# Patient Record
Sex: Male | Born: 1955 | Hispanic: No | Marital: Married | Smoking: Former smoker
Health system: Southern US, Community
[De-identification: ages and names within clinical notes are randomized; demographics above are authoritative.]

## PROBLEM LIST (undated history)

## (undated) DIAGNOSIS — K219 Gastro-esophageal reflux disease without esophagitis: Secondary | ICD-10-CM

## (undated) DIAGNOSIS — F32A Depression, unspecified: Secondary | ICD-10-CM

## (undated) DIAGNOSIS — T8859XA Other complications of anesthesia, initial encounter: Secondary | ICD-10-CM

## (undated) DIAGNOSIS — I6381 Other cerebral infarction due to occlusion or stenosis of small artery: Secondary | ICD-10-CM

## (undated) DIAGNOSIS — I618 Other nontraumatic intracerebral hemorrhage: Secondary | ICD-10-CM

## (undated) DIAGNOSIS — N2889 Other specified disorders of kidney and ureter: Secondary | ICD-10-CM

## (undated) DIAGNOSIS — F419 Anxiety disorder, unspecified: Secondary | ICD-10-CM

## (undated) DIAGNOSIS — F329 Major depressive disorder, single episode, unspecified: Secondary | ICD-10-CM

## (undated) DIAGNOSIS — E538 Deficiency of other specified B group vitamins: Secondary | ICD-10-CM

## (undated) DIAGNOSIS — T4145XA Adverse effect of unspecified anesthetic, initial encounter: Secondary | ICD-10-CM

## (undated) HISTORY — DX: Other nontraumatic intracerebral hemorrhage: I61.8

## (undated) HISTORY — PX: NECK SURGERY: SHX720

## (undated) HISTORY — DX: Deficiency of other specified B group vitamins: E53.8

## (undated) HISTORY — PX: COLONOSCOPY: SHX174

## (undated) HISTORY — DX: Other cerebral infarction due to occlusion or stenosis of small artery: I63.81

---
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
## 2019-07-18 ENCOUNTER — Other Ambulatory Visit: Payer: Self-pay

## 2019-07-18 ENCOUNTER — Ambulatory Visit: Payer: 59 | Admitting: Family Medicine

## 2019-07-18 ENCOUNTER — Encounter: Payer: Self-pay | Admitting: Family Medicine

## 2019-07-18 ENCOUNTER — Other Ambulatory Visit: Payer: 59

## 2019-07-18 ENCOUNTER — Other Ambulatory Visit: Payer: Self-pay | Admitting: *Deleted

## 2019-07-18 VITALS — BP 120/86 | HR 70 | Temp 97.0°F | Ht 72.0 in | Wt 180.0 lb

## 2019-07-18 DIAGNOSIS — E538 Deficiency of other specified B group vitamins: Secondary | ICD-10-CM

## 2019-07-18 DIAGNOSIS — D473 Essential (hemorrhagic) thrombocythemia: Secondary | ICD-10-CM

## 2019-07-18 DIAGNOSIS — R413 Other amnesia: Secondary | ICD-10-CM | POA: Diagnosis not present

## 2019-07-18 DIAGNOSIS — D75839 Thrombocytosis, unspecified: Secondary | ICD-10-CM

## 2019-07-18 DIAGNOSIS — F320 Major depressive disorder, single episode, mild: Secondary | ICD-10-CM | POA: Diagnosis not present

## 2019-07-18 DIAGNOSIS — R1310 Dysphagia, unspecified: Secondary | ICD-10-CM | POA: Diagnosis not present

## 2019-07-18 DIAGNOSIS — R1319 Other dysphagia: Secondary | ICD-10-CM

## 2019-07-18 LAB — CBC
HCT: 51.4 % (ref 39.0–52.0)
Hemoglobin: 16.5 g/dL (ref 13.0–17.0)
MCHC: 32 g/dL (ref 30.0–36.0)
MCV: 80 fl (ref 78.0–100.0)
Platelets: 912 10*3/uL — ABNORMAL HIGH (ref 150.0–400.0)
RBC: 6.43 Mil/uL — ABNORMAL HIGH (ref 4.22–5.81)
RDW: 13.5 % (ref 11.5–15.5)
WBC: 10.7 10*3/uL — ABNORMAL HIGH (ref 4.0–10.5)

## 2019-07-18 LAB — COMPREHENSIVE METABOLIC PANEL
ALT: 17 U/L (ref 0–53)
AST: 18 U/L (ref 0–37)
Albumin: 4.6 g/dL (ref 3.5–5.2)
Alkaline Phosphatase: 78 U/L (ref 39–117)
BUN: 19 mg/dL (ref 6–23)
CO2: 28 mEq/L (ref 19–32)
Calcium: 9.9 mg/dL (ref 8.4–10.5)
Chloride: 102 mEq/L (ref 96–112)
Creatinine, Ser: 1.01 mg/dL (ref 0.40–1.50)
GFR: 74.88 mL/min (ref >60.00)
Glucose, Bld: 85 mg/dL (ref 70–99)
Potassium: 4.8 mEq/L (ref 3.5–5.1)
Sodium: 139 mEq/L (ref 135–145)
Total Bilirubin: 0.6 mg/dL (ref 0.2–1.2)
Total Protein: 7.5 g/dL (ref 6.0–8.3)

## 2019-07-18 LAB — TSH: TSH: 0.55 u[IU]/mL (ref 0.35–4.50)

## 2019-07-18 LAB — T4, FREE: Free T4: 0.84 ng/dL (ref 0.60–1.60)

## 2019-07-18 LAB — VITAMIN B12: Vitamin B-12: 92 pg/mL — ABNORMAL LOW (ref 211–911)

## 2019-07-18 MED ORDER — SERTRALINE HCL 50 MG PO TABS: 50.0000 mg | ORAL_TABLET | Freq: Every day | ORAL | 3 refills | Status: DC

## 2019-07-18 NOTE — Progress Notes (Signed)
Chief Complaint  Patient presents with  . New Patient (Initial Visit)    memory problems and congestion       New Patient Visit SUBJECTIVE: HPI: Thomas Hancock is an 64 y.o.male who is being seen for establishing care.   2 years of progressive trouble swallowing in the lower neck area. Solid foods tend to get stuck. Liquids are OK. Has lost around 10-15 lbs since this started. Getting worse.   5-6 mo of memory concerns. 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 Have noticed an increase in irritability. Feeling more depressed. No hx of concussions/inj, EtOH use, drug use, former smoker. +famhx of this. No SI or HI. Stressors include work and the pandemic.   History reviewed. No pertinent past medical history.   History reviewed. No pertinent surgical history.   History reviewed. No pertinent family history. No Known Allergies  Current Outpatient Medications:  .  sertraline (ZOLOFT) 50 MG tablet, Take 1 tablet (50 mg total) by mouth daily. Take 1/2 tab daily for first 2 weeks., Disp: 30 tablet, Rfl: 3  ROS Const: Denies fevers  GI: +regurgitation   OBJECTIVE: BP 120/86 (BP Location: Left Arm, Patient Position: Sitting, Cuff Size: Normal)   Pulse 70   Temp (!) 97 F (36.1 C) (Temporal)   Ht 6' (1.829 m)   Wt 180 lb (81.6 kg)   SpO2 98%   BMI 24.41 kg/m  General:  well developed, well nourished, in no apparent distress GI: Abd S, NT, ND, BS+ Throat/Pharynx:  lips and gingiva without lesion; tongue and uvula midline; non-inflamed pharynx; no exudates or postnasal drainage Lungs:  clear to auscultation, breath sounds equal bilaterally, no respiratory distress Cardio:  regular rate and rhythm, no LE edema or bruits Musculoskeletal:  symmetrical muscle groups noted without atrophy or deformity Neuro:  gait normal Psych: well oriented with normal range of affect and appropriate  judgment/insight  ASSESSMENT/PLAN: Esophageal dysphagia - Plan: Ambulatory referral to Gastroenterology  Depression, major, single episode, mild (HCC) - Plan: sertraline (ZOLOFT) 50 MG tablet  Memory difficulty - Plan: CBC, Comprehensive metabolic panel, TSH, T4, free, B12, Ambulatory referral to Psychology  1- send to GI for possible scope and stretching.  2- Start SSRI. 25 mg/d for 2 weeks then 50 mg/d. Counseling info given. 3- Counseled on exercise. Refer to psychology for evaluation. Ck labs.  Patient should return in 6 weeks to reck. The patient and his spouse voiced understanding and agreement to the plan.   [place], DO 07/18/19  12:26 PM

## 2019-07-18 NOTE — Patient Instructions (Signed)
If you do not hear anything about your referrals in the next 1-2 weeks, call our office and ask for an update.  Keep the diet clean and stay active.  Aim to do some physical exertion for 150 minutes per week. This is typically divided into 5 days per week, 30 minutes per day. The activity should be enough to get your heart rate up. Anything is better than nothing if you have time constraints.  Please consider counseling. Contact [phone] to schedule an appointment or inquire about cost/insurance coverage.  Coping skills Choose 5 that work for you:  Take a deep breath  Count to 20  Read a book  Do a puzzle  Meditate  Bake  Sing  Knit  Garden  Pray  Go outside  Call a friend  Listen to music  Take a walk  Color  Send a note  Take a bath  Watch a movie  Be alone in a quiet place  Pet an animal  Visit a friend  Journal  Exercise  Stretch

## 2019-07-21 ENCOUNTER — Encounter: Payer: Self-pay | Admitting: Gastroenterology

## 2019-07-21 LAB — INTRINSIC FACTOR ANTIBODIES: Intrinsic Factor: UNDETERMINED — AB

## 2019-07-21 LAB — CBC (INCLUDES DIFF/PLT) WITH PATHOLOGIST REVIEW
Absolute Monocytes: 813 cells/uL (ref 200–950)
Basophils Absolute: 161 cells/uL (ref 0–200)
Basophils Relative: 1.5 %
Eosinophils Absolute: 375 cells/uL (ref 15–500)
Eosinophils Relative: 3.5 %
HCT: 50.3 % — ABNORMAL HIGH (ref 38.5–50.0)
Hemoglobin: 16.7 g/dL (ref 13.2–17.1)
Lymphs Abs: 1626 cells/uL (ref 850–3900)
MCH: 26 pg — ABNORMAL LOW (ref 27.0–33.0)
MCHC: 33.2 g/dL (ref 32.0–36.0)
MCV: 78.3 fL — ABNORMAL LOW (ref 80.0–100.0)
MPV: 8.8 fL (ref 7.5–12.5)
Monocytes Relative: 7.6 %
Neutro Abs: 7725 cells/uL (ref 1500–7800)
Neutrophils Relative %: 72.2 %
Platelets: 858 10*3/uL — ABNORMAL HIGH (ref 140–400)
RBC: 6.42 10*6/uL — ABNORMAL HIGH (ref 4.20–5.80)
RDW: 12.8 % (ref 11.0–15.0)
Total Lymphocyte: 15.2 %
WBC: 10.7 10*3/uL (ref 3.8–10.8)

## 2019-07-22 ENCOUNTER — Other Ambulatory Visit: Payer: Self-pay | Admitting: Family Medicine

## 2019-07-22 ENCOUNTER — Encounter: Payer: Self-pay | Admitting: Family Medicine

## 2019-07-22 DIAGNOSIS — D75839 Thrombocytosis, unspecified: Secondary | ICD-10-CM

## 2019-07-22 DIAGNOSIS — D473 Essential (hemorrhagic) thrombocythemia: Secondary | ICD-10-CM

## 2019-07-24 ENCOUNTER — Telehealth: Payer: Self-pay | Admitting: Hematology

## 2019-07-24 ENCOUNTER — Ambulatory Visit (INDEPENDENT_AMBULATORY_CARE_PROVIDER_SITE_OTHER): Payer: 59 | Admitting: *Deleted

## 2019-07-24 ENCOUNTER — Telehealth: Payer: Self-pay | Admitting: *Deleted

## 2019-07-24 ENCOUNTER — Other Ambulatory Visit: Payer: Self-pay

## 2019-07-24 DIAGNOSIS — E538 Deficiency of other specified B group vitamins: Secondary | ICD-10-CM

## 2019-07-24 MED ORDER — CYANOCOBALAMIN 1000 MCG/ML IJ SOLN
1000.0000 ug | Freq: Once | INTRAMUSCULAR | Status: AC
  Administered 2019-07-24: 10:00:00 1000 ug via INTRAMUSCULAR

## 2019-07-24 NOTE — Telephone Encounter (Signed)
For patient next b12 did you want him to come weekly for 4 weeks and then monthly?

## 2019-07-24 NOTE — Progress Notes (Signed)
Patient here for b12 injection per pcp.  Injection given in left deltoid and patient tolerated well.

## 2019-07-24 NOTE — Telephone Encounter (Signed)
Tentatively yes, will need to reck B12 after his 4th shot. Ty.

## 2019-07-24 NOTE — Telephone Encounter (Signed)
lmom to inform patient of new patient appointment 2/11 at 1030 am

## 2019-07-24 NOTE — Telephone Encounter (Signed)
Patient notified

## 2019-07-25 ENCOUNTER — Encounter: Payer: Self-pay | Admitting: Gastroenterology

## 2019-07-25 ENCOUNTER — Ambulatory Visit: Payer: 59 | Admitting: Gastroenterology

## 2019-07-25 VITALS — BP 134/86 | HR 74 | Temp 98.0°F | Ht 72.0 in | Wt 183.1 lb

## 2019-07-25 DIAGNOSIS — Z01818 Encounter for other preprocedural examination: Secondary | ICD-10-CM

## 2019-07-25 DIAGNOSIS — R1310 Dysphagia, unspecified: Secondary | ICD-10-CM | POA: Diagnosis not present

## 2019-07-25 DIAGNOSIS — Z1211 Encounter for screening for malignant neoplasm of colon: Secondary | ICD-10-CM

## 2019-07-25 DIAGNOSIS — R1319 Other dysphagia: Secondary | ICD-10-CM

## 2019-07-25 MED ORDER — CLENPIQ 10-3.5-12 MG-GM -GM/160ML PO SOLN: 1.0000 | Freq: Once | ORAL | 0 refills | Status: AC

## 2019-07-25 NOTE — Addendum Note (Signed)
Addended by: Kem Boroughs D on: 07/25/2019 10:15 AM   Modules accepted: Orders

## 2019-07-25 NOTE — Patient Instructions (Signed)
If you are age 62 or older, your body mass index should be between 23-30. Your Body mass index is 24.84 kg/m. If this is out of the aforementioned range listed, please consider follow up with your Primary Care Provider.  If you are age 12 or younger, your body mass index should be between 19-25. Your Body mass index is 24.84 kg/m. If this is out of the aformentioned range listed, please consider follow up with your Primary Care Provider.   You have been scheduled for an endoscopy and colonoscopy. Please follow the written instructions given to you at your visit today. Please pick up your prep supplies at the pharmacy within the next 1-3 days. If you use inhalers (even only as needed), please bring them with you on the day of your procedure. Your physician has requested that you go to www.startemmi.com and enter the access code given to you at your visit today. This web site gives a general overview about your procedure. However, you should still follow specific instructions given to you by our office regarding your preparation for the procedure.  We have given you samples of the following medication to take: Clenpiq  Thank you,  Dr. Jackquline Denmark

## 2019-07-25 NOTE — Progress Notes (Signed)
Chief Complaint:   Referring Provider:  Shelda Pal*      ASSESSMENT AND PLAN;   #1. Eso dysphagia. D/d includes eso stricture, Schatzki's ring, motility disorder, eosinophilic esophagitis, pill induced esophagitis, r/o esophageal Ca or extrinsic lesions.  #2. Colorectal cancer screening  #3. B12 def on B12 shots, thrombocytosis, low MCV ?  Etiology-has appointment with hematology.  Plan: - Proceed with EGD with dil (if needed)/colon. Discussed risks & benefits. (Risks including rare perforation req laparotomy, bleeding after bx/polypectomy req blood transfusion, rarely missing neoplasms, risks of anesthesia/sedation). Benefits outweigh the risks. Patient agrees to proceed. All the questions were answered. Consent forms given for review. - Continue B12 shots for now. - Encouraged him to keep hematology appointment.   HPI:    Thomas Hancock is a 64 y.o. male  Accompanied by his wife Has been having dysphagia mostly to solids, mid chest, x 6 to 8 years, getting somewhat worse over the last 6 months.  Would always have cough and would bring up phlegm after eating.  This is a problem with the whole family-including his brother and father. No heartburn No odynophagia No weight loss  Denies having any diarrhea or constipation.  No fever chills or night sweats.  Has never been to a doctor in 39 years.  Was seen by Dr. Nani Ravens to establish care.  Labs revealed mildly increased WBC count at 10.7, platelets 912K with normal hemoglobin at 16.5.  Normal CMP.  Had B12 deficiency.  Has appointment with hematology to rule out myeloproliferative disorder.  MCV 80.  Repeat CBC revealed MCV 78.3.  Platelet count was still elevated at 858 K.  LFTs were normal with normal albumin 4.6.  For now has been advised to get EGD and colonoscopy performed    Family History  Problem Relation Age of Onset  . Colon cancer Neg Hx   . Esophageal cancer Neg Hx     Social History    Tobacco Use  . Smoking status: Former Smoker    Types: Cigarettes  . Smokeless tobacco: Never Used  . Tobacco comment: quit over 20 years ago  Substance Use Topics  . Alcohol use: Never  . Drug use: Never    Current Outpatient Medications  Medication Sig Dispense Refill  . sertraline (ZOLOFT) 50 MG tablet Take 1 tablet (50 mg total) by mouth daily. Take 1/2 tab daily for first 2 weeks. 30 tablet 3   No current facility-administered medications for this visit.    No Known Allergies  Review of Systems:  Constitutional: Denies fever, chills, diaphoresis, appetite change and fatigue.  HEENT: Denies photophobia, eye pain, redness, hearing loss, ear pain, congestion, sore throat, rhinorrhea, sneezing, mouth sores, neck pain, neck stiffness and tinnitus.   Respiratory: Denies SOB, DOE, cough, chest tightness,  and wheezing.   Cardiovascular: Denies chest pain, palpitations and leg swelling.  Genitourinary: Denies dysuria, urgency, frequency, hematuria, flank pain and difficulty urinating.  Musculoskeletal: Denies myalgias, back pain, joint swelling, arthralgias and gait problem.  Skin: No rash.  Neurological: Denies dizziness, seizures, syncope, weakness, light-headedness, numbness and headaches.  Hematological: Denies adenopathy. Easy bruising, personal or family bleeding history  Psychiatric/Behavioral: Has anxiety or depression     Physical Exam:    BP 134/86   Pulse 74   Temp 98 F (36.7 C)   Ht 6' (1.829 m)   Wt 183 lb 2 oz (83.1 kg)   BMI 24.84 kg/m  Wt Readings from Last 3 Encounters:  07/25/19 183  lb 2 oz (83.1 kg)  07/18/19 180 lb (81.6 kg)   Constitutional:  Well-developed, in no acute distress. Psychiatric: Normal mood and affect. Behavior is normal. HEENT: Pupils normal.  Conjunctivae are normal. No scleral icterus. Neck supple.  Cardiovascular: Normal rate, regular rhythm. No edema Pulmonary/chest: Effort normal and breath sounds normal. No wheezing,  rales or rhonchi. Abdominal: Soft, nondistended. Nontender. Bowel sounds active throughout. There are no masses palpable. No hepatomegaly. Rectal:  defered Neurological: Alert and oriented to person place and time. Skin: Skin is warm and dry. No rashes noted.  Data Reviewed: I have personally reviewed following labs and imaging studies  CBC: CBC Latest Ref Rng & Units 07/18/2019 07/18/2019  WBC 3.8 - 10.8 Thousand/uL 10.7 10.7(H)  Hemoglobin 13.2 - 17.1 g/dL 16.7 16.5  Hematocrit 38.5 - 50.0 % 50.3(H) 51.4  Platelets 140 - 400 Thousand/uL 858(H) 912.0(H)    CMP: CMP Latest Ref Rng & Units 07/18/2019  Glucose 70 - 99 mg/dL 85  BUN 6 - 23 mg/dL 19  Creatinine 0.40 - 1.50 mg/dL 1.01  Sodium 135 - 145 mEq/L 139  Potassium 3.5 - 5.1 mEq/L 4.8  Chloride 96 - 112 mEq/L 102  CO2 19 - 32 mEq/L 28  Calcium 8.4 - 10.5 mg/dL 9.9  Total Protein 6.0 - 8.3 g/dL 7.5  Total Bilirubin 0.2 - 1.2 mg/dL 0.6  Alkaline Phos 39 - 117 U/L 78  AST 0 - 37 U/L 18  ALT 0 - 53 U/L 17    GFR: Estimated Creatinine Clearance: 84.3 mL/min (by C-G formula based on SCr of 1.01 mg/dL). Liver Function Tests: Recent Labs  Lab 07/18/19 1340  AST 18  ALT 17  ALKPHOS 78  BILITOT 0.6  PROT 7.5  ALBUMIN 4.6      Carmell Austria, MD 07/25/2019, 9:13 AM  Cc: Shelda Pal*

## 2019-07-25 NOTE — Addendum Note (Signed)
Addended by: Kem Boroughs D on: 07/25/2019 10:11 AM   Modules accepted: Orders

## 2019-07-28 HISTORY — PX: COLONOSCOPY WITH ESOPHAGOGASTRODUODENOSCOPY (EGD): SHX5779

## 2019-07-29 ENCOUNTER — Ambulatory Visit (INDEPENDENT_AMBULATORY_CARE_PROVIDER_SITE_OTHER): Payer: 59

## 2019-07-29 ENCOUNTER — Other Ambulatory Visit: Payer: Self-pay

## 2019-07-29 DIAGNOSIS — Z1159 Encounter for screening for other viral diseases: Secondary | ICD-10-CM

## 2019-07-31 ENCOUNTER — Other Ambulatory Visit: Payer: Self-pay

## 2019-07-31 ENCOUNTER — Encounter: Payer: Self-pay | Admitting: Gastroenterology

## 2019-07-31 ENCOUNTER — Ambulatory Visit (INDEPENDENT_AMBULATORY_CARE_PROVIDER_SITE_OTHER): Payer: 59

## 2019-07-31 ENCOUNTER — Ambulatory Visit (AMBULATORY_SURGERY_CENTER): Payer: 59 | Admitting: Gastroenterology

## 2019-07-31 VITALS — BP 129/87 | HR 67 | Temp 97.8°F | Resp 14 | Ht 72.0 in | Wt 183.0 lb

## 2019-07-31 DIAGNOSIS — D124 Benign neoplasm of descending colon: Secondary | ICD-10-CM | POA: Diagnosis not present

## 2019-07-31 DIAGNOSIS — Z1211 Encounter for screening for malignant neoplasm of colon: Secondary | ICD-10-CM | POA: Diagnosis not present

## 2019-07-31 DIAGNOSIS — D123 Benign neoplasm of transverse colon: Secondary | ICD-10-CM | POA: Diagnosis not present

## 2019-07-31 DIAGNOSIS — K219 Gastro-esophageal reflux disease without esophagitis: Secondary | ICD-10-CM | POA: Diagnosis not present

## 2019-07-31 DIAGNOSIS — R1310 Dysphagia, unspecified: Secondary | ICD-10-CM | POA: Diagnosis present

## 2019-07-31 DIAGNOSIS — E538 Deficiency of other specified B group vitamins: Secondary | ICD-10-CM

## 2019-07-31 DIAGNOSIS — K2950 Unspecified chronic gastritis without bleeding: Secondary | ICD-10-CM | POA: Diagnosis not present

## 2019-07-31 DIAGNOSIS — B9681 Helicobacter pylori [H. pylori] as the cause of diseases classified elsewhere: Secondary | ICD-10-CM

## 2019-07-31 DIAGNOSIS — K2970 Gastritis, unspecified, without bleeding: Secondary | ICD-10-CM | POA: Diagnosis not present

## 2019-07-31 MED ORDER — PANTOPRAZOLE SODIUM 40 MG PO TBEC: 40.0000 mg | DELAYED_RELEASE_TABLET | Freq: Every day | ORAL | 3 refills | Status: DC

## 2019-07-31 MED ORDER — SODIUM CHLORIDE 0.9 % IV SOLN: 500.0000 mL | Freq: Once | INTRAVENOUS | Status: DC

## 2019-07-31 MED ORDER — CYANOCOBALAMIN 1000 MCG/ML IJ SOLN
1000.0000 ug | Freq: Once | INTRAMUSCULAR | Status: AC
  Administered 2019-07-31: 1000 ug via INTRAMUSCULAR

## 2019-07-31 NOTE — Patient Instructions (Signed)
HANDOUTS PROVIDED ON: POST DILATION DIET, GASTRITIS, POLYPS, DIVERTICULOSIS, & HEMORRHOIDS  The polyps removed/biopsies taken today have been sent for pathology.  The results can take 1-3 weeks to receive.  When your next colonoscopy should occur will be based on the pathology results.    Follow the post dilation diet today and you may resume your previous diet tomorrow.  You may resume your medication schedule.  Begin taking Protonix (pantoprazole) 40mg  every day.  NO ASPIRIN, IBUPROFEN  Thank you for allowing [place] to care for you today!!!   YOU HAD AN ENDOSCOPIC PROCEDURE TODAY AT [facility]:   Refer to the procedure report that was given to you for any specific questions about what was found during the examination.  If the procedure report does not answer your questions, please call your gastroenterologist to clarify.  If you requested that your care partner not be given the details of your procedure findings, then the procedure report has been included in a sealed envelope for you to review at your convenience later.  YOU SHOULD EXPECT: Some feelings of bloating in the abdomen. Passage of more gas than usual.  Walking can help get rid of the air that was put into your GI tract during the procedure and reduce the bloating. If you had a lower endoscopy (such as a colonoscopy or flexible sigmoidoscopy) you may notice spotting of blood in your stool or on the toilet paper. If you underwent a bowel prep for your procedure, you may not have a normal bowel movement for a few days.  Please Note:  You might notice some irritation and congestion in your nose or some drainage.  This is from the oxygen used during your procedure.  There is no need for concern and it should clear up in a day or so.  SYMPTOMS TO REPORT IMMEDIATELY:   Following lower endoscopy (colonoscopy or flexible sigmoidoscopy):  Excessive amounts of blood in the stool  Significant tenderness or worsening of  abdominal pains  Swelling of the abdomen that is new, acute  Fever of 100F or higher   Following upper endoscopy (EGD)  Vomiting of blood or coffee ground material  New chest pain or pain under the shoulder blades  Painful or persistently difficult swallowing  New shortness of breath  Fever of 100F or higher  Black, tarry-looking stools  For urgent or emergent issues, a gastroenterologist can be reached at any hour by calling [phone].   DIET:  We do recommend you follow the post dilation diet for today and tomorrow you can start with a small meal at first, but then you may proceed to your regular diet.  Drink plenty of fluids but you should avoid alcoholic beverages for 24 hours.  ACTIVITY:  You should plan to take it easy for the rest of today and you should NOT DRIVE or use heavy machinery until tomorrow (because of the sedation medicines used during the test).    FOLLOW UP: Our staff will call the number listed on your records 48-72 hours following your procedure to check on you and address any questions or concerns that you may have regarding the information given to you following your procedure. If we do not reach you, we will leave a message.  We will attempt to reach you two times.  During this call, we will ask if you have developed any symptoms of COVID 19. If you develop any symptoms (ie: fever, flu-like symptoms, shortness of breath, cough etc.) before then,  please call [phone].  If you test positive for Covid 19 in the 2 weeks post procedure, please call and report this information to [place].    If any biopsies were taken you will be contacted by phone or by letter within the next 1-3 weeks.  Please call us at [phone] if you have not heard about the biopsies in 3 weeks.    SIGNATURES/CONFIDENTIALITY: You and/or your care partner have signed paperwork which will be entered into your electronic medical record.  These signatures attest to the fact that that the  information above on your After Visit Summary has been reviewed and is understood.  Full responsibility of the confidentiality of this discharge information lies with you and/or your care-partner.

## 2019-07-31 NOTE — Op Note (Signed)
[facility] Patient Name: Thomas Hancock Procedure Date: 07/31/2019 1:18 PM MRN: [id number] Endoscopist: Jackquline Denmark , MD Age: 64 Referring MD:  Date of Birth: January 31, 1956 Gender: Male Account #: [id number] Procedure:                Colonoscopy Indications:              Screening for colorectal malignant neoplasm Medicines:                Monitored Anesthesia Care Procedure:                Pre-Anesthesia Assessment:                           - Prior to the procedure, a History and Physical                            was performed, and patient medications and                            allergies were reviewed. The patient's tolerance of                            previous anesthesia was also reviewed. The risks                            and benefits of the procedure and the sedation                            options and risks were discussed with the patient.                            All questions were answered, and informed consent                            was obtained. Prior Anticoagulants: The patient has                            taken no previous anticoagulant or antiplatelet                            agents. ASA Grade Assessment: II - A patient with                            mild systemic disease. After reviewing the risks                            and benefits, the patient was deemed in                            satisfactory condition to undergo the procedure.                           After obtaining informed consent, the colonoscope  was passed under direct vision. Throughout the                            procedure, the patient's blood pressure, pulse, and                            oxygen saturations were monitored continuously. The                            Colonoscope was introduced through the anus and                            advanced to the 2 cm into the ileum. The                            colonoscopy was performed  without difficulty. The                            patient tolerated the procedure well. The quality                            of the bowel preparation was good except in the                            right colon where there was adherent stool which                            could not be fully washed. Overall the examination                            was adequate. The terminal ileum, ileocecal valve,                            appendiceal orifice, and rectum were photographed. Scope In: 1:31:50 PM Scope Out: 1:47:46 PM Scope Withdrawal Time: 0 hours 13 minutes 50 seconds  Total Procedure Duration: 0 hours 15 minutes 56 seconds  Findings:                 Five sessile polyps were found in the sigmoid colon                            (1), mid-descending colon(2) and mid transverse                            colon(2). The polyps were 4 to 6 mm in size. These                            polyps were removed with a cold snare. Resection                            and retrieval were complete. Estimated blood loss:  none.                           A 10 mm polyp was found in the mid sigmoid colon,                            35 cm from the anal verge. The polyp was                            semi-pedunculated. The polyp was removed with a hot                            snare. Resection and retrieval were complete.                           A few small-mouthed diverticula were found in the                            sigmoid colon, descending colon and ascending colon.                           Non-bleeding internal hemorrhoids were found during                            retroflexion. The hemorrhoids were moderate.                           The terminal ileum appeared normal.                           The exam was otherwise without abnormality on                            direct and retroflexion views. Complications:            No immediate complications. Estimated  Blood Loss:     Estimated blood loss: none. Impression:               -Colonic polyps s/p polypectomy.                           -Mild pancolonic diverticulosis.                           -Non-bleeding internal hemorrhoids.                           -Otherwise normal colonoscopy to TI. Recommendation:           - Patient has a contact number available for                            emergencies. The signs and symptoms of potential                            delayed complications were discussed with the  patient. Return to normal activities tomorrow.                            Written discharge instructions were provided to the                            patient.                           - Resume previous diet.                           - Continue present medications.                           - Await pathology results.                           - Repeat colonoscopy for surveillance based on                            pathology results.                           - The findings and recommendations were discussed                            with the patient's family. Jackquline Denmark, MD 07/31/2019 2:17:15 PM This report has been signed electronically.

## 2019-07-31 NOTE — Op Note (Signed)
[facility] Patient Name: Thomas Hancock Procedure Date: 07/31/2019 1:18 PM MRN: [id number] Endoscopist: Jackquline Denmark , MD Age: 64 Referring MD:  Date of Birth: Feb 20, 1956 Gender: Male Account #: [id number] Procedure:                Upper GI endoscopy Indications:              Epigastric abdominal pain Medicines:                Monitored Anesthesia Care Procedure:                Pre-Anesthesia Assessment:                           - Prior to the procedure, a History and Physical                            was performed, and patient medications and                            allergies were reviewed. The patient's tolerance of                            previous anesthesia was also reviewed. The risks                            and benefits of the procedure and the sedation                            options and risks were discussed with the patient.                            All questions were answered, and informed consent                            was obtained. Prior Anticoagulants: The patient has                            taken no previous anticoagulant or antiplatelet                            agents. ASA Grade Assessment: II - A patient with                            mild systemic disease. After reviewing the risks                            and benefits, the patient was deemed in                            satisfactory condition to undergo the procedure.                           After obtaining informed consent, the endoscope was  passed under direct vision. Throughout the                            procedure, the patient's blood pressure, pulse, and                            oxygen saturations were monitored continuously. The                            Endoscope was introduced through the mouth, and                            advanced to the second part of duodenum. The upper                            GI endoscopy was accomplished  without difficulty.                            The patient tolerated the procedure well. Scope In: Scope Out: Findings:                 The examined esophagus was normal. Z-line was                            well-defined at 36 cm. Examined by NBI. Multiple                            biopsies were obtained from proximal, mid and                            distal esophagus to rule out EoE. Due to presence                            of dysphagia, we elected to dilate the esophagus.                            Esophagus was dilated using a Dilkon.                            The patient tolerated the procedure well.                           Diffuse moderate inflammation characterized by                            erythema with friability was found throughout the                            stomach, most prominent in gastric body and in the                            gastric antrum. Biopsies were taken with a cold  forceps for histology.                           The examined duodenum was normal. Biopsies for                            histology were taken with a cold forceps for                            evaluation of celiac disease. Complications:            No immediate complications. Estimated Blood Loss:     Estimated blood loss: none. Impression:               -Moderate gastritis.                           -S/P empiric esophageal dilatation. Recommendation:           - Patient has a contact number available for                            emergencies. The signs and symptoms of potential                            delayed complications were discussed with the                            patient. Return to normal activities tomorrow.                            Written discharge instructions were provided to the                            patient.                           - Post dilatation diet.                           - Continue present  medications.                           - No aspirin, ibuprofen, naproxen, or other                            non-steroidal anti-inflammatory drugs.                           - Await pathology results.                           - Use Protonix (pantoprazole) 40 mg PO daily, 30,                            with 6 refills.                           -  Return to GI office in 8 weeks. Jackquline Denmark, MD 07/31/2019 2:06:00 PM This report has been signed electronically.

## 2019-07-31 NOTE — Progress Notes (Signed)
A and O x3. Report to RN. Tolerated MAC anesthesia well.Teeth unchanged after procedure.

## 2019-08-04 ENCOUNTER — Telehealth: Payer: Self-pay

## 2019-08-04 NOTE — Telephone Encounter (Signed)
First post procedure follow up procedure, no answer 

## 2019-08-04 NOTE — Telephone Encounter (Signed)
  Follow up Call-  Call back number 07/31/2019  Post procedure Call Back phone  # [phone]  Permission to leave phone message Yes     Patient questions:  Do you have a fever, pain , or abdominal swelling? No. Pain Score  0 *  Have you tolerated food without any problems? Yes.    Have you been able to return to your normal activities? Yes.    Do you have any questions about your discharge instructions: Diet   No. Medications  No. Follow up visit  No.  Do you have questions or concerns about your Care? No.  Actions: * If pain score is 4 or above: 1. No action needed, pain <4.Have you developed a fever since your procedure? no  2.   Have you had an respiratory symptoms (SOB or cough) since your procedure? no  3.   Have you tested positive for COVID 19 since your procedure no  4.   Have you had any family members/close contacts diagnosed with the COVID 19 since your procedure?  no   If yes to any of these questions please route to Joylene John, RN and Alphonsa Gin, Therapist, sports.

## 2019-08-06 ENCOUNTER — Other Ambulatory Visit: Payer: Self-pay | Admitting: Family

## 2019-08-06 DIAGNOSIS — D473 Essential (hemorrhagic) thrombocythemia: Secondary | ICD-10-CM

## 2019-08-06 DIAGNOSIS — D75839 Thrombocytosis, unspecified: Secondary | ICD-10-CM

## 2019-08-06 DIAGNOSIS — Z1589 Genetic susceptibility to other disease: Secondary | ICD-10-CM

## 2019-08-07 ENCOUNTER — Inpatient Hospital Stay: Payer: 59 | Attending: Family | Admitting: Family

## 2019-08-07 ENCOUNTER — Ambulatory Visit (INDEPENDENT_AMBULATORY_CARE_PROVIDER_SITE_OTHER): Payer: 59

## 2019-08-07 ENCOUNTER — Encounter: Payer: Self-pay | Admitting: Family

## 2019-08-07 ENCOUNTER — Inpatient Hospital Stay: Payer: 59

## 2019-08-07 ENCOUNTER — Encounter: Payer: Self-pay | Admitting: Gastroenterology

## 2019-08-07 ENCOUNTER — Other Ambulatory Visit: Payer: Self-pay

## 2019-08-07 VITALS — BP 123/79 | HR 73 | Temp 97.3°F | Resp 18 | Ht 72.0 in | Wt 179.0 lb

## 2019-08-07 DIAGNOSIS — Z79899 Other long term (current) drug therapy: Secondary | ICD-10-CM | POA: Insufficient documentation

## 2019-08-07 DIAGNOSIS — E538 Deficiency of other specified B group vitamins: Secondary | ICD-10-CM

## 2019-08-07 DIAGNOSIS — Z1589 Genetic susceptibility to other disease: Secondary | ICD-10-CM

## 2019-08-07 DIAGNOSIS — R161 Splenomegaly, not elsewhere classified: Secondary | ICD-10-CM

## 2019-08-07 DIAGNOSIS — R7989 Other specified abnormal findings of blood chemistry: Secondary | ICD-10-CM | POA: Insufficient documentation

## 2019-08-07 DIAGNOSIS — D509 Iron deficiency anemia, unspecified: Secondary | ICD-10-CM

## 2019-08-07 DIAGNOSIS — D75839 Thrombocytosis, unspecified: Secondary | ICD-10-CM

## 2019-08-07 DIAGNOSIS — F17210 Nicotine dependence, cigarettes, uncomplicated: Secondary | ICD-10-CM | POA: Diagnosis not present

## 2019-08-07 DIAGNOSIS — D45 Polycythemia vera: Secondary | ICD-10-CM | POA: Diagnosis present

## 2019-08-07 DIAGNOSIS — K7460 Unspecified cirrhosis of liver: Secondary | ICD-10-CM | POA: Diagnosis not present

## 2019-08-07 DIAGNOSIS — D473 Essential (hemorrhagic) thrombocythemia: Secondary | ICD-10-CM

## 2019-08-07 LAB — CBC WITH DIFFERENTIAL (CANCER CENTER ONLY)
Abs Immature Granulocytes: 0.08 10*3/uL — ABNORMAL HIGH (ref 0.00–0.07)
Basophils Absolute: 0.2 10*3/uL — ABNORMAL HIGH (ref 0.0–0.1)
Basophils Relative: 1 %
Eosinophils Absolute: 0.4 10*3/uL (ref 0.0–0.5)
Eosinophils Relative: 3 %
HCT: 53 % — ABNORMAL HIGH (ref 39.0–52.0)
Hemoglobin: 16.9 g/dL (ref 13.0–17.0)
Immature Granulocytes: 1 %
Lymphocytes Relative: 10 %
Lymphs Abs: 1.2 10*3/uL (ref 0.7–4.0)
MCH: 25.3 pg — ABNORMAL LOW (ref 26.0–34.0)
MCHC: 31.9 g/dL (ref 30.0–36.0)
MCV: 79.2 fL — ABNORMAL LOW (ref 80.0–100.0)
Monocytes Absolute: 0.8 10*3/uL (ref 0.1–1.0)
Monocytes Relative: 6 %
Neutro Abs: 9.3 10*3/uL — ABNORMAL HIGH (ref 1.7–7.7)
Neutrophils Relative %: 79 %
Platelet Count: 839 10*3/uL — ABNORMAL HIGH (ref 150–400)
RBC: 6.69 MIL/uL — ABNORMAL HIGH (ref 4.22–5.81)
RDW: 13.4 % (ref 11.5–15.5)
WBC Count: 11.8 10*3/uL — ABNORMAL HIGH (ref 4.0–10.5)
nRBC: 0 % (ref 0.0–0.2)

## 2019-08-07 LAB — RETICULOCYTES
Immature Retic Fract: 6 % (ref 2.3–15.9)
RBC.: 6.69 MIL/uL — ABNORMAL HIGH (ref 4.22–5.81)
Retic Count, Absolute: 64.9 10*3/uL (ref 19.0–186.0)
Retic Ct Pct: 1 % (ref 0.4–3.1)

## 2019-08-07 LAB — CMP (CANCER CENTER ONLY)
ALT: 15 U/L (ref 0–44)
AST: 15 U/L (ref 15–41)
Albumin: 4.6 g/dL (ref 3.5–5.0)
Alkaline Phosphatase: 71 U/L (ref 38–126)
Anion gap: 8 (ref 5–15)
BUN: 18 mg/dL (ref 8–23)
CO2: 28 mmol/L (ref 22–32)
Calcium: 9.3 mg/dL (ref 8.9–10.3)
Chloride: 105 mmol/L (ref 98–111)
Creatinine: 1 mg/dL (ref 0.61–1.24)
GFR, Est AFR Am: >60 mL/min (ref >60)
GFR, Estimated: >60 mL/min (ref >60)
Glucose, Bld: 127 mg/dL — ABNORMAL HIGH (ref 70–99)
Potassium: 3.7 mmol/L (ref 3.5–5.1)
Sodium: 141 mmol/L (ref 135–145)
Total Bilirubin: 0.7 mg/dL (ref 0.3–1.2)
Total Protein: 7.4 g/dL (ref 6.5–8.1)

## 2019-08-07 LAB — LACTATE DEHYDROGENASE: LDH: 245 U/L — ABNORMAL HIGH (ref 98–192)

## 2019-08-07 LAB — SAVE SMEAR(SSMR), FOR PROVIDER SLIDE REVIEW

## 2019-08-07 MED ORDER — CYANOCOBALAMIN 1000 MCG/ML IJ SOLN
1000.0000 ug | Freq: Once | INTRAMUSCULAR | Status: AC
  Administered 2019-08-07: 1000 ug via INTRAMUSCULAR

## 2019-08-07 NOTE — Progress Notes (Signed)
Bre can you let the patient know gastric biopsies are positive for H pylori. Would recommend the following treatment regimen for 14 days: Amoxicillin 1gm BID Clarithromycin 500mg  BID Flagyl 500mg  BID Protonix 40 mg BID, then continue Protonix 40 mg p.o. once a day for 2 more weeks.  Can you please order this if no signfiicant interactions noted. Once done with therapy, the patient should wait one month and perform a stool study for H pylori antigen to ensure negative. The PPI needs to be held at least 2 weeks prior to submitting the stool sample. The patient should avoid alcohol while taking Flagyl.   Thanks  Dr Lyndel Safe

## 2019-08-07 NOTE — Progress Notes (Signed)
Hematology/Oncology Consultation   Name: Thomas Hancock      MRN: [id number]    Location: Room/bed info not found  Date: 08/07/2019 Time:11:24 AM   REFERRING PHYSICIAN:  Hart Carwin P. Wendling, DO  REASON FOR CONSULT: Thrombocytosis    DIAGNOSIS: Polycythemia, JAK-2 pending  HISTORY OF PRESENT ILLNESS: Thomas Hancock is a very pleasant 64 yo [name] who, until the last month or so, had never been to the doctor. His wife is with him today.  He had lab work in January that revealed elevated WBC count (10.7), Hgb (16.5) and platelets (912).  Platelet count today is 839, WBC count 11.8 and Hgb 16.9.  No fatigue at this time.  He states that he does have some lower back  And right hip pain that comes and goes.  He states that a paternal uncle had "blood cancer". No other cancer history in the family.  He quit smoking 2-3 cigarettes a day over 20 years ago.  He does not drink alcohol or use recreational drugs.  He started Zoloft for anxiety and depression several weeks ago and states that he has not seen much of a difference yet.  He had an endoscopy and colonoscopy last week. He had been having difficulty swallowing and they were able to dilate his esophagus. He had a benign polyp removed and was found to have chronic gastritis with H. Pylori. He started Protonix daily this week.  No fever, chills, n/v, cough, rash, dizziness, SOB, chest pain, palpitations, abdominal pain or changes in bowel or bladder habits.  No lymphadenopathy noted on exam.  No episodes of bleeding. No bruising or petechiae.  No swelling, tenderness, numbness or tingling in his extremities.  No falls or syncope.  He has maintained a good appetite and is staying well hydrated. His weight is stable.  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  As mentioned above, he is originally from [place] and has lived in the [place] now for more than 20 years.   ROS: All other 10 point review of systems is negative.    PAST MEDICAL HISTORY:   Past Medical History:  Diagnosis Date  . Vitamin B12 deficiency     ALLERGIES: No Known Allergies    MEDICATIONS:  Current Outpatient Medications on File Prior to Visit  Medication Sig Dispense Refill  . acetaminophen (TYLENOL) 325 MG tablet Take 650 mg by mouth every 6 (six) hours as needed.    . pantoprazole (PROTONIX) 40 MG tablet Take 1 tablet (40 mg total) by mouth daily. 30 tablet 3  . sertraline (ZOLOFT) 50 MG tablet Take 1 tablet (50 mg total) by mouth daily. Take 1/2 tab daily for first 2 weeks. 30 tablet 3   No current facility-administered medications on file prior to visit.     PAST SURGICAL HISTORY No past surgical history on file.  FAMILY HISTORY: Family History  Problem Relation Age of Onset  . Colon cancer Neg Hx   . Esophageal cancer Neg Hx     SOCIAL HISTORY:  reports that he has quit smoking. His smoking use included cigarettes. He has never used smokeless tobacco. He reports that he does not drink alcohol or use drugs.  PERFORMANCE STATUS: The patient's performance status is 1 - Symptomatic but completely ambulatory  PHYSICAL EXAM: Most Recent Vital Signs: Blood pressure 123/79, pulse 73, temperature (!) 97.3 F (36.3 C), temperature source Temporal, resp. rate 18, height 6' (1.829 m), weight 179 lb (81.2 kg), SpO2  100 %. BP 123/79 (BP Location: Left Arm, Patient Position: Sitting)   Pulse 73   Temp (!) 97.3 F (36.3 C) (Temporal)   Resp 18   Ht 6' (1.829 m)   Wt 179 lb (81.2 kg)   SpO2 100%   BMI 24.28 kg/m   General Appearance:    Alert, cooperative, no distress, appears stated age  Head:    Normocephalic, without obvious abnormality, atraumatic  Eyes:    PERRL, conjunctiva/corneas clear, EOM's intact, fundi    benign, both eyes             Throat:   Lips, mucosa, and tongue normal; teeth and gums normal  Neck:   Supple, symmetrical, trachea midline, no adenopathy;       thyroid:  No  enlargement/tenderness/nodules; no carotid   bruit or JVD  Back:     Symmetric, no curvature, ROM normal, no CVA tenderness  Lungs:     Clear to auscultation bilaterally, respirations unlabored  Chest wall:    No tenderness or deformity  Heart:    Regular rate and rhythm, S1 and S2 normal, no murmur, rub   or gallop  Abdomen:     Soft, non-tender, bowel sounds active all four quadrants,    no masses, no organomegaly        Extremities:   Extremities normal, atraumatic, no cyanosis or edema  Pulses:   2+ and symmetric all extremities  Skin:   Skin color, texture, turgor normal, no rashes or lesions  Lymph nodes:   Cervical, supraclavicular, and axillary nodes normal  Neurologic:   CNII-XII intact. Normal strength, sensation and reflexes      throughout    LABORATORY DATA:  Results for orders placed or performed in visit on 08/07/19 (from the past 48 hour(s))  CBC with Differential ([place] Only)     Status: Abnormal   Collection Time: 08/07/19 10:32 AM  Result Value Ref Range   WBC Count 11.8 (H) 4.0 - 10.5 K/uL   RBC 6.69 (H) 4.22 - 5.81 MIL/uL   Hemoglobin 16.9 13.0 - 17.0 g/dL   HCT 53.0 (H) 39.0 - 52.0 %   MCV 79.2 (L) 80.0 - 100.0 fL   MCH 25.3 (L) 26.0 - 34.0 pg   MCHC 31.9 30.0 - 36.0 g/dL   RDW 13.4 11.5 - 15.5 %   Platelet Count 839 (H) 150 - 400 K/uL   nRBC 0.0 0.0 - 0.2 %   Neutrophils Relative % 79 %   Neutro Abs 9.3 (H) 1.7 - 7.7 K/uL   Lymphocytes Relative 10 %   Lymphs Abs 1.2 0.7 - 4.0 K/uL   Monocytes Relative 6 %   Monocytes Absolute 0.8 0.1 - 1.0 K/uL   Eosinophils Relative 3 %   Eosinophils Absolute 0.4 0.0 - 0.5 K/uL   Basophils Relative 1 %   Basophils Absolute 0.2 (H) 0.0 - 0.1 K/uL   Immature Granulocytes 1 %   Abs Immature Granulocytes 0.08 (H) 0.00 - 0.07 K/uL    Comment: Performed at [facility] at [facility], [address] [place], [place] [address]  Save [facility])     Status: None   Collection Time:  08/07/19 10:32 AM  Result Value Ref Range   Smear Review SMEAR STAINED AND AVAILABLE FOR REVIEW     Comment: Performed at [facility] at [facility], [address], [place], [place] [address]  Reticulocytes  Status: Abnormal   Collection Time: 08/07/19 10:33 AM  Result Value Ref Range   Retic Ct Pct 1.0 0.4 - 3.1 %   RBC. 6.69 (H) 4.22 - 5.81 MIL/uL   Retic Count, Absolute 64.9 19.0 - 186.0 K/uL   Immature Retic Fract 6.0 2.3 - 15.9 %    Comment: Performed at [facility] at [facility], [address] [place], [place] [address]      RADIOGRAPHY: No results found.     PATHOLOGY: None  ASSESSMENT/PLAN: Mr. Coutts is a very pleasant 64 yo [name] with recent elevated WBC, Hgb and platelet counts at his first PCP visit. This does appear to be Polycythemia, JAK2 is pending along with iron studies.  We will go ahead and get him onto Hydrea 500 mg PO BID as well as 1 baby aspirin PO daily.  We will also get an US of the abdomen to evaluate for cirrhosis and/or splenomegaly.  We discussed Polycythemia as well as Hydrea with the patient and his wife in detail and encouraged them to call with any questions they may come up with later.   All questions at this time were answered and they are in agreement with the plan. They will contact our office with any concerns. We can certainly see him sooner if needed.   He was discussed with and also seen by Dr. Marin Olp and he is in agreement with the aforementioned.   Laverna Peace, NP    Addendum: I saw and examined Mr. Berish with Judson Roch.  I agree with the above assessment.  I looked at his peripheral blood smear under the microscope.  He had no nucleated red blood cells.  He had a marked increase in platelets.  He had numerous large platelets.  They were well granulated.  He had a slightly increased number of white blood cells.  I do not see any blasts.  I have to  believe that we are dealing with a myeloproliferative disorder.  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  I am sure that he is probably had this for a couple years.  Again he is never been to a doctor.  We do need to get an ultrasound of his abdomen to make sure there is no splenomegaly.  If he does have splenomegaly, it would not surprise me.  We really have to get his marrow to slow down a little bit.  As such, I will put him on Hydrea.  We will put him on 500 mg p.o. twice daily of Hydrea.  I think this would be reasonable to start him on.  Clearly, the JAK 2 analysis will be critical.  I have to believe that he will be JAK2 positive.  Given that we have so far, I really do not think he needs a bone marrow biopsy to be done.  By his low MCV, I suspect he is probably going to be iron deficient.  We spoke to he and his wife for about 45 minutes or so.  They are very very nice.  They are originally from [place].  [place] have been [place] country [place] over [place] years.  I told him that what we are dealing with we will not cure but we can certainly treat effectively and he can just live with it.  I also told him to make sure he takes a baby aspirin a day.  This is 81 mg of aspirin.  I told him to take this in the morning with breakfast.  He understands this.  I would like to see him back in about 4 weeks.  Hopefully, we will see his blood counts improving.  If not, we may have to get him on a phlebotomy program.  We may have to increase his Hydrea.  Again, the JAK 2 mutation will be critical.   Lattie Haw, MD

## 2019-08-07 NOTE — Progress Notes (Addendum)
Pt here for monthly B12 injection per Dr.Wendling  B12 1078mcg given IM, and pt tolerated injection well.  Next B12 injection scheduled for next week.  Noted. Agree with above.  [place], DO 08/13/19 7:40 PM

## 2019-08-08 ENCOUNTER — Other Ambulatory Visit: Payer: Self-pay

## 2019-08-08 DIAGNOSIS — A048 Other specified bacterial intestinal infections: Secondary | ICD-10-CM

## 2019-08-08 LAB — IRON AND TIBC
Iron: 45 ug/dL (ref 42–163)
Saturation Ratios: 11 % — ABNORMAL LOW (ref 20–55)
TIBC: 404 ug/dL (ref 202–409)
UIBC: 359 ug/dL (ref 117–376)

## 2019-08-08 LAB — FERRITIN: Ferritin: 17 ng/mL — ABNORMAL LOW (ref 24–336)

## 2019-08-08 MED ORDER — CLARITHROMYCIN 500 MG PO TABS: 500.0000 mg | ORAL_TABLET | Freq: Two times a day (BID) | ORAL | 0 refills | Status: AC

## 2019-08-08 MED ORDER — METRONIDAZOLE 500 MG PO TABS: 500.0000 mg | ORAL_TABLET | Freq: Two times a day (BID) | ORAL | 0 refills | Status: AC

## 2019-08-08 MED ORDER — PANTOPRAZOLE SODIUM 40 MG PO TBEC: 40.0000 mg | DELAYED_RELEASE_TABLET | Freq: Two times a day (BID) | ORAL | 0 refills | Status: DC

## 2019-08-08 MED ORDER — AMOXICILLIN 500 MG PO TABS: 1000.0000 mg | ORAL_TABLET | Freq: Two times a day (BID) | ORAL | 0 refills | Status: AC

## 2019-08-09 ENCOUNTER — Encounter: Payer: Self-pay | Admitting: Family

## 2019-08-11 ENCOUNTER — Other Ambulatory Visit: Payer: Self-pay | Admitting: Hematology & Oncology

## 2019-08-11 MED ORDER — HYDROXYUREA 500 MG PO CAPS: 500.0000 mg | ORAL_CAPSULE | Freq: Two times a day (BID) | ORAL | 4 refills | Status: DC

## 2019-08-12 ENCOUNTER — Telehealth: Payer: Self-pay | Admitting: Gastroenterology

## 2019-08-12 NOTE — Telephone Encounter (Signed)
[facility] needs to verify instructions for pantoprazole.

## 2019-08-13 NOTE — Telephone Encounter (Signed)
I have called and clarified this order.

## 2019-08-13 NOTE — Progress Notes (Addendum)
Agree with info from [name] on 07/31/19.  Thomas Hancock 7:39 PM 08/13/19

## 2019-08-14 ENCOUNTER — Ambulatory Visit: Payer: 59

## 2019-08-14 LAB — HEMOCHROMATOSIS DNA-PCR(C282Y,H63D)

## 2019-08-15 ENCOUNTER — Ambulatory Visit: Payer: 59

## 2019-08-18 ENCOUNTER — Telehealth: Payer: Self-pay | Admitting: Family

## 2019-08-18 NOTE — Telephone Encounter (Signed)
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 He has started the [facility] and is tolerating it well so far. His iron studies are stable so no intervention needed at this time.  His MyChart results are available for view for everything accept the JAK 2 result. It has not yet been scanned in to the system but should be shortly. No questions at this time.

## 2019-08-19 ENCOUNTER — Other Ambulatory Visit: Payer: Self-pay

## 2019-08-19 ENCOUNTER — Telehealth: Payer: Self-pay | Admitting: Gastroenterology

## 2019-08-19 DIAGNOSIS — R1310 Dysphagia, unspecified: Secondary | ICD-10-CM

## 2019-08-19 DIAGNOSIS — R1319 Other dysphagia: Secondary | ICD-10-CM

## 2019-08-19 DIAGNOSIS — A048 Other specified bacterial intestinal infections: Secondary | ICD-10-CM

## 2019-08-19 MED ORDER — PANTOPRAZOLE SODIUM 40 MG PO TBEC: 40.0000 mg | DELAYED_RELEASE_TABLET | Freq: Every day | ORAL | 0 refills | Status: DC

## 2019-08-19 NOTE — Telephone Encounter (Signed)
I have sent prescription to patient's pharmacy.  

## 2019-08-20 ENCOUNTER — Other Ambulatory Visit: Payer: Self-pay

## 2019-08-20 ENCOUNTER — Ambulatory Visit (INDEPENDENT_AMBULATORY_CARE_PROVIDER_SITE_OTHER): Payer: 59

## 2019-08-20 ENCOUNTER — Ambulatory Visit (HOSPITAL_BASED_OUTPATIENT_CLINIC_OR_DEPARTMENT_OTHER)
Admission: RE | Admit: 2019-08-20 | Discharge: 2019-08-20 | Disposition: A | Discharge status: Home / Self Care | Payer: 59 | Source: Ambulatory Visit | Attending: Family | Admitting: Family

## 2019-08-20 DIAGNOSIS — K7460 Unspecified cirrhosis of liver: Secondary | ICD-10-CM

## 2019-08-20 DIAGNOSIS — E538 Deficiency of other specified B group vitamins: Secondary | ICD-10-CM | POA: Diagnosis not present

## 2019-08-20 DIAGNOSIS — D45 Polycythemia vera: Secondary | ICD-10-CM | POA: Insufficient documentation

## 2019-08-20 DIAGNOSIS — R161 Splenomegaly, not elsewhere classified: Secondary | ICD-10-CM | POA: Insufficient documentation

## 2019-08-20 IMAGING — US US ABDOMEN COMPLETE
1 series · 13 of 25 positions shown · non-contrast
Comparison: None.

CLINICAL DATA: Polycythemia MARTIN. Assess for cirrhosis or
splenomegaly.

EXAM:
ABDOMEN ULTRASOUND COMPLETE

[Series 1: us abdomen complete · 63 acquisitions, 13 frames shown]
[im 1/63]
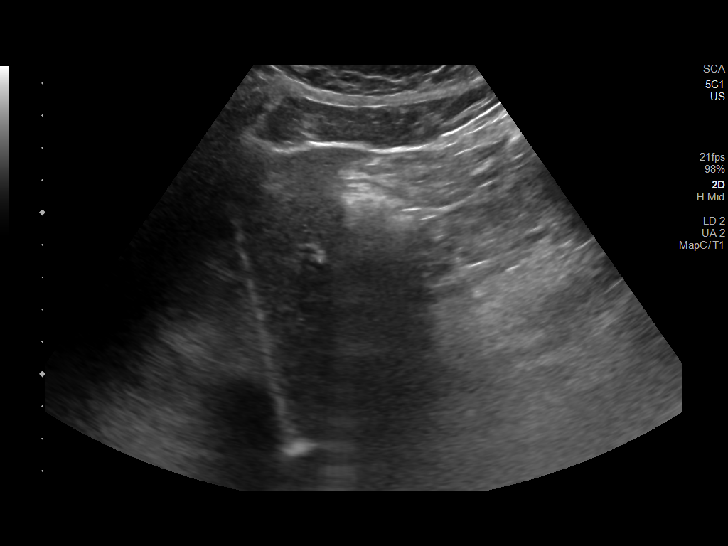
[im 6/63]
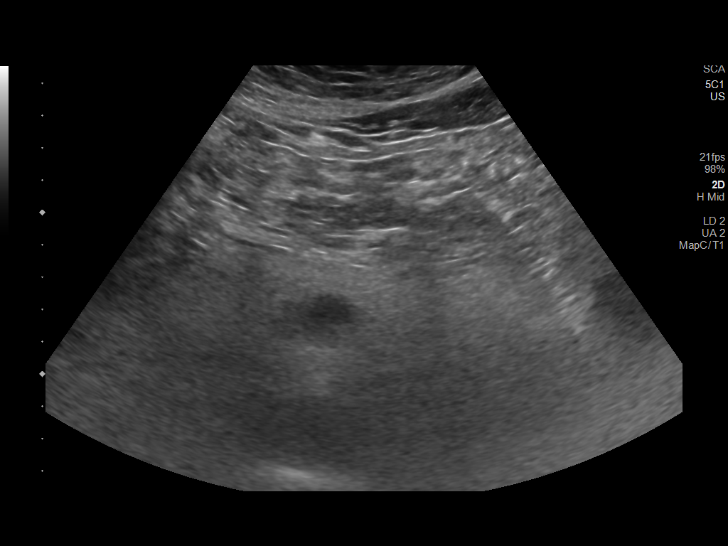
[im 11/63]
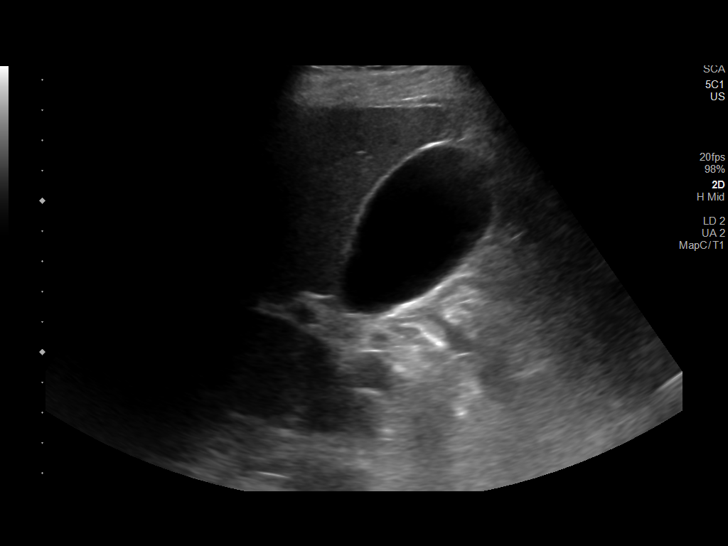
[im 16/63]
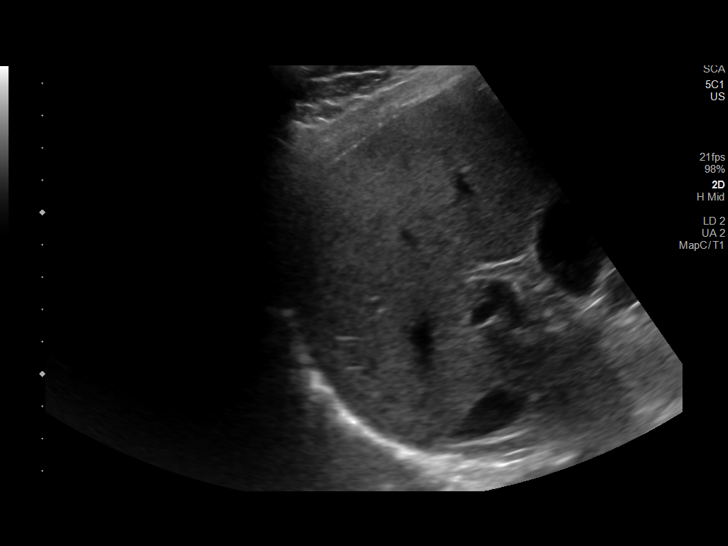
[im 21/63]
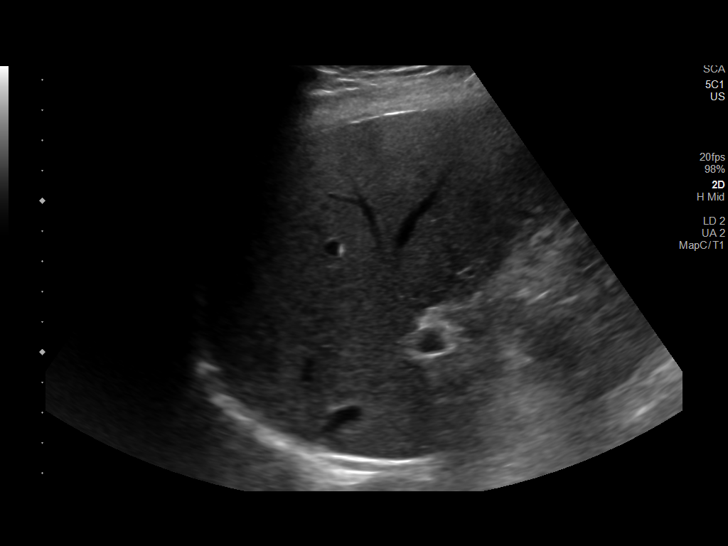
[im 26/63]
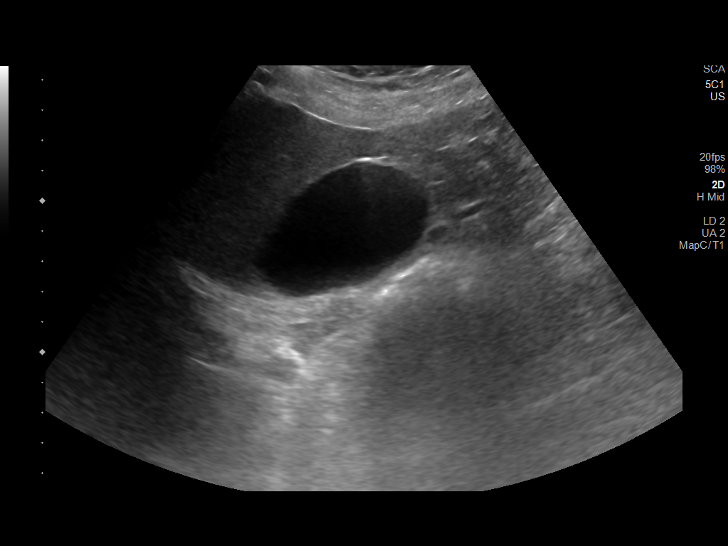
[im 32/63]
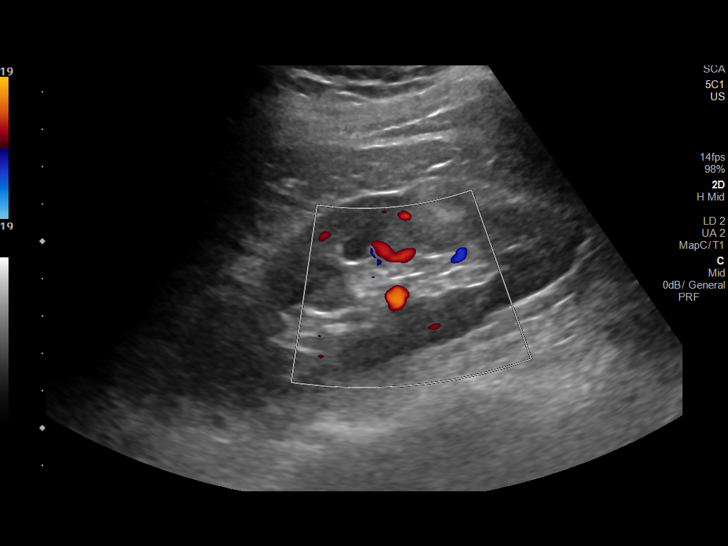
[im 37/63]
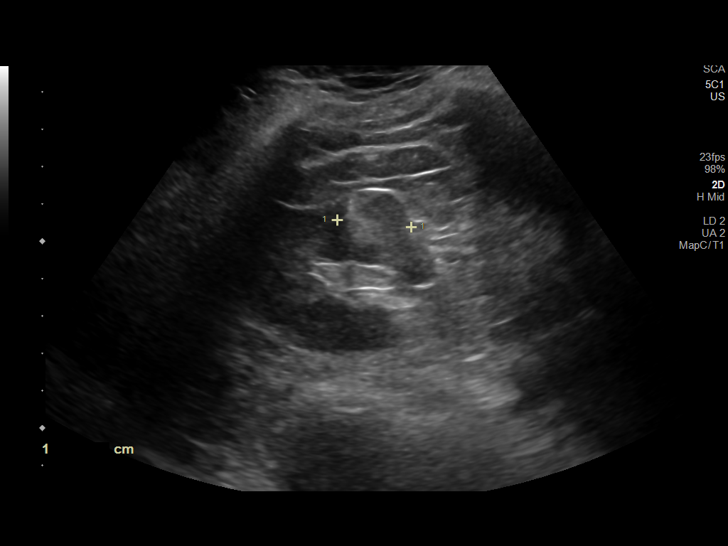
[im 42/63]
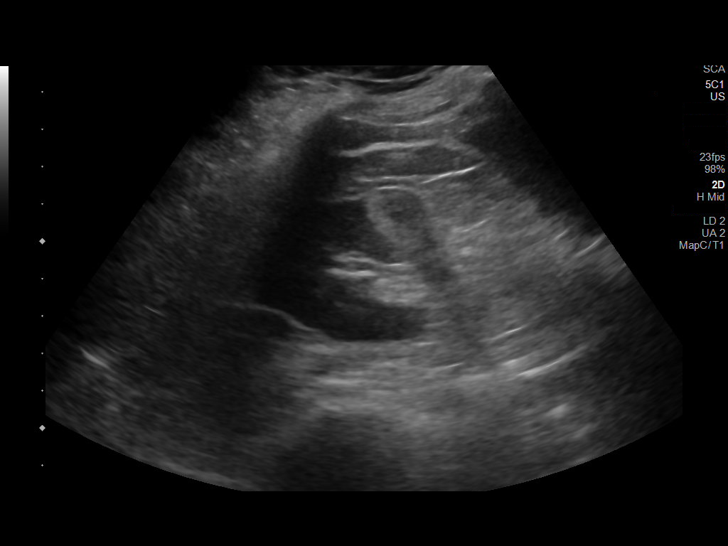
[im 47/63]
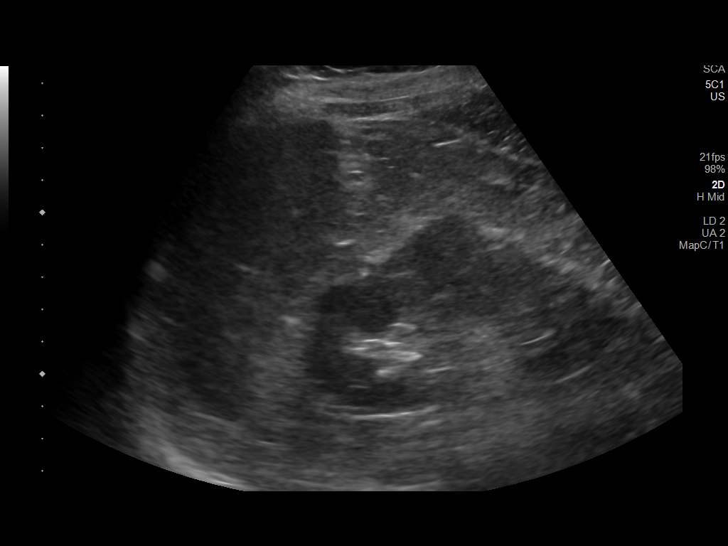
[im 52/63]
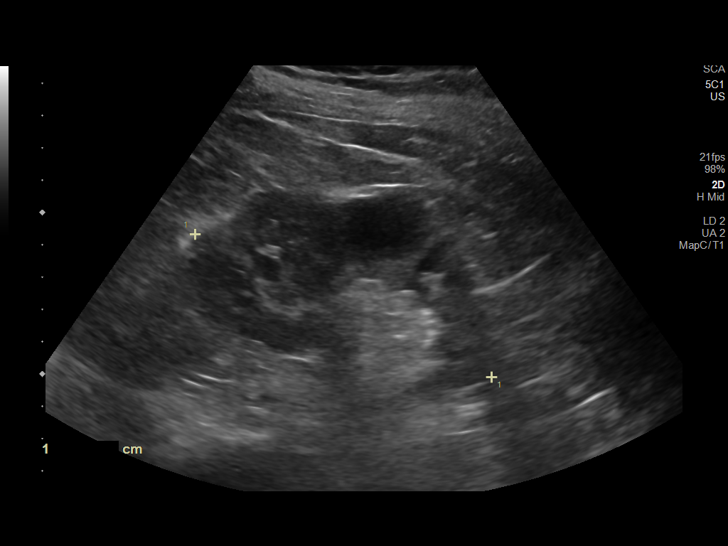
[im 57/63]
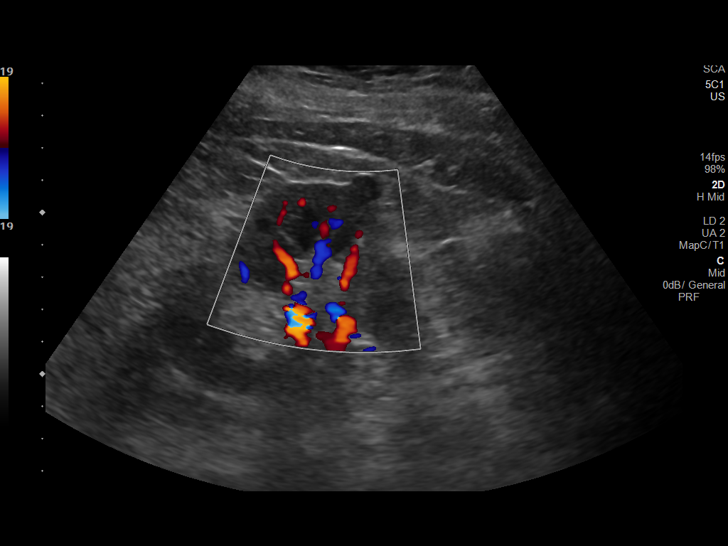
[im 63/63]
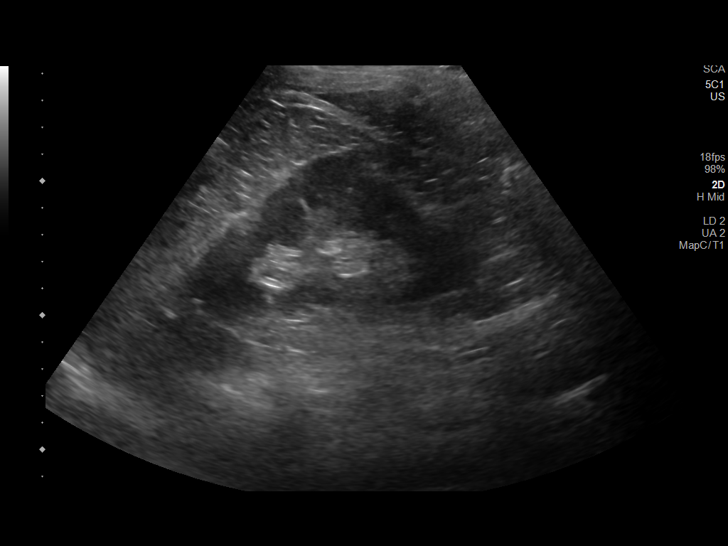

[13 of 25 positions shown; findings below may reference images not displayed]

FINDINGS: Gallbladder: Physiologically distended no gallstones or wall
thickening visualized. No sonographic Murphy sign noted by
sonographer.

Common bile duct: Diameter: 4 mm, normal.

Liver: No focal lesion identified. Within normal limits in
parenchymal echogenicity. No capsular nodularity. Portal vein is
patent on color Doppler imaging with normal direction of blood flow
towards the liver.

IVC: No abnormality visualized.

Pancreas: Visualized portion unremarkable.

Spleen: Size and appearance within normal limits.  No splenomegaly.

Right Kidney: Length: 10.9 cm. Echogenic solid lesion in the lower
kidney measures 2.2 x 1.9 x 2.0 cm. Echogenicity is otherwise within
normal limits. No hydronephrosis.

Left Kidney: Length: 10.2 cm. Echogenicity within normal limits.
Cyst in the mid kidney measuring 3.1 cm with internal septation.
Question of adjacent solid lesion versus prominent column of Bertin.
There is also an adjacent subcentimeter cyst. No hydronephrosis.

Abdominal aorta: No aneurysm visualized.

Other findings: No ascites.
IMPRESSION: 1. No sonographic evidence of cirrhosis. Normal appearance of the
liver.
2. Normal size spleen.  No splenomegaly.
3. **An incidental finding of potential clinical significance has
been found. Solid mass in the RIGHT kidney measuring 2.2 cm. Cyst
with internal septation in the LEFT kidney with question of adjacent
solid lesion. Recommend further evaluation with renal mass protocol
MRI.**

## 2019-08-20 MED ORDER — CYANOCOBALAMIN 1000 MCG/ML IJ SOLN
1000.0000 ug | Freq: Once | INTRAMUSCULAR | Status: AC
  Administered 2019-08-20: 11:00:00 1000 ug via INTRAMUSCULAR

## 2019-08-20 NOTE — Telephone Encounter (Signed)
Please do Protonix 40 mg p.o. once a day, 30, 11 refills  RG

## 2019-08-20 NOTE — Progress Notes (Signed)
Patient in today for monthly Vitamin B 12 injection.

## 2019-08-21 ENCOUNTER — Other Ambulatory Visit: Payer: Self-pay

## 2019-08-21 ENCOUNTER — Other Ambulatory Visit (INDEPENDENT_AMBULATORY_CARE_PROVIDER_SITE_OTHER): Payer: 59

## 2019-08-21 ENCOUNTER — Other Ambulatory Visit: Payer: Self-pay | Admitting: Family

## 2019-08-21 ENCOUNTER — Telehealth: Payer: Self-pay | Admitting: Family

## 2019-08-21 ENCOUNTER — Telehealth: Payer: Self-pay | Admitting: *Deleted

## 2019-08-21 DIAGNOSIS — N2889 Other specified disorders of kidney and ureter: Secondary | ICD-10-CM

## 2019-08-21 DIAGNOSIS — E538 Deficiency of other specified B group vitamins: Secondary | ICD-10-CM

## 2019-08-21 LAB — VITAMIN B12: Vitamin B-12: >1500 pg/mL — ABNORMAL HIGH (ref 211–911)

## 2019-08-21 LAB — JAK 2 V617F (GENPATH)

## 2019-08-21 MED ORDER — PANTOPRAZOLE SODIUM 40 MG PO TBEC: 40.0000 mg | DELAYED_RELEASE_TABLET | Freq: Every day | ORAL | 11 refills | Status: DC

## 2019-08-21 NOTE — Addendum Note (Signed)
Addended by: Caffie Pinto on: 08/21/2019 10:05 AM   Modules accepted: Orders

## 2019-08-21 NOTE — Telephone Encounter (Signed)
I was able to speak with [name] and go over his [place] results from yesterday including the solid mass noted on the right kidney and cyst noted on the left kidney. He is agreeable to having the MRI for further evaluation so I have placed the order. He will wait to hear from scheduling and we will contact him with results once thy are available.  He denies abdominal pain and states that he has the same back pain off and on that he has had for the last 14-15 years. No new issues with back discomfort.  No questions at this time.

## 2019-08-21 NOTE — Telephone Encounter (Signed)
Received call from [facility] on patient regarding results on US Abdomen Complete. Imaging report printed and results given to Laverna Peace, NP.

## 2019-08-22 ENCOUNTER — Telehealth: Payer: Self-pay | Admitting: Family Medicine

## 2019-08-22 ENCOUNTER — Other Ambulatory Visit: Payer: Self-pay | Admitting: *Deleted

## 2019-08-22 DIAGNOSIS — E538 Deficiency of other specified B group vitamins: Secondary | ICD-10-CM

## 2019-08-22 NOTE — Telephone Encounter (Signed)
Patient in regards to lab results. Patient would like a call back.

## 2019-08-22 NOTE — Telephone Encounter (Signed)
Patient received a call back

## 2019-08-26 ENCOUNTER — Other Ambulatory Visit: Payer: Self-pay

## 2019-08-27 ENCOUNTER — Other Ambulatory Visit: Payer: Self-pay

## 2019-08-27 ENCOUNTER — Encounter: Payer: Self-pay | Admitting: Family Medicine

## 2019-08-27 ENCOUNTER — Ambulatory Visit: Payer: 59 | Admitting: Family Medicine

## 2019-08-27 VITALS — BP 108/70 | HR 72 | Temp 97.0°F | Ht 72.0 in | Wt 180.1 lb

## 2019-08-27 DIAGNOSIS — F320 Major depressive disorder, single episode, mild: Secondary | ICD-10-CM

## 2019-08-27 DIAGNOSIS — G4700 Insomnia, unspecified: Secondary | ICD-10-CM | POA: Diagnosis not present

## 2019-08-27 MED ORDER — TRAZODONE HCL 50 MG PO TABS: 25.0000 mg | ORAL_TABLET | Freq: Every evening | ORAL | 3 refills | Status: DC | PRN

## 2019-08-27 NOTE — Progress Notes (Signed)
Chief Complaint  Patient presents with  . Follow-up    Subjective Thomas Hancock presents for f/u anxiety/depression. Here w his wife who helps provide hx.   He is currently being treated with Zoloft 50 mg/d.  Reports some improvement over pats 4 d since treatment. Is having trouble w nighttime awakenings due to racing thoughts. He does not snore or wake up gasping for air. Denies napping, EtOH/caff use. Does watch TV prior to bedtime.  No thoughts of harming self or others. No self-medication with alcohol, prescription drugs or illicit drugs. Pt is not following with a counselor/psychologist.  ROS Psych: No homicidal or suicidal thoughts  Past Medical History:  Diagnosis Date  . Vitamin B12 deficiency    Allergies as of 08/27/2019   No Known Allergies     Medication List       Accurate as of August 27, 2019 12:28 PM. If you have any questions, ask your nurse or doctor.        acetaminophen 325 MG tablet Commonly known as: TYLENOL Take 650 mg by mouth every 6 (six) hours as needed.   hydroxyurea 500 MG capsule Commonly known as: HYDREA Take 1 capsule (500 mg total) by mouth 2 (two) times daily. May take with food to minimize GI side effects.   pantoprazole 40 MG tablet Commonly known as: Protonix Take 1 tablet (40 mg total) by mouth 2 (two) times daily for 21 days. For 14 days then take one time daily for 14 days, then submit stool sample in 1 month   pantoprazole 40 MG tablet Commonly known as: PROTONIX Take 1 tablet (40 mg total) by mouth daily.   sertraline 50 MG tablet Commonly known as: ZOLOFT Take 1 tablet (50 mg total) by mouth daily. Take 1/2 tab daily for first 2 weeks.   traZODone 50 MG tablet Commonly known as: DESYREL Take 0.5-1 tablets (25-50 mg total) by mouth at bedtime as needed for sleep. Started by: Shelda Pal, DO       Exam BP 108/70 (BP Location: Left Arm, Patient Position: Sitting, Cuff Size: Normal)   Pulse 72   Temp (!)  97 F (36.1 C) (Temporal)   Ht 6' (1.829 m)   Wt 180 lb 2 oz (81.7 kg)   SpO2 93%   BMI 24.43 kg/m  General:  well developed, well nourished, in no apparent distress Lungs:  cno respiratory distress Psych: well oriented with normal range of affect and age-appropriate judgement/insight, alert and oriented x4.  Assessment and Plan  Depression, major, single episode, mild (HCC)  Insomnia, unspecified type  Cont Zoloft at 50 mg/d. Add trazodone prn. Counseling info provided. Sleep hygiene info discussed and written down in avs.  F/u in 4 weeks. The patient and his wife voiced understanding and agreement to the plan.  [place], DO 08/27/19 12:28 PM

## 2019-08-27 NOTE — Patient Instructions (Addendum)
I think we should stay on the Zoloft.   Please consider counseling. Contact [phone] to schedule an appointment or inquire about cost/insurance coverage.  Sleep Hygiene Tips:  Do not watch TV or look at screens within 1 hour of going to bed. If you do, make sure there is a blue light filter (nighttime mode) involved.  Try to go to bed around the same time every night. Wake up at the same time within 1 hour of regular time. Ex: If you wake up at 7 AM for work, do not sleep past 8 AM on days that you don't work.  Do not drink alcohol before bedtime.  Do not consume caffeine-containing beverages after noon or within 9 hours of intended bedtime.  Get regular exercise/physical activity in your life, but not within 2 hours of planned bedtime.  Do not take naps.   Do not eat within 2 hours of planned bedtime.  Melatonin, 3-5 mg 30-60 minutes before planned bedtime may be helpful.   The bed should be for sleep or sex only. If after 20-30 minutes you are unable to fall asleep, get up and do something relaxing. Do this until you feel ready to go to sleep again.   Let us know if you need anything.

## 2019-08-30 ENCOUNTER — Ambulatory Visit (HOSPITAL_BASED_OUTPATIENT_CLINIC_OR_DEPARTMENT_OTHER)
Admission: RE | Admit: 2019-08-30 | Discharge: 2019-08-30 | Disposition: A | Discharge status: Home / Self Care | Payer: 59 | Source: Ambulatory Visit | Attending: Family | Admitting: Family

## 2019-08-30 ENCOUNTER — Other Ambulatory Visit: Payer: Self-pay

## 2019-08-30 DIAGNOSIS — N2889 Other specified disorders of kidney and ureter: Secondary | ICD-10-CM | POA: Insufficient documentation

## 2019-08-30 IMAGING — MR MR ABDOMEN WO/W CM
9 of 17 series · 24 of 48 positions shown · IV contrast (GADAVIST)
Comparison: No prior abdominal MRI. Abdominal ultrasound
[DATE].

CLINICAL DATA: 63-year-old male with history of abnormal ultrasound
suspicious for renal mass. Follow-up study.

EXAM:
MRI ABDOMEN WITHOUT AND WITH CONTRAST
TECHNIQUE: Multiplanar multisequence MR imaging of the abdomen was performed
both before and after the administration of intravenous contrast.
CONTRAST:  7.5mL GADAVIST GADOBUTROL 1 MMOL/ML IV SOLN

[Series 3: T2 · coronal · 7.0mm · 1.33mm/px · 1 of 30 slices shown (1 of 2)]
[im 1/30]
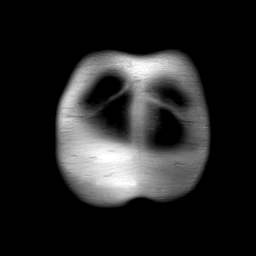

[Series 4: T2 fat-sat · axial · 6.0mm · 1.37mm/px · 1 of 34 slices shown]
[im 1/34]
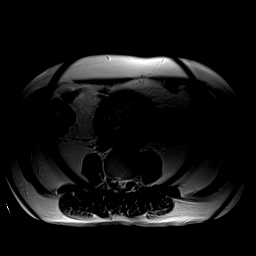

[Series 5: in + out · axial · 6.0mm · 0.68mm/px · z∈[-126,+121]mm · 4 of 68 slices shown]
[im 1/68]
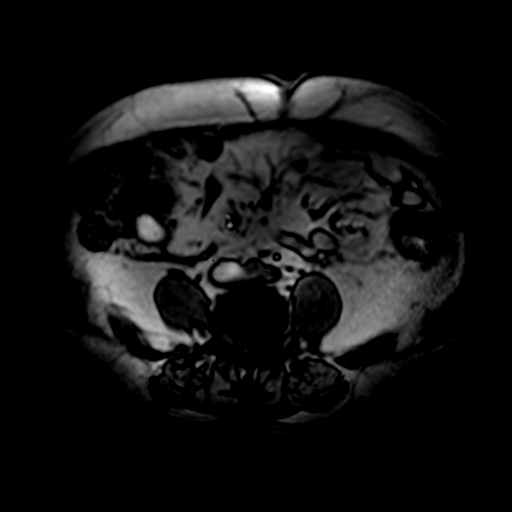
[im 23/68]
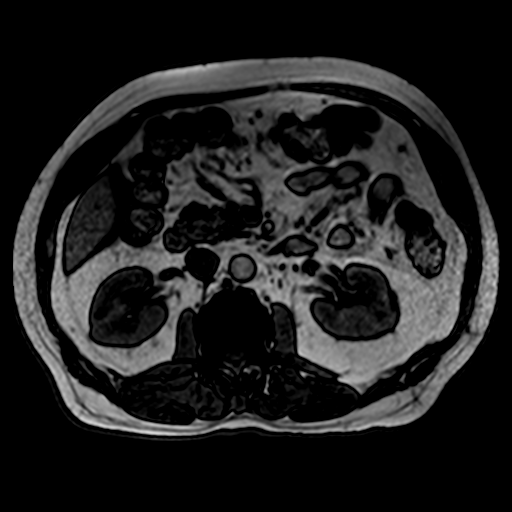
[im 45/68]
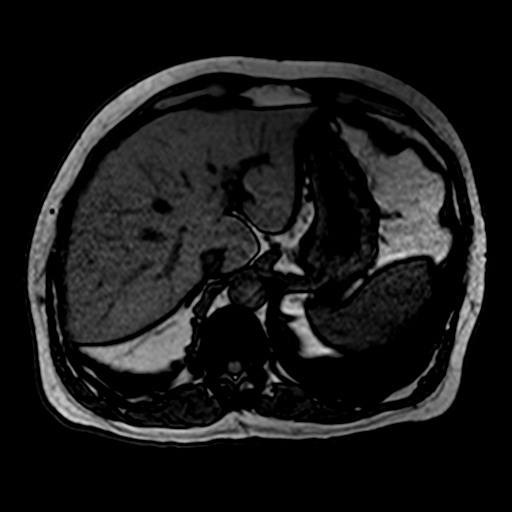
[im 68/68]
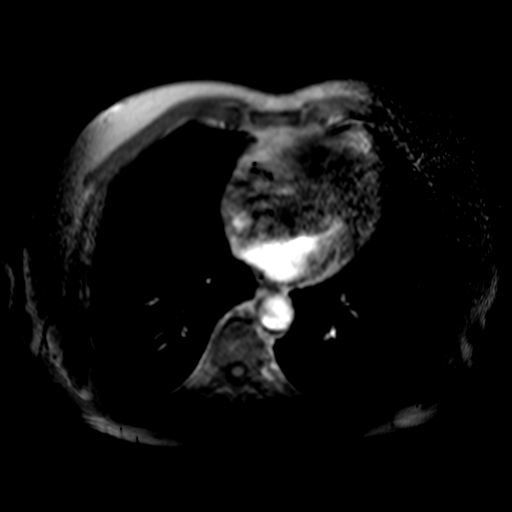

[Series 6: [id number] free breathing · axial · 6.0mm · 1.82mm/px · z∈[-92,+155]mm · 6 of 102 slices shown]
[im 1/102]
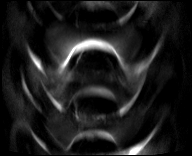
[im 21/102]
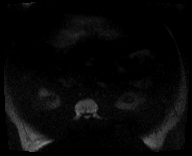
[im 41/102]
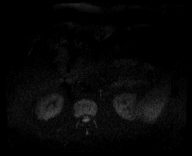
[im 61/102]
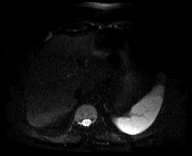
[im 81/102]
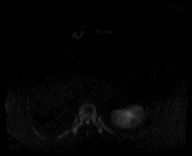
[im 102/102]
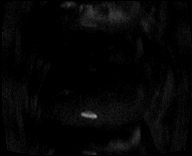

[Series 7: [id number] free breathing_adc · axial · 6.0mm · 1.82mm/px · z∈[-92,+155]mm · 2 of 34 slices shown]
[im 1/34]
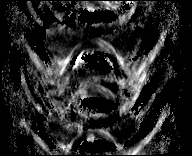
[im 34/34]
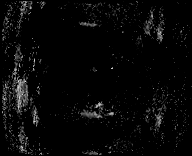

[Series 8: DWI · axial · 6.0mm · 0.68mm/px · z∈[-126,+121]mm · 2 of 34 slices shown]
[im 1/34]
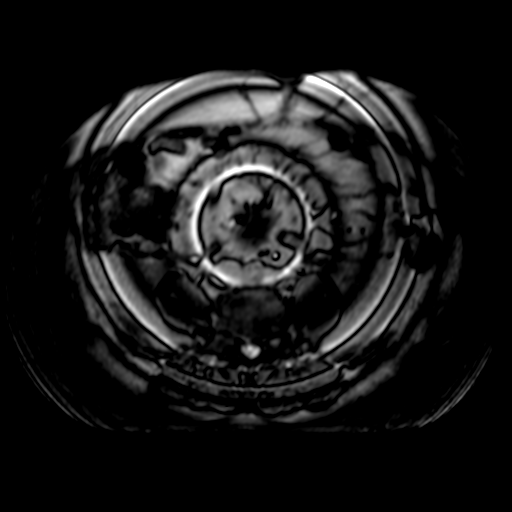
[im 34/34]
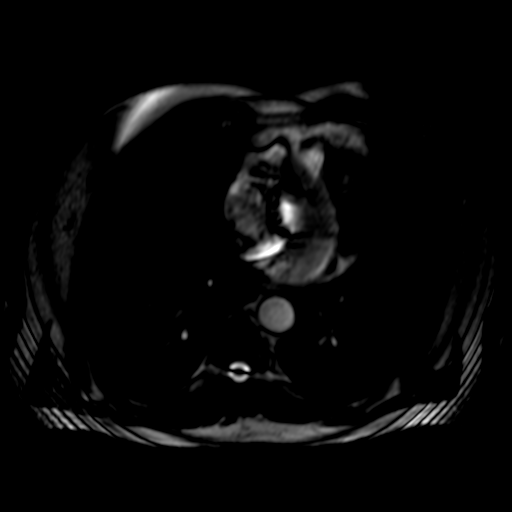

[Series 18: T2 · axial · 6.0mm · 1.33mm/px · z∈[-126,+121]mm · 2 of 34 slices shown (2 of 2)]
[im 1/34]
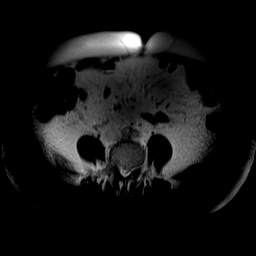
[im 34/34]
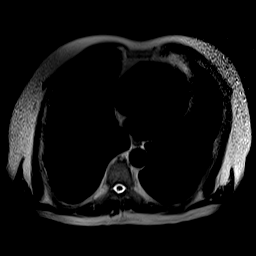

[Series 19: T1 dynamic fat-sat post-contrast · axial · delayed · 5.0mm · 1.18mm/px · z∈[-145,+110]mm · 3 of 52 slices shown (1 of 2)]
[im 1/52]
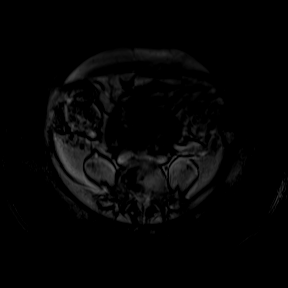
[im 26/52]
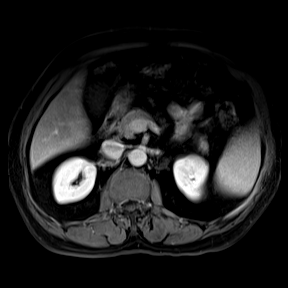
[im 52/52]
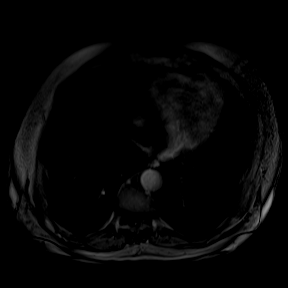

[Series 20: T1 dynamic fat-sat post-contrast · axial · 5.0mm · 1.18mm/px · z∈[-145,+110]mm · 3 of 52 slices shown (2 of 2)]
[im 1/52]
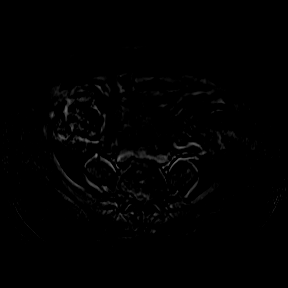
[im 26/52]
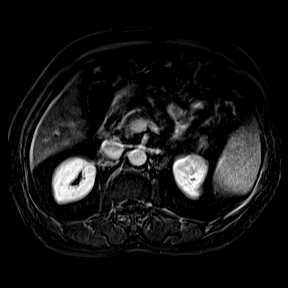
[im 52/52]
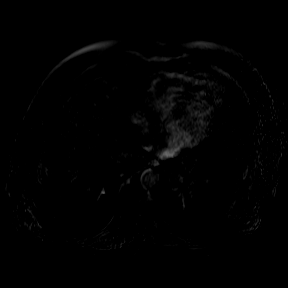

[24 of 48 positions shown; findings below may reference images not displayed]

FINDINGS: Lower chest: No acute findings.

Hepatobiliary: No suspicious cystic or solid hepatic lesions. No
intra or extrahepatic biliary ductal dilatation. Gallbladder is
normal in appearance.

Pancreas: No pancreatic mass. No pancreatic ductal dilatation. No
pancreatic or peripancreatic fluid collections or inflammatory
changes.

Spleen:  Unremarkable.

Adrenals/Urinary Tract: In the anterior aspect of the lower pole of
the right kidney there is a 1.9 x 1.5 x 1.8 cm lesion (axial image
35 of series 13 and coronal image 28 of series 17) which is T1
isointense, T2 isointense and demonstrates post gadolinium
enhancement with washout, compatible with a small renal neoplasm.
This lesion is encapsulated within Gerota's fascia and well
separated from the right renal vein which is widely patent. Multiple
other T1 hypointense, T2 hyperintense, nonenhancing lesions in both
kidneys, compatible with simple cysts, largest of which measures 3
cm in diameter. No hydroureteronephrosis visualized portions of the
abdomen. Bilateral adrenal glands are normal in appearance.

Stomach/Bowel: There visualized portions are unremarkable.

Vascular/Lymphatic: No aneurysm identified in the visualized
abdominal vasculature. No lymphadenopathy noted in the abdomen.

Other: No significant volume of ascites noted in the visualized
portions of the peritoneal cavity.

Musculoskeletal: No aggressive appearing osseous lesions are noted
in the visualized portions of the skeleton.
IMPRESSION: 1. 1.9 x 1.5 x 1.8 cm enhancing neoplasm in the lower pole of the
right kidney concerning for renal cell carcinoma. This is
encapsulated within Gerota's fascia and is well separated right
renal vein which is widely patent. No signs of metastatic disease in
the abdomen.
2. Other small simple cysts in the kidneys bilaterally.

## 2019-08-30 MED ORDER — GADOBUTROL 1 MMOL/ML IV SOLN
7.5000 mL | Freq: Once | INTRAVENOUS | Status: AC | PRN
  Administered 2019-08-30: 7.5 mL via INTRAVENOUS

## 2019-09-01 ENCOUNTER — Telehealth: Payer: Self-pay | Admitting: Family

## 2019-09-01 ENCOUNTER — Encounter: Payer: Self-pay | Admitting: Family

## 2019-09-01 ENCOUNTER — Other Ambulatory Visit: Payer: Self-pay | Admitting: Family

## 2019-09-01 DIAGNOSIS — N2889 Other specified disorders of kidney and ureter: Secondary | ICD-10-CM

## 2019-09-01 DIAGNOSIS — C641 Malignant neoplasm of right kidney, except renal pelvis: Secondary | ICD-10-CM

## 2019-09-01 NOTE — Telephone Encounter (Signed)
I was able to speak with Thomas Hancock and go over his MRI results. He has a small mass on the right kidney suspicious for new renal cell carcinoma. He verbalized understanding and knows that we have placed an urgent referral to urologist Dr. Chauncey Cruel. [name] for further workup and treatment with surgery. I also sent him every thing we talked about in a patient message along with contact info for both our office and urology's office. No further questions at this time.

## 2019-09-04 ENCOUNTER — Telehealth: Payer: Self-pay | Admitting: Family

## 2019-09-04 ENCOUNTER — Other Ambulatory Visit: Payer: Self-pay

## 2019-09-04 ENCOUNTER — Inpatient Hospital Stay: Payer: 59 | Attending: Family

## 2019-09-04 ENCOUNTER — Encounter: Payer: Self-pay | Admitting: Family

## 2019-09-04 ENCOUNTER — Inpatient Hospital Stay (HOSPITAL_BASED_OUTPATIENT_CLINIC_OR_DEPARTMENT_OTHER): Payer: 59 | Admitting: Family

## 2019-09-04 VITALS — BP 118/79 | HR 71 | Temp 97.8°F | Resp 18 | Ht 72.0 in | Wt 180.1 lb

## 2019-09-04 DIAGNOSIS — M545 Low back pain: Secondary | ICD-10-CM | POA: Insufficient documentation

## 2019-09-04 DIAGNOSIS — C641 Malignant neoplasm of right kidney, except renal pelvis: Secondary | ICD-10-CM | POA: Diagnosis not present

## 2019-09-04 DIAGNOSIS — Z79899 Other long term (current) drug therapy: Secondary | ICD-10-CM | POA: Diagnosis not present

## 2019-09-04 DIAGNOSIS — G8929 Other chronic pain: Secondary | ICD-10-CM | POA: Diagnosis not present

## 2019-09-04 DIAGNOSIS — D45 Polycythemia vera: Secondary | ICD-10-CM

## 2019-09-04 DIAGNOSIS — Z1589 Genetic susceptibility to other disease: Secondary | ICD-10-CM

## 2019-09-04 DIAGNOSIS — D509 Iron deficiency anemia, unspecified: Secondary | ICD-10-CM

## 2019-09-04 DIAGNOSIS — N2889 Other specified disorders of kidney and ureter: Secondary | ICD-10-CM | POA: Insufficient documentation

## 2019-09-04 DIAGNOSIS — R42 Dizziness and giddiness: Secondary | ICD-10-CM | POA: Insufficient documentation

## 2019-09-04 LAB — CBC WITH DIFFERENTIAL (CANCER CENTER ONLY)
Abs Immature Granulocytes: 0.03 10*3/uL (ref 0.00–0.07)
Basophils Absolute: 0.2 10*3/uL — ABNORMAL HIGH (ref 0.0–0.1)
Basophils Relative: 3 %
Eosinophils Absolute: 0.4 10*3/uL (ref 0.0–0.5)
Eosinophils Relative: 6 %
HCT: 53.5 % — ABNORMAL HIGH (ref 39.0–52.0)
Hemoglobin: 16.9 g/dL (ref 13.0–17.0)
Immature Granulocytes: 1 %
Lymphocytes Relative: 20 %
Lymphs Abs: 1.2 10*3/uL (ref 0.7–4.0)
MCH: 25.5 pg — ABNORMAL LOW (ref 26.0–34.0)
MCHC: 31.6 g/dL (ref 30.0–36.0)
MCV: 80.7 fL (ref 80.0–100.0)
Monocytes Absolute: 0.7 10*3/uL (ref 0.1–1.0)
Monocytes Relative: 11 %
Neutro Abs: 3.8 10*3/uL (ref 1.7–7.7)
Neutrophils Relative %: 59 %
Platelet Count: 436 10*3/uL — ABNORMAL HIGH (ref 150–400)
RBC: 6.63 MIL/uL — ABNORMAL HIGH (ref 4.22–5.81)
RDW: 18.4 % — ABNORMAL HIGH (ref 11.5–15.5)
WBC Count: 6.2 10*3/uL (ref 4.0–10.5)
nRBC: 0 % (ref 0.0–0.2)

## 2019-09-04 LAB — CMP (CANCER CENTER ONLY)
ALT: 17 U/L (ref 0–44)
AST: 20 U/L (ref 15–41)
Albumin: 4.6 g/dL (ref 3.5–5.0)
Alkaline Phosphatase: 77 U/L (ref 38–126)
Anion gap: 9 (ref 5–15)
BUN: 18 mg/dL (ref 8–23)
CO2: 28 mmol/L (ref 22–32)
Calcium: 9.3 mg/dL (ref 8.9–10.3)
Chloride: 102 mmol/L (ref 98–111)
Creatinine: 1.07 mg/dL (ref 0.61–1.24)
GFR, Est AFR Am: >60 mL/min (ref >60)
GFR, Estimated: >60 mL/min (ref >60)
Glucose, Bld: 151 mg/dL — ABNORMAL HIGH (ref 70–99)
Potassium: 4 mmol/L (ref 3.5–5.1)
Sodium: 139 mmol/L (ref 135–145)
Total Bilirubin: 0.9 mg/dL (ref 0.3–1.2)
Total Protein: 7.5 g/dL (ref 6.5–8.1)

## 2019-09-04 LAB — FERRITIN: Ferritin: 44 ng/mL (ref 24–336)

## 2019-09-04 LAB — IRON AND TIBC
Iron: 108 ug/dL (ref 42–163)
Saturation Ratios: 31 % (ref 20–55)
TIBC: 349 ug/dL (ref 202–409)
UIBC: 241 ug/dL (ref 117–376)

## 2019-09-04 LAB — LACTATE DEHYDROGENASE: LDH: 180 U/L (ref 98–192)

## 2019-09-04 NOTE — Telephone Encounter (Signed)
Appointments scheduled calendar printed.  I have called to Dr [name]'s off @ [facility] regarding referral that was placed 3/8.  Spoke with Mickel Baas and she will work on getting his appointment scheduled. Per 3/11 los

## 2019-09-04 NOTE — Progress Notes (Signed)
Hematology and Oncology Follow Up Visit  Thomas Hancock [id number] [birth year] 64 y.o. 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 Diona Fanti   Current Therapy:   Hydrea 500 mg PO BID Phlebotomy as indicated to maintain ferritin < 100 and iron saturation < 50%   Interim History:  Thomas Hancock is here today with his wife for follow-up. Daughter was on the phone as well. He has had a nice response to [facility] and verbalized that he is taking as prescribed. Platelet count is now down to 436, Hgb 16.9 and WBC count 6.2.  His iron studies are actually low.  He has had no issues with bleeding. No bruising or petechiae.  He denies fatigue.  During his work up for JAK-2 and hemochromatosis we have come across a right renal mass that is suspicious for renal cell carcinoma. Urgent referral was placed earlier this week with Dr. Diona Fanti and we will follow-up today for appointment date and time. He remains asymptomatic at this time.  He has chronic lower right back pain for the last 14 years or so. This comes and goes.  No issues with infection. No fever, chills, n/v, cough, rash, SOB, chest pain, palpitations, abdominal pain or changes in bowel or bladder habits.  He has occasional dizziness.  No falls or syncopal episodes to report. He has maintained a good appetite and is staying well hydrated. His weight is stable.   ECOG Performance Status: 1 - Symptomatic but completely ambulatory  Medications:  Allergies as of 09/04/2019   No Known Allergies     Medication List       Accurate as of September 04, 2019 10:45 AM. If you have any questions, ask your nurse or doctor.        acetaminophen 325 MG tablet Commonly known as: TYLENOL Take 650 mg by mouth every 6 (six) hours as needed.   hydroxyurea 500 MG capsule Commonly known as:  HYDREA Take 1 capsule (500 mg total) by mouth 2 (two) times daily. May take with food to minimize GI side effects.   pantoprazole 40 MG tablet Commonly known as: PROTONIX Take 1 tablet (40 mg total) by mouth daily. What changed: Another medication with the same name was removed. Continue taking this medication, and follow the directions you see here. Changed by: Laverna Peace, NP   sertraline 50 MG tablet Commonly known as: ZOLOFT Take 1 tablet (50 mg total) by mouth daily. Take 1/2 tab daily for first 2 weeks.   traZODone 50 MG tablet Commonly known as: DESYREL Take 0.5-1 tablets (25-50 mg total) by mouth at bedtime as needed for sleep.       Allergies: No Known Allergies  Past Medical History, Surgical history, Social history, and Family History were reviewed and updated.  Review of Systems: All other 10 point review of systems is negative.   Physical Exam:  height is 6' (1.829 m) and weight is 180 lb 1.9 oz (81.7 kg). His temporal temperature is 97.8 F (36.6 C). His blood pressure is 118/79 and his pulse is 71. His respiration is 18 and oxygen saturation is 100%.   Wt Readings from Last 3 Encounters:  09/04/19 180 lb 1.9 oz (81.7 kg)  08/27/19 180 lb 2 oz (81.7 kg)  08/07/19 179 lb (81.2 kg)    Ocular: Sclerae unicteric, pupils equal, round and reactive to light Ear-nose-throat: Oropharynx  clear, dentition fair Lymphatic: No cervical or supraclavicular adenopathy Lungs no rales or rhonchi, good excursion bilaterally Heart regular rate and rhythm, no murmur appreciated Abd soft, nontender, positive bowel sounds, no liver or spleen tip palpated on exam, no fluid wave  MSK no focal spinal tenderness, no joint edema Neuro: non-focal, well-oriented, appropriate affect Breasts: Deferred   Lab Results  Component Value Date   WBC 6.2 09/04/2019   HGB 16.9 09/04/2019   HCT 53.5 (H) 09/04/2019   MCV 80.7 09/04/2019   PLT 436 (H) 09/04/2019   Lab Results  Component  Value Date   FERRITIN 17 (L) 08/07/2019   IRON 45 08/07/2019   TIBC 404 08/07/2019   UIBC 359 08/07/2019   IRONPCTSAT 11 (L) 08/07/2019   Lab Results  Component Value Date   RETICCTPCT 1.0 08/07/2019   RBC 6.63 (H) 09/04/2019   No results found for: KPAFRELGTCHN, LAMBDASER, KAPLAMBRATIO No results found for: IGGSERUM, IGA, IGMSERUM No results found for: Odetta Pink, SPEI   Chemistry      Component Value Date/Time   NA 139 09/04/2019 0824   K 4.0 09/04/2019 0824   CL 102 09/04/2019 0824   CO2 28 09/04/2019 0824   BUN 18 09/04/2019 0824   CREATININE 1.07 09/04/2019 0824      Component Value Date/Time   CALCIUM 9.3 09/04/2019 0824   ALKPHOS 77 09/04/2019 0824   AST 20 09/04/2019 0824   ALT 17 09/04/2019 0824   BILITOT 0.9 09/04/2019 0824       Impression and Plan: Thomas Hancock is a very pleasant 64 yo [name] with Polycythemia vera, JAK 2 positive, hemochromatosis, homozygous for the H63D mutation and right renal mass suspicious for renal cell carcinoma.  He has responded nicely to [facility] and will remain on his same regimen.  Iron studies are pending. We will set him up for phlebotomy when and if needed. So far, this has not been an issue for him.  We have contacted Dr. Alan Ripper office and they are working to get him in ASAP.  We will plan to see him back in another 6 week.  All questions at this time were answered and they were encouraged to call with any new questions or concerns. We can certainly see him sooner if needed.   Laverna Peace, NP 3/11/202110:45 AM

## 2019-09-08 ENCOUNTER — Encounter: Payer: Self-pay | Admitting: Family

## 2019-09-15 ENCOUNTER — Encounter: Payer: Self-pay | Admitting: Family

## 2019-09-16 ENCOUNTER — Other Ambulatory Visit: Payer: Self-pay | Admitting: Urology

## 2019-09-18 ENCOUNTER — Other Ambulatory Visit: Payer: Self-pay

## 2019-09-18 ENCOUNTER — Ambulatory Visit (INDEPENDENT_AMBULATORY_CARE_PROVIDER_SITE_OTHER): Payer: 59

## 2019-09-18 DIAGNOSIS — E538 Deficiency of other specified B group vitamins: Secondary | ICD-10-CM | POA: Diagnosis not present

## 2019-09-18 MED ORDER — CYANOCOBALAMIN 1000 MCG/ML IJ SOLN
1000.0000 ug | INTRAMUSCULAR | Status: DC
  Administered 2019-09-18: 1000 ug via INTRAMUSCULAR

## 2019-09-18 NOTE — Progress Notes (Signed)
Pt here for monthly B12 injection per Dr.Wendling  B12 1021mcg given IM, left deltoid, and pt tolerated injection well

## 2019-09-30 ENCOUNTER — Other Ambulatory Visit (HOSPITAL_BASED_OUTPATIENT_CLINIC_OR_DEPARTMENT_OTHER): Payer: Self-pay | Admitting: Urology

## 2019-09-30 ENCOUNTER — Other Ambulatory Visit: Payer: Self-pay

## 2019-09-30 ENCOUNTER — Ambulatory Visit (HOSPITAL_BASED_OUTPATIENT_CLINIC_OR_DEPARTMENT_OTHER)
Admission: RE | Admit: 2019-09-30 | Discharge: 2019-09-30 | Disposition: A | Discharge status: Home / Self Care | Payer: 59 | Source: Ambulatory Visit | Attending: Urology | Admitting: Urology

## 2019-09-30 DIAGNOSIS — D49511 Neoplasm of unspecified behavior of right kidney: Secondary | ICD-10-CM

## 2019-09-30 IMAGING — DX DG CHEST 2V
2 series · 2 of 2 positions shown · non-contrast
Comparison: None.

CLINICAL DATA: History of right renal neoplasm

EXAM:
CHEST - 2 VIEW

[chest pa]
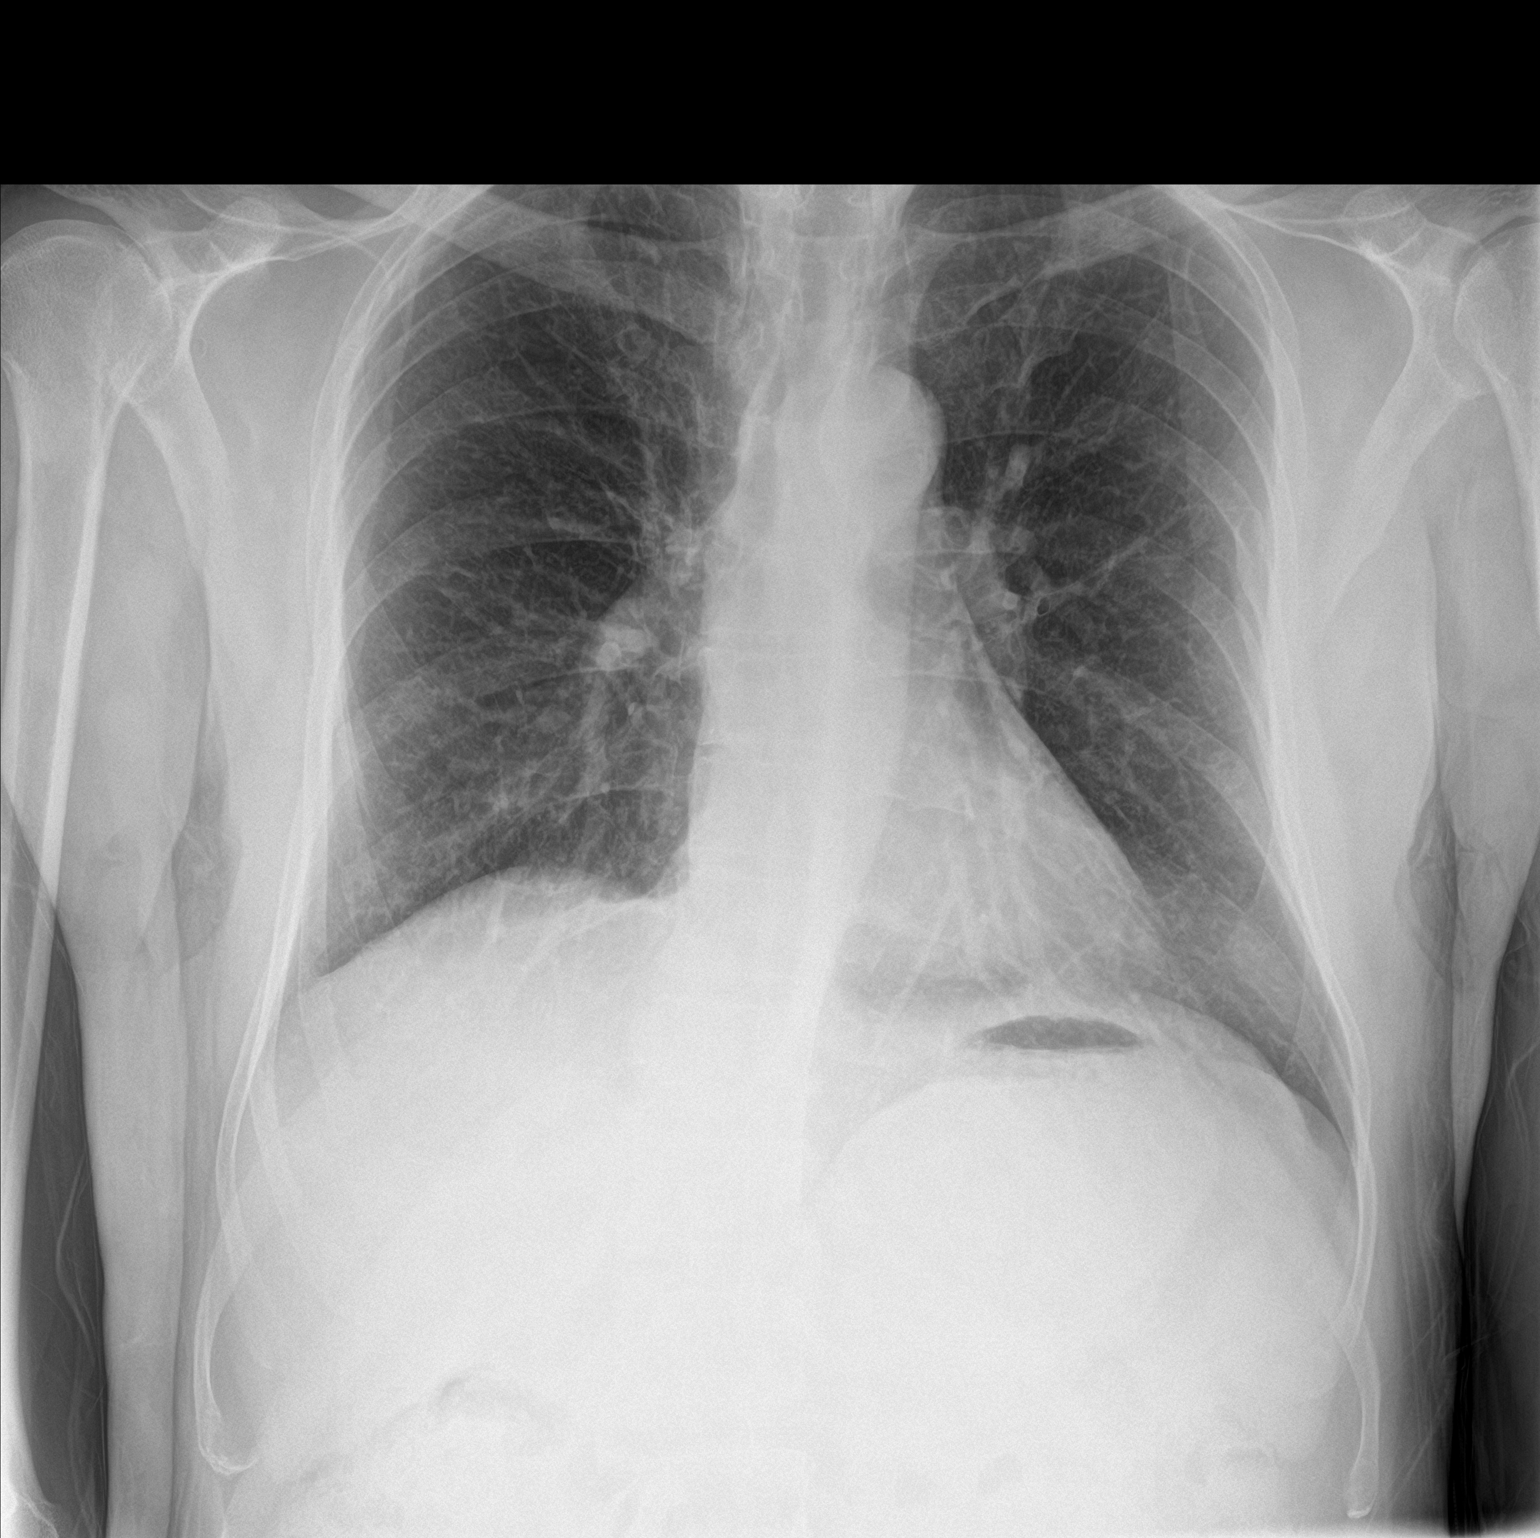

[chest lat]
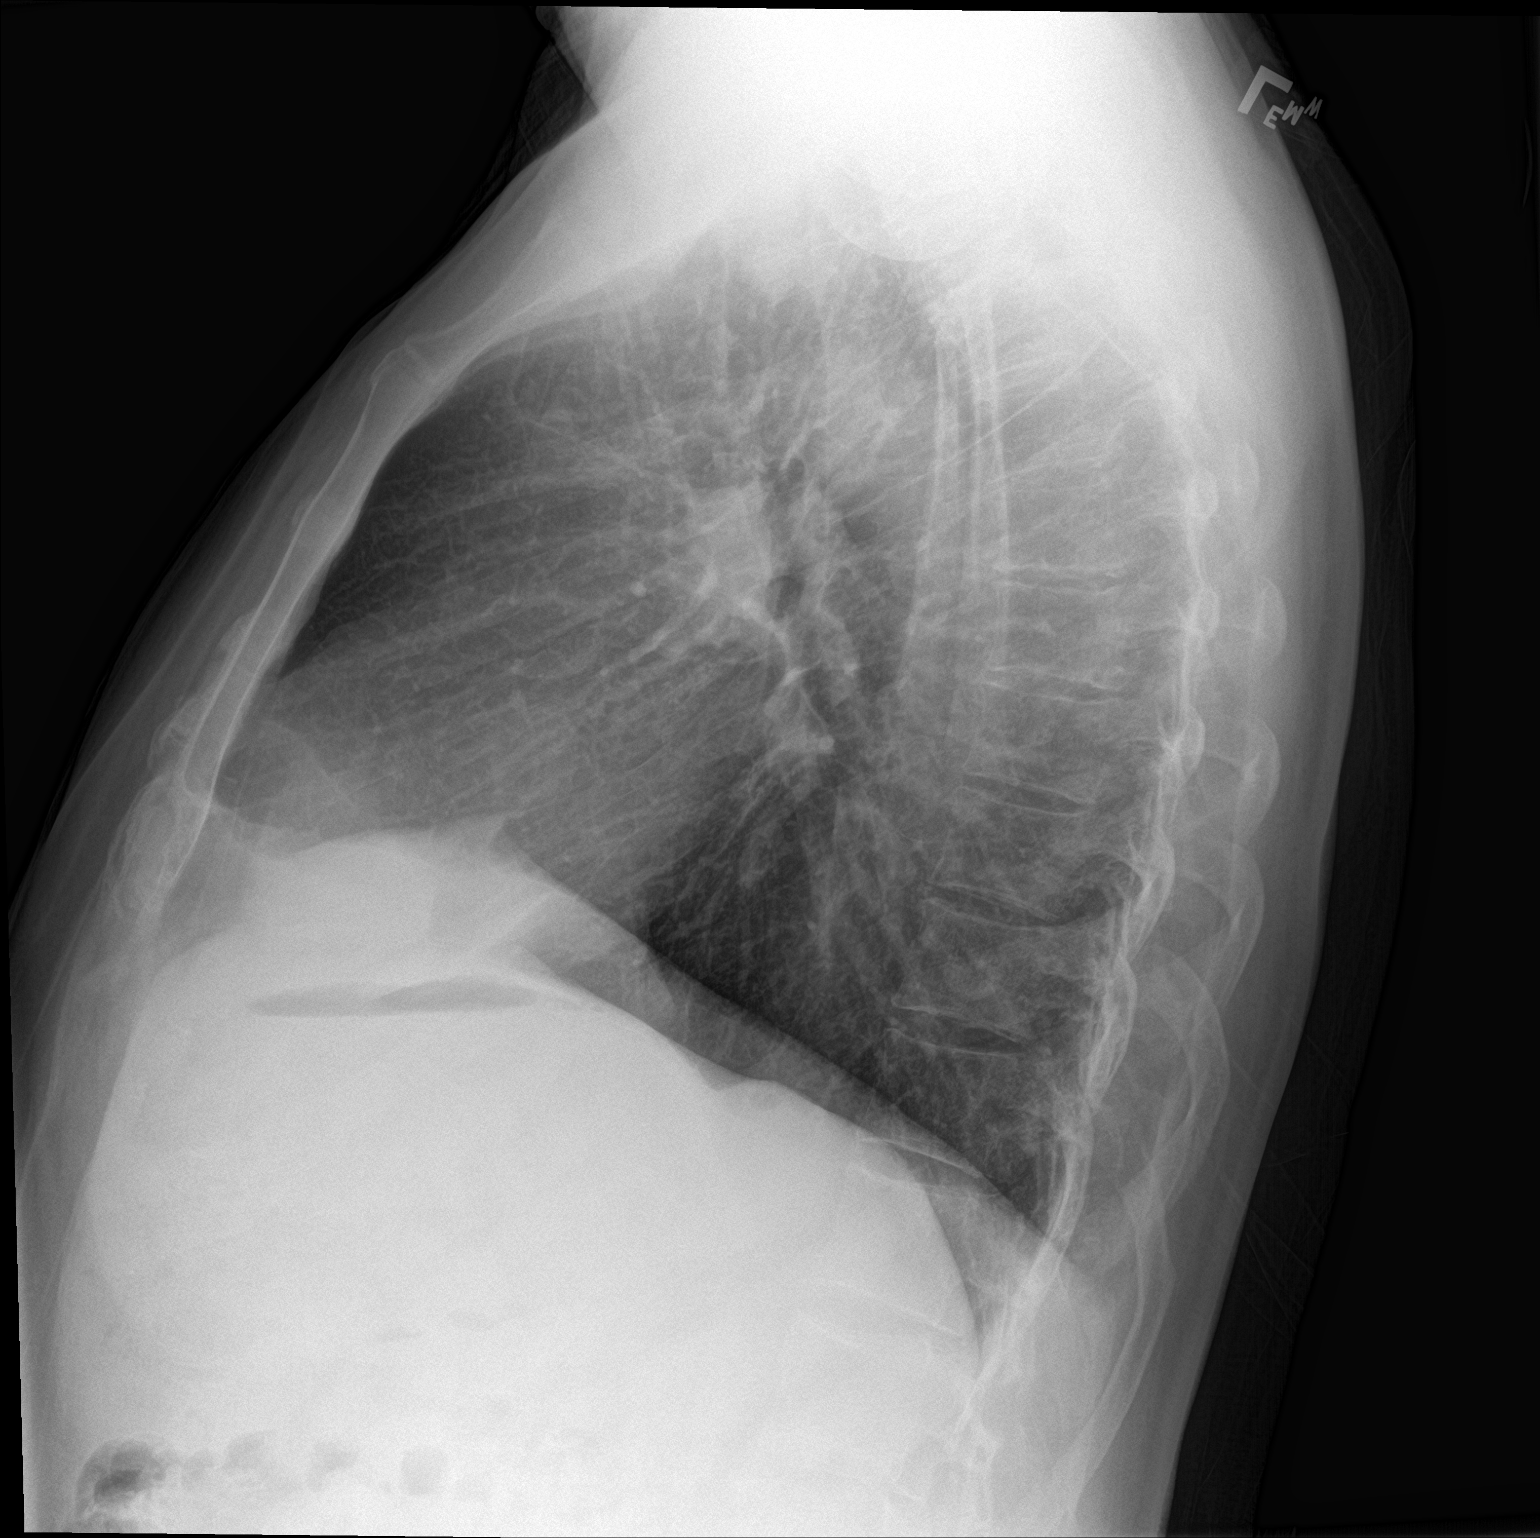

[2 of 2 positions shown; findings below may reference images not displayed]

FINDINGS: Lungs are clear. Heart size and pulmonary vascularity are normal. No
adenopathy. No blastic or lytic bone lesions.
IMPRESSION: No neoplastic focus evident.  Lungs clear.  No evident adenopathy.

## 2019-10-01 ENCOUNTER — Encounter (HOSPITAL_COMMUNITY): Payer: Self-pay

## 2019-10-01 NOTE — Patient Instructions (Addendum)
DUE TO COVID-19 ONLY TWO VISITORS ARE ALLOWED TO COME WITH YOU AND STAY IN THE WAITING ROOM ONLY DURING PRE OP AND PROCEDURE. THE TWO VISITORS MAY VISIT WITH YOU IN YOUR PRIVATE ROOM DURING VISITING HOURS ONLY!!   COVID SWAB TESTING MUST BE COMPLETED ON: Tuesday, October 07, 2019 at  8:35 AM   [address] [place] [facility] enter pre surgical testing line (Must self quarantine after testing. Follow instructions on handout.)             Your procedure is scheduled on: Friday, October 10, 2019   Report to [facility] Main  Entrance    Report to admitting at 10:30 AM   Call this number if you have problems the morning of surgery [phone]   Do not eat food or drink liquids :After Midnight.   Oral Hygiene is also important to reduce your risk of infection.                                    Remember - BRUSH YOUR TEETH THE MORNING OF SURGERY WITH YOUR REGULAR TOOTHPASTE   Do NOT smoke after Midnight   Take these medicines the morning of surgery with A SIP OF WATER: Sertraline                               You may not have any metal on your body including jewelry, and body piercings             Do not wear lotions, powders, perfumes/cologne, or deodorant                          Men may shave face and neck.   Do not bring valuables to the hospital. [place].   Contacts, dentures or bridgework may not be worn into surgery.   Bring small overnight bag day of surgery.    Special Instructions: Bring a copy of your healthcare power of attorney and living will documents         the day of surgery if you haven't scanned them in before.              Please read over the following fact sheets you were given:  [facility] - Preparing for Surgery Before surgery, you can play an important role.  Because skin is not sterile, your skin needs to be as free of germs as possible.  You can reduce the number of  germs on your skin by washing with CHG (chlorahexidine gluconate) soap before surgery.  CHG is an antiseptic cleaner which kills germs and bonds with the skin to continue killing germs even after washing. Please DO NOT use if you have an allergy to CHG or antibacterial soaps.  If your skin becomes reddened/irritated stop using the CHG and inform your nurse when you arrive at Short Stay. Do not shave (including legs and underarms) for at least 48 hours prior to the first CHG shower.  You may shave your face/neck.  Please follow these instructions carefully:  1.  Shower with CHG Soap the night before surgery and the  morning of surgery.  2.  If you choose to wash your hair,  wash your hair first as usual with your normal  shampoo.  3.  After you shampoo, rinse your hair and body thoroughly to remove the shampoo.                             4.  Use CHG as you would any other liquid soap.  You can apply chg directly to the skin and wash.  Gently with a scrungie or clean washcloth.  5.  Apply the CHG Soap to your body ONLY FROM THE NECK DOWN.   Do   not use on face/ open                           Wound or open sores. Avoid contact with eyes, ears mouth and   genitals (private parts).                       Wash face,  Genitals (private parts) with your normal soap.             6.  Wash thoroughly, paying special attention to the area where your    surgery  will be performed.  7.  Thoroughly rinse your body with warm water from the neck down.  8.  DO NOT shower/wash with your normal soap after using and rinsing off the CHG Soap.                9.  Pat yourself dry with a clean towel.            10.  Wear clean pajamas.            11.  Place clean sheets on your bed the night of your first shower and do not  sleep with pets. Day of Surgery : Do not apply any lotions/deodorants the morning of surgery.  Please wear clean clothes to the hospital/surgery center.  FAILURE TO FOLLOW THESE INSTRUCTIONS MAY  RESULT IN THE CANCELLATION OF YOUR SURGERY  PATIENT SIGNATURE_________________________________  NURSE SIGNATURE__________________________________  ________________________________________________________________________  WHAT IS A BLOOD TRANSFUSION? Blood Transfusion Information  A transfusion is the replacement of blood or some of its parts. Blood is made up of multiple cells which provide different functions.  Red blood cells carry oxygen and are used for blood loss replacement.  White blood cells fight against infection.  Platelets control bleeding.  Plasma helps clot blood.  Other blood products are available for specialized needs, such as hemophilia or other clotting disorders. BEFORE THE TRANSFUSION  Who gives blood for transfusions?   Healthy volunteers who are fully evaluated to make sure their blood is safe. This is blood bank blood. Transfusion therapy is the safest it has ever been in the practice of medicine. Before blood is taken from a donor, a complete history is taken to make sure that person has no history of diseases nor engages in risky social behavior (examples are intravenous drug use or sexual activity with multiple partners). The donor's travel history is screened to minimize risk of transmitting infections, such as malaria. The donated blood is tested for signs of infectious diseases, such as HIV and hepatitis. The blood is then tested to be sure it is compatible with you in order to minimize the chance of a transfusion reaction. If you or a relative donates blood, this is often done in anticipation of surgery and is not appropriate for emergency situations.  It takes many days to process the donated blood. RISKS AND COMPLICATIONS Although transfusion therapy is very safe and saves many lives, the main dangers of transfusion include:   Getting an infectious disease.  Developing a transfusion reaction. This is an allergic reaction to something in the blood you  were given. Every precaution is taken to prevent this. The decision to have a blood transfusion has been considered carefully by your caregiver before blood is given. Blood is not given unless the benefits outweigh the risks. AFTER THE TRANSFUSION  Right after receiving a blood transfusion, you will usually feel much better and more energetic. This is especially true if your red blood cells have gotten low (anemic). The transfusion raises the level of the red blood cells which carry oxygen, and this usually causes an energy increase.  The nurse administering the transfusion will monitor you carefully for complications. HOME CARE INSTRUCTIONS  No special instructions are needed after a transfusion. You may find your energy is better. Speak with your caregiver about any limitations on activity for underlying diseases you may have. SEEK MEDICAL CARE IF:   Your condition is not improving after your transfusion.  You develop redness or irritation at the intravenous (IV) site. SEEK IMMEDIATE MEDICAL CARE IF:  Any of the following symptoms occur over the next 12 hours:  Shaking chills.  You have a temperature by mouth above 102 F (38.9 C), not controlled by medicine.  Chest, back, or muscle pain.  People around you feel you are not acting correctly or are confused.  Shortness of breath or difficulty breathing.  Dizziness and fainting.  You get a rash or develop hives.  You have a decrease in urine output.  Your urine turns a dark color or changes to pink, red, or brown. Any of the following symptoms occur over the next 10 days:  You have a temperature by mouth above 102 F (38.9 C), not controlled by medicine.  Shortness of breath.  Weakness after normal activity.  The white part of the eye turns yellow (jaundice).  You have a decrease in the amount of urine or are urinating less often.  Your urine turns a dark color or changes to pink, red, or brown. Document Released:  06/09/2000 Document Revised: 09/04/2011 Document Reviewed: 01/27/2008 [facility] Patient Information 2014 [place], [place].  _______________________________________________________________________

## 2019-10-02 ENCOUNTER — Encounter (HOSPITAL_COMMUNITY)
Admission: RE | Admit: 2019-10-02 | Discharge: 2019-10-02 | Disposition: A | Discharge status: Home / Self Care | Payer: 59 | Source: Ambulatory Visit | Attending: Surgery | Admitting: Surgery

## 2019-10-02 ENCOUNTER — Encounter (HOSPITAL_COMMUNITY): Payer: Self-pay

## 2019-10-02 ENCOUNTER — Other Ambulatory Visit: Payer: Self-pay

## 2019-10-02 HISTORY — DX: Other complications of anesthesia, initial encounter: T88.59XA

## 2019-10-02 HISTORY — DX: Gastro-esophageal reflux disease without esophagitis: K21.9

## 2019-10-02 HISTORY — DX: Other specified disorders of kidney and ureter: N28.89

## 2019-10-02 HISTORY — DX: Depression, unspecified: F32.A

## 2019-10-02 HISTORY — DX: Anxiety disorder, unspecified: F41.9

## 2019-10-02 NOTE — Progress Notes (Addendum)
PCP - Dr. Deloris Ping Cardiologist - N/A  Chest x-ray -  09/30/19 in epic EKG -  N/A Stress Test -  N/A ECHO -  N/A Cardiac Cath -  N/A  Sleep Study -  N/A CPAP -  N/A  Fasting Blood Sugar -  N/A Checks Blood Sugar __ N/A___ times a day  Blood Thinner Instructions:  N/A Aspirin Instructions:  Yes Last Dose: 10/04/19  Anesthesia review:  N/A  Patient denies shortness of breath, fever, cough and chest pain at PAT appointment   Patient verbalized understanding of instructions that were given to them at the PAT appointment. Patient was also instructed that they will need to review over the PAT instructions again at home before surgery.

## 2019-10-03 ENCOUNTER — Encounter (HOSPITAL_COMMUNITY)
Admission: RE | Admit: 2019-10-03 | Discharge: 2019-10-03 | Disposition: A | Discharge status: Home / Self Care | Payer: 59 | Source: Ambulatory Visit | Attending: Surgery | Admitting: Surgery

## 2019-10-03 DIAGNOSIS — Z01812 Encounter for preprocedural laboratory examination: Secondary | ICD-10-CM | POA: Insufficient documentation

## 2019-10-03 LAB — CBC
HCT: 50.6 % (ref 39.0–52.0)
Hemoglobin: 16.2 g/dL (ref 13.0–17.0)
MCH: 27.5 pg (ref 26.0–34.0)
MCHC: 32 g/dL (ref 30.0–36.0)
MCV: 85.8 fL (ref 80.0–100.0)
Platelets: 254 10*3/uL (ref 150–400)
RBC: 5.9 MIL/uL — ABNORMAL HIGH (ref 4.22–5.81)
RDW: 23.1 % — ABNORMAL HIGH (ref 11.5–15.5)
WBC: 4.2 10*3/uL (ref 4.0–10.5)
nRBC: 0 % (ref 0.0–0.2)

## 2019-10-03 LAB — BASIC METABOLIC PANEL
Anion gap: 10 (ref 5–15)
BUN: 23 mg/dL (ref 8–23)
CO2: 26 mmol/L (ref 22–32)
Calcium: 9.1 mg/dL (ref 8.9–10.3)
Chloride: 104 mmol/L (ref 98–111)
Creatinine, Ser: 0.99 mg/dL (ref 0.61–1.24)
GFR calc Af Amer: >60 mL/min (ref >60)
GFR calc non Af Amer: >60 mL/min (ref >60)
Glucose, Bld: 124 mg/dL — ABNORMAL HIGH (ref 70–99)
Potassium: 4 mmol/L (ref 3.5–5.1)
Sodium: 140 mmol/L (ref 135–145)

## 2019-10-03 LAB — PROTIME-INR
INR: 1 (ref 0.8–1.2)
Prothrombin Time: 13 seconds (ref 11.4–15.2)

## 2019-10-03 LAB — ABO/RH: ABO/RH(D): B POS

## 2019-10-07 ENCOUNTER — Other Ambulatory Visit (HOSPITAL_COMMUNITY)
Admission: RE | Admit: 2019-10-07 | Discharge: 2019-10-07 | Disposition: A | Discharge status: Home / Self Care | Payer: 59 | Source: Ambulatory Visit | Attending: Urology | Admitting: Urology

## 2019-10-07 DIAGNOSIS — Z20822 Contact with and (suspected) exposure to covid-19: Secondary | ICD-10-CM | POA: Diagnosis not present

## 2019-10-07 DIAGNOSIS — Z01812 Encounter for preprocedural laboratory examination: Secondary | ICD-10-CM | POA: Diagnosis present

## 2019-10-07 LAB — SARS CORONAVIRUS 2 (TAT 6-24 HRS): SARS Coronavirus 2: NEGATIVE

## 2019-10-10 ENCOUNTER — Observation Stay (HOSPITAL_COMMUNITY)
Admission: RE | Admit: 2019-10-10 | Discharge: 2019-10-12 | Disposition: A | Discharge status: Home / Self Care | Payer: 59 | Source: Other Acute Inpatient Hospital | Attending: Urology | Admitting: Urology

## 2019-10-10 ENCOUNTER — Encounter (HOSPITAL_COMMUNITY): Payer: Self-pay | Admitting: Urology

## 2019-10-10 ENCOUNTER — Encounter (HOSPITAL_COMMUNITY)
Admission: RE | Disposition: A | Discharge status: Home / Self Care | Payer: Self-pay | Source: Other Acute Inpatient Hospital | Attending: Urology

## 2019-10-10 ENCOUNTER — Ambulatory Visit (HOSPITAL_COMMUNITY): Payer: 59 | Admitting: Certified Registered"

## 2019-10-10 ENCOUNTER — Other Ambulatory Visit: Payer: Self-pay

## 2019-10-10 DIAGNOSIS — N2889 Other specified disorders of kidney and ureter: Principal | ICD-10-CM | POA: Insufficient documentation

## 2019-10-10 DIAGNOSIS — Z7982 Long term (current) use of aspirin: Secondary | ICD-10-CM | POA: Insufficient documentation

## 2019-10-10 DIAGNOSIS — Z87891 Personal history of nicotine dependence: Secondary | ICD-10-CM | POA: Insufficient documentation

## 2019-10-10 HISTORY — PX: ROBOTIC ASSITED PARTIAL NEPHRECTOMY: SHX6087

## 2019-10-10 LAB — TYPE AND SCREEN
ABO/RH(D): B POS
Antibody Screen: NEGATIVE

## 2019-10-10 LAB — HEMOGLOBIN AND HEMATOCRIT, BLOOD
HCT: 47.7 % (ref 39.0–52.0)
Hemoglobin: 15.4 g/dL (ref 13.0–17.0)

## 2019-10-10 SURGERY — NEPHRECTOMY, PARTIAL, ROBOT-ASSISTED
Anesthesia: General | Laterality: Right

## 2019-10-10 MED ORDER — ROCURONIUM BROMIDE 10 MG/ML (PF) SYRINGE
PREFILLED_SYRINGE | INTRAVENOUS | Status: AC
  Filled 2019-10-10: qty 10

## 2019-10-10 MED ORDER — PHENYLEPHRINE 40 MCG/ML (10ML) SYRINGE FOR IV PUSH (FOR BLOOD PRESSURE SUPPORT)
PREFILLED_SYRINGE | INTRAVENOUS | Status: AC
  Filled 2019-10-10: qty 10

## 2019-10-10 MED ORDER — PHENYLEPHRINE HCL-NACL 10-0.9 MG/250ML-% IV SOLN
INTRAVENOUS | Status: DC | PRN
  Administered 2019-10-10: 50 ug/min via INTRAVENOUS

## 2019-10-10 MED ORDER — PHENYLEPHRINE 40 MCG/ML (10ML) SYRINGE FOR IV PUSH (FOR BLOOD PRESSURE SUPPORT)
PREFILLED_SYRINGE | INTRAVENOUS | Status: DC | PRN
  Administered 2019-10-10: 160 ug via INTRAVENOUS
  Administered 2019-10-10 (×2): 120 ug via INTRAVENOUS

## 2019-10-10 MED ORDER — HYDROMORPHONE HCL 1 MG/ML IJ SOLN
0.5000 mg | INTRAMUSCULAR | Status: DC | PRN
  Administered 2019-10-11 – 2019-10-12 (×2): 0.5 mg via INTRAVENOUS
  Filled 2019-10-10 (×2): qty 1

## 2019-10-10 MED ORDER — BACITRACIN-NEOMYCIN-POLYMYXIN 400-5-5000 EX OINT: 1.0000 "application " | TOPICAL_OINTMENT | Freq: Three times a day (TID) | CUTANEOUS | Status: DC | PRN

## 2019-10-10 MED ORDER — PROPOFOL 10 MG/ML IV BOLUS
INTRAVENOUS | Status: DC | PRN
  Administered 2019-10-10: 150 mg via INTRAVENOUS

## 2019-10-10 MED ORDER — DOCUSATE SODIUM 100 MG PO CAPS
100.0000 mg | ORAL_CAPSULE | Freq: Two times a day (BID) | ORAL | Status: DC
  Administered 2019-10-10 – 2019-10-12 (×4): 100 mg via ORAL
  Filled 2019-10-10 (×4): qty 1

## 2019-10-10 MED ORDER — HYDROMORPHONE HCL 1 MG/ML IJ SOLN
INTRAMUSCULAR | Status: AC
  Filled 2019-10-10: qty 1

## 2019-10-10 MED ORDER — CEFAZOLIN SODIUM-DEXTROSE 1-4 GM/50ML-% IV SOLN
1.0000 g | Freq: Three times a day (TID) | INTRAVENOUS | Status: AC
  Administered 2019-10-10 – 2019-10-11 (×2): 1 g via INTRAVENOUS
  Filled 2019-10-10 (×2): qty 50

## 2019-10-10 MED ORDER — SUGAMMADEX SODIUM 200 MG/2ML IV SOLN
INTRAVENOUS | Status: DC | PRN
  Administered 2019-10-10: 180 mg via INTRAVENOUS

## 2019-10-10 MED ORDER — PHENYLEPHRINE HCL (PRESSORS) 10 MG/ML IV SOLN
INTRAVENOUS | Status: AC
  Filled 2019-10-10: qty 1

## 2019-10-10 MED ORDER — HYDROMORPHONE HCL 1 MG/ML IJ SOLN
0.2500 mg | INTRAMUSCULAR | Status: DC | PRN
  Administered 2019-10-10: 0.5 mg via INTRAVENOUS

## 2019-10-10 MED ORDER — OXYCODONE HCL 5 MG PO TABS
5.0000 mg | ORAL_TABLET | ORAL | Status: DC | PRN
  Administered 2019-10-12 (×3): 5 mg via ORAL
  Filled 2019-10-10 (×3): qty 1

## 2019-10-10 MED ORDER — EPHEDRINE SULFATE-NACL 50-0.9 MG/10ML-% IV SOSY
PREFILLED_SYRINGE | INTRAVENOUS | Status: DC | PRN
  Administered 2019-10-10 (×3): 10 mg via INTRAVENOUS
  Administered 2019-10-10: 15 mg via INTRAVENOUS

## 2019-10-10 MED ORDER — STERILE WATER FOR IRRIGATION IR SOLN
Status: DC | PRN
  Administered 2019-10-10: 1000 mL

## 2019-10-10 MED ORDER — ONDANSETRON HCL 4 MG/2ML IJ SOLN
INTRAMUSCULAR | Status: AC
  Filled 2019-10-10: qty 2

## 2019-10-10 MED ORDER — DEXTROSE-NACL 5-0.45 % IV SOLN: INTRAVENOUS | Status: DC

## 2019-10-10 MED ORDER — PROPOFOL 10 MG/ML IV BOLUS
INTRAVENOUS | Status: AC
  Filled 2019-10-10: qty 20

## 2019-10-10 MED ORDER — LACTATED RINGERS IV SOLN: INTRAVENOUS | Status: DC

## 2019-10-10 MED ORDER — DEXAMETHASONE SODIUM PHOSPHATE 10 MG/ML IJ SOLN
INTRAMUSCULAR | Status: DC | PRN
  Administered 2019-10-10: 8 mg via INTRAVENOUS

## 2019-10-10 MED ORDER — DEXAMETHASONE SODIUM PHOSPHATE 10 MG/ML IJ SOLN
INTRAMUSCULAR | Status: AC
  Filled 2019-10-10: qty 1

## 2019-10-10 MED ORDER — SERTRALINE HCL 50 MG PO TABS
50.0000 mg | ORAL_TABLET | Freq: Every day | ORAL | Status: DC
  Administered 2019-10-10 – 2019-10-12 (×3): 50 mg via ORAL
  Filled 2019-10-10 (×3): qty 1

## 2019-10-10 MED ORDER — MIDAZOLAM HCL 2 MG/2ML IJ SOLN
INTRAMUSCULAR | Status: AC
  Filled 2019-10-10: qty 2

## 2019-10-10 MED ORDER — DOCUSATE SODIUM 100 MG PO CAPS: 100.0000 mg | ORAL_CAPSULE | Freq: Two times a day (BID) | ORAL | Status: DC

## 2019-10-10 MED ORDER — HYDROXYUREA 500 MG PO CAPS: 500.0000 mg | ORAL_CAPSULE | Freq: Two times a day (BID) | ORAL | Status: DC

## 2019-10-10 MED ORDER — DIPHENHYDRAMINE HCL 50 MG/ML IJ SOLN: 12.5000 mg | Freq: Four times a day (QID) | INTRAMUSCULAR | Status: DC | PRN

## 2019-10-10 MED ORDER — BUPIVACAINE HCL 0.25 % IJ SOLN
INTRAMUSCULAR | Status: AC
  Filled 2019-10-10: qty 1

## 2019-10-10 MED ORDER — HYDROCODONE-ACETAMINOPHEN 5-325 MG PO TABS: 1.0000 | ORAL_TABLET | Freq: Four times a day (QID) | ORAL | 0 refills | Status: DC | PRN

## 2019-10-10 MED ORDER — ONDANSETRON HCL 4 MG/2ML IJ SOLN
INTRAMUSCULAR | Status: DC | PRN
  Administered 2019-10-10: 4 mg via INTRAVENOUS

## 2019-10-10 MED ORDER — BUPIVACAINE LIPOSOME 1.3 % IJ SUSP
20.0000 mL | Freq: Once | INTRAMUSCULAR | Status: AC
  Administered 2019-10-10: 15:00:00 20 mL
  Filled 2019-10-10: qty 20

## 2019-10-10 MED ORDER — LACTATED RINGERS IR SOLN
Status: DC | PRN
  Administered 2019-10-10: 1

## 2019-10-10 MED ORDER — EPHEDRINE 5 MG/ML INJ
INTRAVENOUS | Status: AC
  Filled 2019-10-10: qty 10

## 2019-10-10 MED ORDER — ACETAMINOPHEN 500 MG PO TABS
1000.0000 mg | ORAL_TABLET | Freq: Four times a day (QID) | ORAL | Status: AC
  Administered 2019-10-10 – 2019-10-11 (×4): 1000 mg via ORAL
  Filled 2019-10-10 (×4): qty 2

## 2019-10-10 MED ORDER — PROMETHAZINE HCL 12.5 MG PO TABS: 12.5000 mg | ORAL_TABLET | ORAL | 0 refills | Status: DC | PRN

## 2019-10-10 MED ORDER — DIPHENHYDRAMINE HCL 12.5 MG/5ML PO ELIX: 12.5000 mg | ORAL_SOLUTION | Freq: Four times a day (QID) | ORAL | Status: DC | PRN

## 2019-10-10 MED ORDER — MIDAZOLAM HCL 2 MG/2ML IJ SOLN
INTRAMUSCULAR | Status: DC | PRN
  Administered 2019-10-10: 2 mg via INTRAVENOUS

## 2019-10-10 MED ORDER — BUPIVACAINE HCL (PF) 0.25 % IJ SOLN
INTRAMUSCULAR | Status: DC | PRN
  Administered 2019-10-10: 30 mL

## 2019-10-10 MED ORDER — FENTANYL CITRATE (PF) 100 MCG/2ML IJ SOLN
INTRAMUSCULAR | Status: DC | PRN
  Administered 2019-10-10: 50 ug via INTRAVENOUS
  Administered 2019-10-10: 100 ug via INTRAVENOUS
  Administered 2019-10-10: 50 ug via INTRAVENOUS

## 2019-10-10 MED ORDER — CHLORHEXIDINE GLUCONATE CLOTH 2 % EX PADS: 6.0000 | MEDICATED_PAD | Freq: Every day | CUTANEOUS | Status: DC

## 2019-10-10 MED ORDER — ONDANSETRON HCL 4 MG/2ML IJ SOLN
4.0000 mg | INTRAMUSCULAR | Status: DC | PRN
  Administered 2019-10-10 – 2019-10-11 (×2): 4 mg via INTRAVENOUS
  Filled 2019-10-10 (×2): qty 2

## 2019-10-10 MED ORDER — ROCURONIUM BROMIDE 10 MG/ML (PF) SYRINGE
PREFILLED_SYRINGE | INTRAVENOUS | Status: DC | PRN
  Administered 2019-10-10 (×2): 10 mg via INTRAVENOUS
  Administered 2019-10-10: 80 mg via INTRAVENOUS

## 2019-10-10 MED ORDER — MEPERIDINE HCL 50 MG/ML IJ SOLN: 6.2500 mg | INTRAMUSCULAR | Status: DC | PRN

## 2019-10-10 MED ORDER — HYDROXYUREA 500 MG PO CAPS
500.0000 mg | ORAL_CAPSULE | Freq: Two times a day (BID) | ORAL | Status: DC
  Administered 2019-10-10 – 2019-10-12 (×4): 500 mg via ORAL
  Filled 2019-10-10 (×4): qty 1

## 2019-10-10 MED ORDER — BELLADONNA ALKALOIDS-OPIUM 16.2-60 MG RE SUPP: 1.0000 | Freq: Four times a day (QID) | RECTAL | Status: DC | PRN

## 2019-10-10 MED ORDER — "VISTASEAL 4 ML SINGLE DOSE KIT "
4.0000 mL | PACK | Freq: Once | CUTANEOUS | Status: AC
  Administered 2019-10-10: 4 mL via TOPICAL
  Filled 2019-10-10: qty 4

## 2019-10-10 MED ORDER — ACETAMINOPHEN 500 MG PO TABS: 1000.0000 mg | ORAL_TABLET | Freq: Four times a day (QID) | ORAL | Status: DC

## 2019-10-10 MED ORDER — FENTANYL CITRATE (PF) 100 MCG/2ML IJ SOLN
INTRAMUSCULAR | Status: AC
  Filled 2019-10-10: qty 2

## 2019-10-10 MED ORDER — CEFAZOLIN SODIUM-DEXTROSE 2-4 GM/100ML-% IV SOLN
2.0000 g | Freq: Once | INTRAVENOUS | Status: AC
  Administered 2019-10-10: 13:00:00 2 g via INTRAVENOUS
  Filled 2019-10-10: qty 100

## 2019-10-10 MED ORDER — LIDOCAINE 2% (20 MG/ML) 5 ML SYRINGE
INTRAMUSCULAR | Status: DC | PRN
  Administered 2019-10-10 (×2): 50 mg via INTRAVENOUS

## 2019-10-10 MED ORDER — PANTOPRAZOLE SODIUM 40 MG PO TBEC
40.0000 mg | DELAYED_RELEASE_TABLET | Freq: Every day | ORAL | Status: DC
  Administered 2019-10-11 – 2019-10-12 (×2): 40 mg via ORAL
  Filled 2019-10-10 (×2): qty 1

## 2019-10-10 MED ORDER — ONDANSETRON HCL 4 MG/2ML IJ SOLN: 4.0000 mg | Freq: Once | INTRAMUSCULAR | Status: DC | PRN

## 2019-10-10 MED ORDER — LIDOCAINE 2% (20 MG/ML) 5 ML SYRINGE
INTRAMUSCULAR | Status: AC
  Filled 2019-10-10: qty 5

## 2019-10-10 SURGICAL SUPPLY — 67 items
APPLICATOR VISTASEAL 35 (MISCELLANEOUS) ×3 IMPLANT
CHLORAPREP W/TINT 26 (MISCELLANEOUS) ×3 IMPLANT
CLIP SUT LAPRA TY ABSORB (SUTURE) ×6 IMPLANT
CLIP VESOLOCK LG 6/CT PURPLE (CLIP) ×3 IMPLANT
CLIP VESOLOCK MED LG 6/CT (CLIP) ×6 IMPLANT
CLIP VESOLOCK XL 6/CT (CLIP) IMPLANT
COVER SURGICAL LIGHT HANDLE (MISCELLANEOUS) ×3 IMPLANT
COVER TIP SHEARS 8 DVNC (MISCELLANEOUS) ×1 IMPLANT
COVER TIP SHEARS 8MM DA VINCI (MISCELLANEOUS) ×2
COVER WAND RF STERILE (DRAPES) IMPLANT
CUTTER ECHEON FLEX ENDO 45 340 (ENDOMECHANICALS) IMPLANT
DECANTER SPIKE VIAL GLASS SM (MISCELLANEOUS) ×3 IMPLANT
DERMABOND ADVANCED (GAUZE/BANDAGES/DRESSINGS) ×2
DERMABOND ADVANCED .7 DNX12 (GAUZE/BANDAGES/DRESSINGS) ×1 IMPLANT
DRAIN CHANNEL 15F RND FF 3/16 (WOUND CARE) IMPLANT
DRAPE ARM DVNC X/XI (DISPOSABLE) ×4 IMPLANT
DRAPE COLUMN DVNC XI (DISPOSABLE) ×1 IMPLANT
DRAPE DA VINCI XI ARM (DISPOSABLE) ×8
DRAPE DA VINCI XI COLUMN (DISPOSABLE) ×2
DRAPE INCISE IOBAN 66X45 STRL (DRAPES) ×3 IMPLANT
DRAPE SHEET LG 3/4 BI-LAMINATE (DRAPES) ×3 IMPLANT
ELECT REM PT RETURN 15FT ADLT (MISCELLANEOUS) ×3 IMPLANT
EVACUATOR SILICONE 100CC (DRAIN) IMPLANT
GLOVE BIO SURGEON STRL SZ 6.5 (GLOVE) ×2 IMPLANT
GLOVE BIO SURGEONS STRL SZ 6.5 (GLOVE) ×1
GLOVE BIOGEL M STRL SZ7.5 (GLOVE) ×6 IMPLANT
GLOVE BIOGEL PI IND STRL 8 (GLOVE) ×2 IMPLANT
GLOVE BIOGEL PI INDICATOR 8 (GLOVE) ×4
GOWN STRL REUS W/TWL LRG LVL3 (GOWN DISPOSABLE) ×3 IMPLANT
GOWN STRL REUS W/TWL XL LVL3 (GOWN DISPOSABLE) ×6 IMPLANT
HEMOSTAT SURGICEL 4X8 (HEMOSTASIS) ×3 IMPLANT
IRRIG SUCT STRYKERFLOW 2 WTIP (MISCELLANEOUS) ×3
IRRIGATION SUCT STRKRFLW 2 WTP (MISCELLANEOUS) ×1 IMPLANT
KIT BASIN OR (CUSTOM PROCEDURE TRAY) ×3 IMPLANT
KIT TURNOVER KIT A (KITS) IMPLANT
MARKER SKIN DUAL TIP RULER LAB (MISCELLANEOUS) ×3 IMPLANT
NEEDLE INSUFFLATION 14GA 120MM (NEEDLE) ×3 IMPLANT
PENCIL SMOKE EVACUATOR (MISCELLANEOUS) IMPLANT
POUCH SPECIMEN RETRIEVAL 10MM (ENDOMECHANICALS) ×3 IMPLANT
PROTECTOR NERVE ULNAR (MISCELLANEOUS) ×6 IMPLANT
SEAL CANN UNIV 5-8 DVNC XI (MISCELLANEOUS) ×3 IMPLANT
SEAL XI 5MM-8MM UNIVERSAL (MISCELLANEOUS) ×6
SEALANT SURGICAL APPL DUAL CAN (MISCELLANEOUS) IMPLANT
SET TUBE SMOKE EVAC HIGH FLOW (TUBING) ×3 IMPLANT
SOLUTION ELECTROLUBE (MISCELLANEOUS) ×3 IMPLANT
STAPLE RELOAD 45 WHT (STAPLE) IMPLANT
STAPLE RELOAD 45MM WHITE (STAPLE)
SUT ETHILON 2 0 PSLX (SUTURE) IMPLANT
SUT MNCRL AB 4-0 PS2 18 (SUTURE) ×6 IMPLANT
SUT PDS AB 0 CT1 36 (SUTURE) IMPLANT
SUT V-LOC BARB 180 2/0GR6 GS22 (SUTURE)
SUT VIC AB 1 CT1 27 (SUTURE) ×8
SUT VIC AB 1 CT1 27XBRD ANTBC (SUTURE) ×4 IMPLANT
SUT VIC AB 2-0 SH 27 (SUTURE) ×2
SUT VIC AB 2-0 SH 27X BRD (SUTURE) ×1 IMPLANT
SUT VICRYL 0 UR6 27IN ABS (SUTURE) IMPLANT
SUT VLOC BARB 180 ABS3/0GR12 (SUTURE) ×6
SUTURE V-LC BRB 180 2/0GR6GS22 (SUTURE) IMPLANT
SUTURE VLOC BRB 180 ABS3/0GR12 (SUTURE) ×2 IMPLANT
TOWEL OR 17X26 10 PK STRL BLUE (TOWEL DISPOSABLE) ×3 IMPLANT
TOWEL OR NON WOVEN STRL DISP B (DISPOSABLE) ×3 IMPLANT
TRAY FOLEY MTR SLVR 16FR STAT (SET/KITS/TRAYS/PACK) ×3 IMPLANT
TRAY LAPAROSCOPIC (CUSTOM PROCEDURE TRAY) ×3 IMPLANT
TROCAR BLADELESS OPT 5 100 (ENDOMECHANICALS) IMPLANT
TROCAR UNIVERSAL OPT 12M 100M (ENDOMECHANICALS) IMPLANT
TROCAR XCEL 12X100 BLDLESS (ENDOMECHANICALS) ×3 IMPLANT
WATER STERILE IRR 1000ML POUR (IV SOLUTION) ×3 IMPLANT

## 2019-10-10 NOTE — Anesthesia Preprocedure Evaluation (Addendum)
Anesthesia Evaluation  Patient identified by MRN, date of birth, ID band Patient awake    Reviewed: Allergy & Precautions, NPO status , Patient's Chart, lab work & pertinent test results  Airway Mallampati: I  TM Distance: >3 FB Neck ROM: Full    Dental  (+) Teeth Intact, Dental Advisory Given   Pulmonary former smoker,    Pulmonary exam normal breath sounds clear to auscultation       Cardiovascular Normal cardiovascular exam Rhythm:Regular Rate:Normal     Neuro/Psych PSYCHIATRIC DISORDERS Anxiety Depression    GI/Hepatic GERD  Medicated and Controlled,  Endo/Other    Renal/GU      Musculoskeletal   Abdominal   Peds  Hematology  (+) Blood dyscrasia (polycythemia vera on hydroxyurea), ,   Anesthesia Other Findings   Reproductive/Obstetrics                            Anesthesia Physical Anesthesia Plan  ASA: II  Anesthesia Plan: General   Post-op Pain Management:    Induction: Intravenous  PONV Risk Score and Plan: 2 and Ondansetron, Dexamethasone and Midazolam  Airway Management Planned: Oral ETT  Additional Equipment:   Intra-op Plan:   Post-operative Plan: Extubation in OR  Informed Consent: I have reviewed the patients History and Physical, chart, labs and discussed the procedure including the risks, benefits and alternatives for the proposed anesthesia with the patient or authorized representative who has indicated his/her understanding and acceptance.       Plan Discussed with: CRNA and Surgeon  Anesthesia Plan Comments:        Anesthesia Quick Evaluation

## 2019-10-10 NOTE — Transfer of Care (Signed)
Immediate Anesthesia Transfer of Care Note  Patient: Thomas Hancock  Procedure(s) Performed: XI ROBOTIC ASSITED PARTIAL NEPHRECTOMY (Right )  Patient Location: PACU  Anesthesia Type:General  Level of Consciousness: awake, alert  and oriented  Airway & Oxygen Therapy: Patient Spontanous Breathing and Patient connected to face mask oxygen  Post-op Assessment: Report given to RN, Post -op Vital signs reviewed and stable and Patient moving all extremities X 4  Post vital signs: Reviewed and stable  Last Vitals:  Vitals Value Taken Time  BP 128/86 10/10/19 1507  Temp    Pulse 79 10/10/19 1507  Resp 14 10/10/19 1507  SpO2 100 % 10/10/19 1507  Vitals shown include unvalidated device data.  Last Pain:  Vitals:   10/10/19 1055  TempSrc:   PainSc: 3          Complications: No apparent anesthesia complications

## 2019-10-10 NOTE — Interval H&P Note (Signed)
History and Physical Interval Note:  10/10/2019 12:11 PM  Thomas Hancock  has presented today for surgery, with the diagnosis of RIGHT RENAL MASS.  The various methods of treatment have been discussed with the patient and family. After consideration of risks, benefits and other options for treatment, the patient has consented to  Procedure(s): XI ROBOTIC ASSITED PARTIAL NEPHRECTOMY (Right) as a surgical intervention.  The patient's history has been reviewed, patient examined, no change in status, stable for surgery.  I have reviewed the patient's chart and labs.  Questions were answered to the patient's satisfaction.     Thomas Hancock

## 2019-10-10 NOTE — Op Note (Signed)
Operative Note  Preoperative diagnosis:  1.  1.9 cm right renal mass  Postoperative diagnosis: 1.  1.9 cm right renal mass  Procedure(s): 1.  Robot-assisted laparoscopic right partial nephrectomy  Surgeon: Ellison Hughs, MD  Assistants:  Debbrah Alar, [facility]  An assistant was required for this surgical procedure.  The duties of the assistant included but were not limited to suctioning, passing suture, camera manipulation, retraction.  This procedure would not be able to be performed without an Environmental consultant.   Anesthesia:  General  Complications:  None  EBL:  50 mL  Specimens: 1.  Right renal mass  Drains/Catheters: 1.  Foley catheter  Intraoperative findings:   1. 1.9 cm solid well-circumscribed right renal mass that was excised with grossly negative surgical margins.  Renorrhaphy was hemostatic at the conclusion of the case.  Indication:  Thomas Hancock is a 64 y.o. male with a solid and enhancing 1.9 cm right renal mass with features concerning for renal cell carcinoma.  He has been consented for the above procedures, voices understanding and wishes to proceed.  Description of procedure:  After informed consent was signed, the patient was taken back to the operating room and properly anesthetized.  The patient was then placed in the left lateral decubitus position with all pressure points padded.  The abdomen was then prepped and draped in the usual sterile fashion.  A time-out was then performed.    An 8 mm incision was then made lateral to the right rectus muscle at the level of the right 12th rib.  A Veress needle was then used to access the abdominal cavity.  A saline drop test showed no signs of obstruction and aspiration of the Veress needle revealed no blood or sucus.  The abdominal cavity was then insufflated to 15 mmHg.  An 8 mm robotic trocar was then atraumatically inserted into the abdominal cavity.  The robotic camera was then inserted through the port and  inspection of the abdominal cavity revealed no evidence of adjacent organ or vessel injury.  We then placed three additional 8 mm robotic ports, a 15 mm assistant port and a 5 mm subxiphoid port in such as fashion as to triangulate the right renal hilum.  The robot was then docked into postion.   Using a combination of blunt and cold scissors dissection, the hepatic attachments were released from the abdominal sidewall.  A locking grasper was then inserted through the 5 mm sub-xyphoid port and used to retract the posterior surface of the liver more cephalad.  The white line of Toldt along the ascending colon was then incised, allowing [place] to reflect the colon medially and expose the anterior surface of the right kidney.  The duodenum was then Kocherized medially, which abruptly led [place] to identification of the inferior vena cava.    Once the colon was adequately mobilized, we moved to the lower pole and identified the gonadal vein and ureter.  The gonadal vein was then left running parallel to the vena cava and the right ureter was reflected anteriorly.  Using cautious cautery, the overlying perihilar attachments were then released.  This yielded visualization of the renal hilum, which included a single right renal vein and a single right renal artery.  The perilymphatic tissue surrounding the right renal artery were carefully released so that the right renal artery was fully encircled.    We next turned our attention to defatting the kidney.  An anterior incision along Gerota's fascia was created and the kidney was  fully mobilized.   The renal mass is identified at the mid/lower pole.  Using intraoperative ultrasound, the tumor was then carefully evaluated and demonstrated heterogenous echogenicity compared to the rest of the renal parenchyma. The resection margin was marked to allow for wide excision of the renal mass.  The renal hilum was once again identified and a bulldog clamp was placed.    Using cold  scissors, the right renal mass was then carefully excised, leaving a grossly negative margin.  Excision of the mass appeared complete with no tumor grossly remaining.  The tumor was then placed in the right lower quadrant, to be retrieved following repair of the renal defect.  A running 3-0 V lock suture was then used to reapproximate the resection bed.  Tension was placed with hemo-lock clips.   The right renal capsule was then reapproximated using a 0 Vicryl suture on a CT-1 needle in an interrupted fashion using hemo-lock clips as a buttress.  Lapra-Ty's were then placed on each of the interrupted sutures.  The bulldog was then released, which warm ischemia time totaled 13 minutes.   Once hemostasis was achieved and the renal bed was irrigated, Surgicel and EVICEL was then placed over the renorrhaphy.  Gerota's fascia was then reapproximated with a running v-lok suture and the mesocolonic fat along the descending colon was then reapproximated to the right abdominal sidewall using Hem-o-lok clips.  The renal mass was retrieved and placed in an Endo Catch bag.  The mass was extracted through the 15 mm assistant port.  The fascia of the assistant port was then reapproximated with a 0 Vicryl suture.    The abdomen was desufflated with all ports removed.  The skin was then reapproximated using running Monocryl and dressed appropriately.  The patient was then replaced in the supine position and was awakened from anesthesia without complications.  Warm Ischemia Time: 13 minutes   Plan:  Bed rest overnight.  ADAT.  Foley out in the AM.

## 2019-10-10 NOTE — H&P (Signed)
PRE-OP H&P  Office Visit Report     09/16/2019   Thomas Hancock  MRN: [id number]  DOB: Apr 18, 1956, 64 year old Male    REFERRING:  Rudell Cobb. Marin Olp, MD  PROVIDER:  Ellison Hughs, M.D.  LOCATION:  [facility] [phone]    CC/HPI: CC: right renal mass   HPI: Thomas Hancock is a 64 year old male who was found to have a 1.9 cm solid and enhancing right renal mass with features concerning for RCC on MRI from 08/31/19. The patient has a history polycythemia vera and the mass was initially discovered on abdominal US on 08/20/19.   Last serum creatinine- 1.07 (08/2019)  eGFR- >60  Platelets- 436   1. Right renal mass- Mesophytic 1.9 cm right mid/lower pole. Right renal artery with early branch. Single renal vein. He denies flank pain, dysuria or hematuria. He states that he has no personal/family history of GU malignancies.     ALLERGIES: None   MEDICATIONS: Aspirin 81 mg tablet,chewable  Acetaminophen 500 mg capsule  Hydroxyurea 500 mg capsule  Pantoprazole Sodium 40 mg tablet, delayed release  Sertraline Hcl 50 mg tablet     GU PSH: None   NON-GU PSH: None   GU PMH: No GU PMH      PMH Notes: Polycythemia vera  R kidney mass  Esophageal dysphagia   NON-GU PMH: No Non-GU PMH    FAMILY HISTORY: 1 Daughter - No Family History 1 son - No Family History Diabetes - Mother   SOCIAL HISTORY: Marital Status: Married Preferred Language: English; Race: Other Race Current Smoking Status: Patient does not smoke anymore.   Tobacco Use Assessment Completed: Used Tobacco in last 30 days? Has never drank.  Drinks 3 caffeinated drinks per day.    REVIEW OF SYSTEMS:    GU Review Male:   Patient denies frequent urination, hard to postpone urination, burning/ pain with urination, get up at night to urinate, leakage of urine, stream starts and stops, trouble starting your stream, have to strain to urinate , erection problems, and penile pain.  Gastrointestinal (Upper):    Patient denies nausea, vomiting, and indigestion/ heartburn.  Gastrointestinal (Lower):   Patient denies diarrhea and constipation.  Constitutional:   Patient denies fever, night sweats, weight loss, and fatigue.  Skin:   Patient denies skin rash/ lesion and itching.  Eyes:   Patient denies blurred vision and double vision.  Ears/ Nose/ Throat:   Patient denies sore throat and sinus problems.  Hematologic/Lymphatic:   Patient denies swollen glands and easy bruising.  Cardiovascular:   Patient denies leg swelling and chest pains.  Respiratory:   Patient denies cough and shortness of breath.  Endocrine:   Patient denies excessive thirst.  Musculoskeletal:   Patient denies back pain and joint pain.  Neurological:   Patient denies headaches and dizziness.  Psychologic:   Patient denies depression and anxiety.   VITAL SIGNS:      09/16/2019 09:59 AM  Weight 180 lb / 81.65 kg  Height 72 in / 182.88 cm  BP 132/87 mmHg  Heart Rate 81 /min  Temperature 97.3 F / 36.2 C  BMI 24.4 kg/m   MULTI-SYSTEM PHYSICAL EXAMINATION:    Constitutional: Well-nourished. No physical deformities. Normally developed. Good grooming.  Neurologic / Psychiatric: Oriented to time, oriented to place, oriented to person. No depression, no anxiety, no agitation.  Musculoskeletal: Normal gait and station of head and neck.     PAST DATA REVIEWED:  Source Of  History:  Patient  Lab Test Review:   BMP  Records Review:   Previous Patient Records  X-Ray Review: MRI Abdomen: Reviewed Films. Reviewed Report. Discussed With Patient.    Notes:                     CLINICAL DATA: 64 year old male with history of abnormal ultrasound  suspicious for renal mass. Follow-up study.     EXAM:  MRI ABDOMEN WITHOUT AND WITH CONTRAST     TECHNIQUE:  Multiplanar multisequence MR imaging of the abdomen was performed  both before and after the administration of intravenous contrast.     CONTRAST: 7.43m GADAVIST GADOBUTROL 1  MMOL/ML IV SOLN     COMPARISON: No prior abdominal MRI. Abdominal ultrasound  08/20/2019.     FINDINGS:  Lower chest: No acute findings.     Hepatobiliary: No suspicious cystic or solid hepatic lesions. No  intra or extrahepatic biliary ductal dilatation. Gallbladder is  normal in appearance.     Pancreas: No pancreatic mass. No pancreatic ductal dilatation. No  pancreatic or peripancreatic fluid collections or inflammatory  changes.     Spleen: Unremarkable.     Adrenals/Urinary Tract: In the anterior aspect of the lower pole of  the right kidney there is a 1.9 x 1.5 x 1.8 cm lesion (axial image  35 of series 13 and coronal image 28 of series 17) which is T1  isointense, T2 isointense and demonstrates post gadolinium  enhancement with washout, compatible with a small renal neoplasm.  This lesion is encapsulated within Gerota's fascia and well  separated from the right renal vein which is widely patent. Multiple  other T1 hypointense, T2 hyperintense, nonenhancing lesions in both  kidneys, compatible with simple cysts, largest of which measures 3  cm in diameter. No hydroureteronephrosis visualized portions of the  abdomen. Bilateral adrenal glands are normal in appearance.     Stomach/Bowel: There visualized portions are unremarkable.     Vascular/Lymphatic: No aneurysm identified in the visualized  abdominal vasculature. No lymphadenopathy noted in the abdomen.     Other: No significant volume of ascites noted in the visualized  portions of the peritoneal cavity.     Musculoskeletal: No aggressive appearing osseous lesions are noted  in the visualized portions of the skeleton.     IMPRESSION:  1. 1.9 x 1.5 x 1.8 cm enhancing neoplasm in the lower pole of the  right kidney concerning for renal cell carcinoma. This is  encapsulated within Gerota's fascia and is well separated right  renal vein which is widely patent. No signs of metastatic disease in  the abdomen.  2.  Other small simple cysts in the kidneys bilaterally.        Electronically Signed  By: DVinnie LangtonM.D.  On: 08/31/2019 08:49   PROCEDURES:          Urinalysis - 81003 Dipstick Dipstick Cont'd  Color: Yellow Bilirubin: Neg  Appearance: Clear Ketones: Neg  Specific Gravity: 1.010 Blood: Neg  pH: 5.5 Protein: Neg  Glucose: Neg Urobilinogen: 0.2    Nitrites: Neg    Leukocyte Esterase: Neg    Notes:      ASSESSMENT:      ICD-10 Details  1 GU:   Right renal neoplasm - D49.511 Undiagnosed New Problem   PLAN:           Orders         Schedule X-Rays: 1 Week - Chest X-Ray Outside Without Contrast - The  patient would like to have his CXR done in [facility]   Return Visit/Planned Activity: ASAP - Schedule Surgery          Document Letter(s):  Created for Patient: Clinical Summary   Created for Rudell Cobb. Marin Olp, MD         Notes:   -I personally reviewed imaging results and films with the patient. We discussed that the mass in question has features concerning for malignancy. I explained the natural history of presumed renal cell carcinoma. I reviewed the AUA guidelines for evaluation and treatment of the small renal mass. The options of active surveillance, in situ tumor ablation, partial and radical nephrectomy was discussed.   The risks of robotic RIGHT partial nephrectomy were discussed in detail including but not limited to: negative pathology, open conversion, completion nephrectomy, infection of the urinary tract/skin/abdominal cavity, VTE, MI/CVA, lymphatic leak, injury to adjacent solid/hollow viscus organs, bleeding requiring a blood transfusion, catastrophic bleeding, hernia formation, need for postoperative angioembolization, urinary leak requiring stent/drain, and other imponderables.

## 2019-10-10 NOTE — Anesthesia Procedure Notes (Signed)
Procedure Name: Intubation Date/Time: 10/10/2019 12:45 PM Performed by: Niel Hummer, CRNA Pre-anesthesia Checklist: Patient identified, Emergency Drugs available, Suction available and Patient being monitored Patient Re-evaluated:Patient Re-evaluated prior to induction Oxygen Delivery Method: Circle system utilized Preoxygenation: Pre-oxygenation with 100% oxygen Induction Type: IV induction Ventilation: Mask ventilation with difficulty, Two handed mask ventilation required and Oral airway inserted - appropriate to patient size Laryngoscope Size: Mac and 4 Grade View: Grade I Tube type: Oral Tube size: 7.5 mm Number of attempts: 1 Airway Equipment and Method: Stylet Placement Confirmation: ETT inserted through vocal cords under direct vision,  positive ETCO2 and breath sounds checked- equal and bilateral Secured at: 22 cm Tube secured with: Tape Dental Injury: Teeth and Oropharynx as per pre-operative assessment

## 2019-10-11 DIAGNOSIS — N2889 Other specified disorders of kidney and ureter: Secondary | ICD-10-CM | POA: Diagnosis not present

## 2019-10-11 LAB — HEMOGLOBIN AND HEMATOCRIT, BLOOD
HCT: 44.7 % (ref 39.0–52.0)
Hemoglobin: 14.6 g/dL (ref 13.0–17.0)

## 2019-10-11 LAB — BASIC METABOLIC PANEL
Anion gap: 6 (ref 5–15)
BUN: 17 mg/dL (ref 8–23)
CO2: 30 mmol/L (ref 22–32)
Calcium: 8.4 mg/dL — ABNORMAL LOW (ref 8.9–10.3)
Chloride: 99 mmol/L (ref 98–111)
Creatinine, Ser: 1.11 mg/dL (ref 0.61–1.24)
GFR calc Af Amer: >60 mL/min (ref >60)
GFR calc non Af Amer: >60 mL/min (ref >60)
Glucose, Bld: 162 mg/dL — ABNORMAL HIGH (ref 70–99)
Potassium: 4.3 mmol/L (ref 3.5–5.1)
Sodium: 135 mmol/L (ref 135–145)

## 2019-10-11 NOTE — Progress Notes (Signed)
1 Day Post-Op Subjective: Patient reports mild abdominal pain. Hgb 14.6. urine clear negative flatus.  Objective: Vital signs in last 24 hours: Temp:  [97.5 F (36.4 C)-98.6 F (37 C)] 97.5 F (36.4 C) (04/17 1613) Pulse Rate:  [72-97] 80 (04/17 1613) Resp:  [18-20] 18 (04/17 1613) BP: (96-128)/(71-87) 110/85 (04/17 1613) SpO2:  [100 %] 100 % (04/17 1613)  Intake/Output from previous day: 04/16 0701 - 04/17 0700 In: 3161.9 [P.O.:60; I.V.:2901.9; IV Piggyback:200] Out: 1900 [Urine:1650; Emesis/NG output:200; Blood:50] Intake/Output this shift: No intake/output data recorded.  Physical Exam:  General:alert, cooperative and appears stated age GI: soft, non tender, normal bowel sounds, no palpable masses, no organomegaly, no inguinal hernia Male genitalia: not done Extremities: extremities normal, atraumatic, no cyanosis or edema  Lab Results: Recent Labs    10/10/19 1525 10/11/19 0547  HGB 15.4 14.6  HCT 47.7 44.7   BMET Recent Labs    10/11/19 0547  NA 135  K 4.3  CL 99  CO2 30  GLUCOSE 162*  BUN 17  CREATININE 1.11  CALCIUM 8.4*   No results for input(s): LABPT, INR in the last 72 hours. No results for input(s): LABURIN in the last 72 hours. Results for orders placed or performed during the hospital encounter of 10/07/19  SARS CORONAVIRUS 2 (TAT 6-24 HRS) Nasopharyngeal Nasopharyngeal Swab     Status: None   Collection Time: 10/07/19  8:40 AM   Specimen: Nasopharyngeal Swab  Result Value Ref Range Status   SARS Coronavirus 2 NEGATIVE NEGATIVE Final    Comment: (NOTE) SARS-CoV-2 target nucleic acids are NOT DETECTED. The SARS-CoV-2 RNA is generally detectable in upper and lower respiratory specimens during the acute phase of infection. Negative results do not preclude SARS-CoV-2 infection, do not rule out co-infections with other pathogens, and should not be used as the sole basis for treatment or other patient management decisions. Negative results must  be combined with clinical observations, patient history, and epidemiological information. The expected result is Negative. Fact Sheet for Patients: SugarRoll.be Fact Sheet for Healthcare Providers: https://www.woods-mathews.com/ This test is not yet approved or cleared by the [place] FDA and  has been authorized for detection and/or diagnosis of SARS-CoV-2 by FDA under an Emergency Use Authorization (EUA). This EUA will remain  in effect (meaning this test can be used) for the duration of the COVID-19 declaration under Section 56 4(b)(1) of the Act, 21 U.S.C. section 360bbb-3(b)(1), unless the authorization is terminated or revoked sooner. Performed at [facility], [place] [address], [place], [place] [address]     Studies/Results: No results found.  Assessment/Plan: POD#1 right robotic partial nephrectomy  1. Advance diet as tolerate 2. Off bedrest, ambulate with assistance 3. D/c foley   LOS: 0 days   Nicolette Bang 10/11/2019, 7:04 PM

## 2019-10-12 DIAGNOSIS — N2889 Other specified disorders of kidney and ureter: Secondary | ICD-10-CM | POA: Diagnosis not present

## 2019-10-12 NOTE — Progress Notes (Signed)
D/C pt home with family. No changes in assessment. Pt condition stable at time of d/c. Discussed the importance of not taking more than 4000 mg of tylenol in a 24 hour period. Pt and family(wife and daughter verbalized understanding.

## 2019-10-12 NOTE — Discharge Instructions (Signed)

## 2019-10-12 NOTE — Progress Notes (Signed)
Dr. Alyson Ingles paged to notify that patient is wondering about discharge. Dr. Alyson Ingles stated he would place orders shortly. Patient and family updated.

## 2019-10-12 NOTE — Progress Notes (Signed)
2 Days Post-Op Subjective: Patient reports mild abdominal pain last night but none this morning.  urine clear positive flatus. No nausea/vomiting  Objective: Vital signs in last 24 hours: Temp:  [97.5 F (36.4 C)-99.1 F (37.3 C)] 98.7 F (37.1 C) (04/18 0527) Pulse Rate:  [77-92] 92 (04/18 0527) Resp:  [18-20] 20 (04/18 0527) BP: (110-123)/(77-85) 123/78 (04/18 0527) SpO2:  [96 %-100 %] 96 % (04/18 0527)  Intake/Output from previous day: 04/17 0701 - 04/18 0700 In: 2805.3 [P.O.:600; I.V.:2205.3] Out: 4350 [Urine:4350] Intake/Output this shift: Total I/O In: -  Out: 350 [Urine:350]  Physical Exam:  General:alert, cooperative and appears stated age GI: soft, non tender, normal bowel sounds, no palpable masses, no organomegaly, no inguinal hernia Male genitalia: not done Extremities: extremities normal, atraumatic, no cyanosis or edema  Lab Results: Recent Labs    10/10/19 1525 10/11/19 0547  HGB 15.4 14.6  HCT 47.7 44.7   BMET Recent Labs    10/11/19 0547  NA 135  K 4.3  CL 99  CO2 30  GLUCOSE 162*  BUN 17  CREATININE 1.11  CALCIUM 8.4*   No results for input(s): LABPT, INR in the last 72 hours. No results for input(s): LABURIN in the last 72 hours. Results for orders placed or performed during the hospital encounter of 10/07/19  SARS CORONAVIRUS 2 (TAT 6-24 HRS) Nasopharyngeal Nasopharyngeal Swab     Status: None   Collection Time: 10/07/19  8:40 AM   Specimen: Nasopharyngeal Swab  Result Value Ref Range Status   SARS Coronavirus 2 NEGATIVE NEGATIVE Final    Comment: (NOTE) SARS-CoV-2 target nucleic acids are NOT DETECTED. The SARS-CoV-2 RNA is generally detectable in upper and lower respiratory specimens during the acute phase of infection. Negative results do not preclude SARS-CoV-2 infection, do not rule out co-infections with other pathogens, and should not be used as the sole basis for treatment or other patient management decisions. Negative  results must be combined with clinical observations, patient history, and epidemiological information. The expected result is Negative. Fact Sheet for Patients: SugarRoll.be Fact Sheet for Healthcare Providers: https://www.woods-mathews.com/ This test is not yet approved or cleared by the [place] FDA and  has been authorized for detection and/or diagnosis of SARS-CoV-2 by FDA under an Emergency Use Authorization (EUA). This EUA will remain  in effect (meaning this test can be used) for the duration of the COVID-19 declaration under Section 56 4(b)(1) of the Act, 21 U.S.C. section 360bbb-3(b)(1), unless the authorization is terminated or revoked sooner. Performed at [facility], [place] [address] [place], [place] [address]     Studies/Results: No results found.  Assessment/Plan: POD#2 right robotic partial nephrectomy  1. Advance diet toregular 2. ambulate with assistance 3. D/c this afternoon   LOS: 0 days   Nicolette Bang 10/12/2019, 11:14 AM

## 2019-10-13 ENCOUNTER — Encounter: Payer: Self-pay | Admitting: *Deleted

## 2019-10-13 LAB — SURGICAL PATHOLOGY

## 2019-10-13 NOTE — Anesthesia Postprocedure Evaluation (Signed)
Anesthesia Post Note  Patient: Public librarian  Procedure(s) Performed: XI ROBOTIC ASSITED PARTIAL NEPHRECTOMY (Right )     Patient location during evaluation: PACU Anesthesia Type: General Level of consciousness: awake and alert Pain management: pain level controlled Vital Signs Assessment: post-procedure vital signs reviewed and stable Respiratory status: spontaneous breathing, nonlabored ventilation, respiratory function stable and patient connected to nasal cannula oxygen Cardiovascular status: blood pressure returned to baseline and stable Postop Assessment: no apparent nausea or vomiting Anesthetic complications: no    Last Vitals:  Vitals:   10/12/19 0527 10/12/19 1315  BP: 123/78 124/80  Pulse: 92 90  Resp: 20 18  Temp: 37.1 C 36.7 C  SpO2: 96% 97%    Last Pain:  Vitals:   10/12/19 1830  TempSrc:   PainSc: 5                  Aqueelah Cotrell L Floella Ensz

## 2019-10-13 NOTE — Discharge Summary (Signed)
Physician Discharge Summary  Patient ID: Thomas Hancock MRN: [id number] DOB/AGE: [birth year] 64 y.o.  Admit date: 10/10/2019 Discharge date: 10/12/2019  Admission Diagnoses:  Renal mass  Discharge Diagnoses:  Active Problems:   Renal mass   Past Medical History:  Diagnosis Date  . Anxiety   . Complication of anesthesia    after anesthesia muscle pain  . Depression   . GERD (gastroesophageal reflux disease)   . Polycythemia vera ([place])   . Right renal mass   . Vitamin B12 deficiency     Surgeries: Procedure(s): XI ROBOTIC ASSITED PARTIAL NEPHRECTOMY on 10/10/2019   Consultants (if any): Treatment Team:  Ceasar Mons, MD  Discharged Condition: Improved  Hospital Course: Thomas Hancock is an 64 y.o. male who was admitted 10/10/2019 with a diagnosis of renal mass and went to the operating room on 10/10/2019 and underwent the above named procedures.    He was given perioperative antibiotics:  Anti-infectives (From admission, onward)   Start     Dose/Rate Route Frequency Ordered Stop   10/10/19 2100  ceFAZolin (ANCEF) IVPB 1 g/50 mL premix     1 g 100 mL/hr over 30 Minutes Intravenous Every 8 hours 10/10/19 1708 10/11/19 0555   10/10/19 1045  ceFAZolin (ANCEF) IVPB 2g/100 mL premix     2 g 200 mL/hr over 30 Minutes Intravenous  Once 10/10/19 1042 10/10/19 1316    .  He was given sequential compression devices, early ambulation for DVT prophylaxis.  He benefited maximally from the hospital stay and there were no complications.    Recent vital signs:  Vitals:   10/12/19 0527 10/12/19 1315  BP: 123/78 124/80  Pulse: 92 90  Resp: 20 18  Temp: 98.7 F (37.1 C) 98 F (36.7 C)  SpO2: 96% 97%    Recent laboratory studies:  Lab Results  Component Value Date   HGB 14.6 10/11/2019   HGB 15.4 10/10/2019   HGB 16.2 10/03/2019   Lab Results  Component Value Date   WBC 4.2 10/03/2019   PLT 254 10/03/2019   Lab Results  Component Value Date   INR 1.0  10/03/2019   Lab Results  Component Value Date   NA 135 10/11/2019   K 4.3 10/11/2019   CL 99 10/11/2019   CO2 30 10/11/2019   BUN 17 10/11/2019   CREATININE 1.11 10/11/2019   GLUCOSE 162 (H) 10/11/2019    Discharge Medications:   Allergies as of 10/12/2019   Not on File     Medication List    STOP taking these medications   aspirin EC 81 MG tablet   B-12 Compliance Injection 1000 MCG/ML Kit Generic drug: Cyanocobalamin     TAKE these medications   acetaminophen 325 MG tablet Commonly known as: TYLENOL Take 650 mg by mouth every 6 (six) hours as needed for moderate pain or headache.   docusate sodium 100 MG capsule Commonly known as: COLACE Take 1 capsule (100 mg total) by mouth 2 (two) times daily.   HYDROcodone-acetaminophen 5-325 MG tablet Commonly known as: Norco Take 1-2 tablets by mouth every 6 (six) hours as needed for moderate pain.   hydroxyurea 500 MG capsule Commonly known as: HYDREA Take 1 capsule (500 mg total) by mouth 2 (two) times daily. May take with food to minimize GI side effects.   pantoprazole 40 MG tablet Commonly known as: PROTONIX Take 1 tablet (40 mg total) by mouth daily.   promethazine 12.5 MG tablet Commonly known as: PHENERGAN Take 1 tablet (  12.5 mg total) by mouth every 4 (four) hours as needed for nausea or vomiting.   sertraline 50 MG tablet Commonly known as: ZOLOFT Take 1 tablet (50 mg total) by mouth daily. Take 1/2 tab daily for first 2 weeks. What changed: additional instructions   traZODone 50 MG tablet Commonly known as: DESYREL Take 0.5-1 tablets (25-50 mg total) by mouth at bedtime as needed for sleep.       Diagnostic Studies: DG Chest 2 View  Result Date: 09/30/2019 CLINICAL DATA:  History of right renal neoplasm EXAM: CHEST - 2 VIEW COMPARISON:  None. FINDINGS: Lungs are clear. Heart size and pulmonary vascularity are normal. No adenopathy. No blastic or lytic bone lesions. IMPRESSION: No neoplastic focus  evident.  Lungs clear.  No evident adenopathy. Electronically Signed   By: Lowella Grip III M.D.   On: 09/30/2019 11:02    Disposition: Discharge disposition: 01-Home or Self Care         Follow-up Information    Ceasar Mons, MD On 10/24/2019.   Specialty: Urology Why: at 8:45 Contact information: [address] [place] [address] [phone]            Signed: Nicolette Bang 10/13/2019, 11:35 AM

## 2019-10-16 ENCOUNTER — Inpatient Hospital Stay (HOSPITAL_BASED_OUTPATIENT_CLINIC_OR_DEPARTMENT_OTHER): Payer: 59 | Admitting: Hematology & Oncology

## 2019-10-16 ENCOUNTER — Inpatient Hospital Stay: Payer: 59 | Attending: Family

## 2019-10-16 ENCOUNTER — Other Ambulatory Visit: Payer: Self-pay

## 2019-10-16 ENCOUNTER — Telehealth: Payer: Self-pay | Admitting: Hematology & Oncology

## 2019-10-16 ENCOUNTER — Ambulatory Visit: Payer: 59 | Admitting: Hematology & Oncology

## 2019-10-16 ENCOUNTER — Other Ambulatory Visit: Payer: 59

## 2019-10-16 ENCOUNTER — Encounter: Payer: Self-pay | Admitting: Hematology & Oncology

## 2019-10-16 VITALS — BP 129/80 | HR 93 | Temp 96.2°F | Resp 18 | Wt 177.0 lb

## 2019-10-16 DIAGNOSIS — D75839 Thrombocytosis, unspecified: Secondary | ICD-10-CM

## 2019-10-16 DIAGNOSIS — C641 Malignant neoplasm of right kidney, except renal pelvis: Secondary | ICD-10-CM

## 2019-10-16 DIAGNOSIS — Z85528 Personal history of other malignant neoplasm of kidney: Secondary | ICD-10-CM | POA: Insufficient documentation

## 2019-10-16 DIAGNOSIS — Z79899 Other long term (current) drug therapy: Secondary | ICD-10-CM | POA: Insufficient documentation

## 2019-10-16 DIAGNOSIS — D45 Polycythemia vera: Secondary | ICD-10-CM | POA: Diagnosis not present

## 2019-10-16 DIAGNOSIS — D473 Essential (hemorrhagic) thrombocythemia: Secondary | ICD-10-CM | POA: Diagnosis not present

## 2019-10-16 DIAGNOSIS — Z905 Acquired absence of kidney: Secondary | ICD-10-CM | POA: Diagnosis not present

## 2019-10-16 DIAGNOSIS — Z1589 Genetic susceptibility to other disease: Secondary | ICD-10-CM

## 2019-10-16 HISTORY — DX: Hemochromatosis, unspecified: E83.119

## 2019-10-16 HISTORY — DX: Polycythemia vera: D45

## 2019-10-16 HISTORY — DX: Malignant neoplasm of right kidney, except renal pelvis: C64.1

## 2019-10-16 LAB — CMP (CANCER CENTER ONLY)
ALT: 104 U/L — ABNORMAL HIGH (ref 0–44)
AST: 58 U/L — ABNORMAL HIGH (ref 15–41)
Albumin: 4.1 g/dL (ref 3.5–5.0)
Alkaline Phosphatase: 131 U/L — ABNORMAL HIGH (ref 38–126)
Anion gap: 11 (ref 5–15)
BUN: 18 mg/dL (ref 8–23)
CO2: 26 mmol/L (ref 22–32)
Calcium: 9.8 mg/dL (ref 8.9–10.3)
Chloride: 98 mmol/L (ref 98–111)
Creatinine: 1.08 mg/dL (ref 0.61–1.24)
GFR, Est AFR Am: >60 mL/min (ref >60)
GFR, Estimated: >60 mL/min (ref >60)
Glucose, Bld: 201 mg/dL — ABNORMAL HIGH (ref 70–99)
Potassium: 3.9 mmol/L (ref 3.5–5.1)
Sodium: 135 mmol/L (ref 135–145)
Total Bilirubin: 1.1 mg/dL (ref 0.3–1.2)
Total Protein: 8 g/dL (ref 6.5–8.1)

## 2019-10-16 LAB — CBC WITH DIFFERENTIAL (CANCER CENTER ONLY)
Abs Immature Granulocytes: 0.03 10*3/uL (ref 0.00–0.07)
Basophils Absolute: 0.1 10*3/uL (ref 0.0–0.1)
Basophils Relative: 1 %
Eosinophils Absolute: 0.1 10*3/uL (ref 0.0–0.5)
Eosinophils Relative: 2 %
HCT: 45.6 % (ref 39.0–52.0)
Hemoglobin: 15.1 g/dL (ref 13.0–17.0)
Immature Granulocytes: 0 %
Lymphocytes Relative: 17 %
Lymphs Abs: 1.2 10*3/uL (ref 0.7–4.0)
MCH: 28.3 pg (ref 26.0–34.0)
MCHC: 33.1 g/dL (ref 30.0–36.0)
MCV: 85.6 fL (ref 80.0–100.0)
Monocytes Absolute: 0.6 10*3/uL (ref 0.1–1.0)
Monocytes Relative: 9 %
Neutro Abs: 4.8 10*3/uL (ref 1.7–7.7)
Neutrophils Relative %: 71 %
Platelet Count: 243 10*3/uL (ref 150–400)
RBC: 5.33 MIL/uL (ref 4.22–5.81)
RDW: 24.5 % — ABNORMAL HIGH (ref 11.5–15.5)
WBC Count: 6.8 10*3/uL (ref 4.0–10.5)
nRBC: 0 % (ref 0.0–0.2)

## 2019-10-16 LAB — IRON AND TIBC
Iron: 51 ug/dL (ref 42–163)
Saturation Ratios: 19 % — ABNORMAL LOW (ref 20–55)
TIBC: 269 ug/dL (ref 202–409)
UIBC: 218 ug/dL (ref 117–376)

## 2019-10-16 LAB — LACTATE DEHYDROGENASE: LDH: 246 U/L — ABNORMAL HIGH (ref 98–192)

## 2019-10-16 LAB — FERRITIN: Ferritin: 346 ng/mL — ABNORMAL HIGH (ref 24–336)

## 2019-10-16 NOTE — Telephone Encounter (Signed)
Appointments scheduled calendar printed per 4/22 los 

## 2019-10-16 NOTE — Progress Notes (Signed)
Hematology and Oncology Follow Up Visit  Arnett Habibi [id number] 1955/08/17 64 y.o. 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 Schorer is here today with his wife for follow-up.  He really looks quite good.  Of saying this because he actually had surgery a week ago to remove the right kidney.  He has had a partial nephrectomy of the right kidney.  This was for a mass that we saw him that was incidental on a recent scan while we are working up his polycythemia.  The pathology report ([id number]) shows a renal cell carcinoma that is chromophobe histology.  It is 2.2 cm in size.  All margins are negative.  There is no lymph nodes that were sampled.  As such, he is a stage I renal cell carcinoma.  I think that he is cured of this.  I do not think he needs any adjuvant therapy for the renal cell carcinoma.  He has done incredibly well with the polycythemia.  His platelet count is come down quite nicely.  His platelet count today is 243,000.  We are going to decrease his Hydrea dose to 500 mg p.o. daily.  He is having no problems with fever.  He has had a couple sweats.  He has had no cough.  He has had no nausea or vomiting.  There is been no leg swelling.  He has had no rashes.  He has had no pain in his hands or feet.  Overall, I would say his performance status is ECOG 1.   Medications:  Allergies as of 10/16/2019   Not on File     Medication List       Accurate as of October 16, 2019  8:19 AM. If you have any questions, ask your nurse or doctor.        acetaminophen 325 MG tablet Commonly known as: TYLENOL Take 650 mg by mouth every 6 (six) hours as  needed for moderate pain or headache.   docusate sodium 100 MG capsule Commonly known as: COLACE Take 1 capsule (100 mg total) by mouth 2 (two) times daily.   HYDROcodone-acetaminophen 5-325 MG tablet Commonly known as: Norco Take 1-2 tablets by mouth every 6 (six) hours as needed for moderate pain.   hydroxyurea 500 MG capsule Commonly known as: HYDREA Take 1 capsule (500 mg total) by mouth 2 (two) times daily. May take with food to minimize GI side effects.   pantoprazole 40 MG tablet Commonly known as: PROTONIX Take 1 tablet (40 mg total) by mouth daily.   promethazine 12.5 MG tablet Commonly known as: PHENERGAN Take 1 tablet (12.5 mg total) by mouth every 4 (four) hours as needed for nausea or vomiting.   sertraline 50 MG tablet Commonly known as: ZOLOFT Take 1 tablet (50 mg total) by mouth daily. Take 1/2 tab daily for first 2 weeks. What changed: additional instructions   traZODone 50 MG tablet Commonly known as: DESYREL Take 0.5-1 tablets (25-50 mg total) by mouth at bedtime as needed for sleep.       Allergies: Not on File  Past Medical History, Surgical history, Social history, and Family History were reviewed and updated.  Review of Systems: Review of Systems  Constitutional: Negative.   HENT: Negative.   Eyes: Negative.   Respiratory: Negative.   Cardiovascular: Negative.   Gastrointestinal: Positive for abdominal pain.  Genitourinary: Negative.   Musculoskeletal: Negative.   Skin: Negative.   Neurological: Negative.   Endo/Heme/Allergies: Negative.   Psychiatric/Behavioral: Negative.      Physical Exam:  weight is 177 lb (80.3 kg). His temporal temperature is 96.2 F (35.7 C) (abnormal). His blood pressure is 129/80 and his pulse is 93. His respiration is 18 and oxygen saturation is 100%.   Wt Readings from Last 3 Encounters:  10/16/19 177 lb (80.3 kg)  10/10/19 184 lb 11.9 oz (83.8 kg)  10/03/19 185 lb (83.9 kg)    Physical Exam Vitals  reviewed.  HENT:     Head: Normocephalic and atraumatic.  Eyes:     Pupils: Pupils are equal, round, and reactive to light.  Cardiovascular:     Rate and Rhythm: Normal rate and regular rhythm.     Heart sounds: Normal heart sounds.  Pulmonary:     Effort: Pulmonary effort is normal.     Breath sounds: Normal breath sounds.  Abdominal:     General: Bowel sounds are normal.     Palpations: Abdomen is soft.     Comments: Abdominal exam shows healing laparoscopy scars.  I think he has 5 scars.  They are well-healed.  There is no erythema.  There is no exudate.  His abdomen is slightly distended.  He has slightly decreased bowel sounds.  There is no guarding.  He has no palpable abdominal mass.  Is no palpable liver or spleen tip.  Musculoskeletal:        General: No tenderness or deformity. Normal range of motion.     Cervical back: Normal range of motion.  Lymphadenopathy:     Cervical: No cervical adenopathy.  Skin:    General: Skin is warm and dry.     Findings: No erythema or rash.  Neurological:     Mental Status: He is alert and oriented to person, place, and time.  Psychiatric:        Behavior: Behavior normal.        Thought Content: Thought content normal.        Judgment: Judgment normal.      Lab Results  Component Value Date   WBC 6.8 10/16/2019   HGB 15.1 10/16/2019   HCT 45.6 10/16/2019   MCV 85.6 10/16/2019   PLT 243 10/16/2019   Lab Results  Component Value Date   FERRITIN 44 09/04/2019   IRON 108 09/04/2019   TIBC 349 09/04/2019   UIBC 241 09/04/2019   IRONPCTSAT 31 09/04/2019   Lab Results  Component Value Date   RETICCTPCT 1.0 08/07/2019   RBC 5.33 10/16/2019   No results found for: KPAFRELGTCHN, LAMBDASER, KAPLAMBRATIO No results found for: IGGSERUM, IGA, IGMSERUM No results found for: Odetta Pink, SPEI   Chemistry      Component Value Date/Time   NA 135 10/11/2019 0547   K 4.3  10/11/2019 0547   CL 99 10/11/2019 0547   CO2 30 10/11/2019 0547   BUN 17 10/11/2019 0547   CREATININE 1.11 10/11/2019 0547   CREATININE 1.07 09/04/2019 0824      Component Value Date/Time   CALCIUM 8.4 (L) 10/11/2019 0547   ALKPHOS 77 09/04/2019 0824   AST 20 09/04/2019 0824   ALT 17 09/04/2019 0824  BILITOT 0.9 09/04/2019 0824       Impression and Plan: Mr. Piccini is a very pleasant 64 yo [name] with Polycythemia vera -  JAK 2 positive; 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  The renal cell carcinoma has been treated with surgery.  Thankfully he did not require complete nephrectomy.  Again I do not think that the renal cell carcinoma will come back.  The chance of this recurrent should be less than 10%.  His hemoglobin is doing okay.  I do not believe he needs to be phlebotomized right now.  The Hydrea is clearly help and his platelets.  The Hydrea is going to be dosed downward now.  We will give 500 mg a day.  I would like to see him back in 5 weeks or so.  I would like to see him walk in here.  I am sure he will be able to walk in here as he heals up from his surgery.  Urology, as always, has done a fantastic job with him.  I very much appreciate their assistance with the renal cell carcinoma.   Volanda Napoleon, MD 4/22/20218:19 AM

## 2019-10-23 ENCOUNTER — Ambulatory Visit: Payer: 59

## 2019-10-30 ENCOUNTER — Other Ambulatory Visit: Payer: 59

## 2019-11-11 ENCOUNTER — Other Ambulatory Visit: Payer: Self-pay

## 2019-11-11 ENCOUNTER — Other Ambulatory Visit: Payer: Self-pay | Admitting: Family Medicine

## 2019-11-11 ENCOUNTER — Other Ambulatory Visit (INDEPENDENT_AMBULATORY_CARE_PROVIDER_SITE_OTHER): Payer: 59

## 2019-11-11 ENCOUNTER — Ambulatory Visit (INDEPENDENT_AMBULATORY_CARE_PROVIDER_SITE_OTHER): Payer: 59

## 2019-11-11 DIAGNOSIS — E538 Deficiency of other specified B group vitamins: Secondary | ICD-10-CM

## 2019-11-11 LAB — VITAMIN B12: Vitamin B-12: >1500 pg/mL — ABNORMAL HIGH (ref 211–911)

## 2019-11-11 MED ORDER — CYANOCOBALAMIN 1000 MCG/ML IJ SOLN
1000.0000 ug | Freq: Once | INTRAMUSCULAR | Status: AC
  Administered 2019-11-11: 1000 ug via INTRAMUSCULAR

## 2019-11-11 NOTE — Progress Notes (Signed)
Pt here for monthly B12 injection per   B12 1075mcg given IM, and pt tolerated injection well.  Next B12 injection scheduled for.  Will wait for lab results before scheduling next B 12 injection

## 2019-11-12 ENCOUNTER — Other Ambulatory Visit: Payer: Self-pay

## 2019-11-12 DIAGNOSIS — E538 Deficiency of other specified B group vitamins: Secondary | ICD-10-CM

## 2019-11-18 ENCOUNTER — Inpatient Hospital Stay: Payer: 59 | Attending: Family

## 2019-11-18 ENCOUNTER — Inpatient Hospital Stay: Payer: 59 | Admitting: Hematology & Oncology

## 2019-11-21 ENCOUNTER — Other Ambulatory Visit: Payer: Self-pay | Admitting: Family Medicine

## 2019-11-21 DIAGNOSIS — F320 Major depressive disorder, single episode, mild: Secondary | ICD-10-CM

## 2019-11-24 ENCOUNTER — Encounter: Payer: Self-pay | Admitting: Hematology & Oncology

## 2019-11-25 ENCOUNTER — Inpatient Hospital Stay (HOSPITAL_BASED_OUTPATIENT_CLINIC_OR_DEPARTMENT_OTHER): Payer: 59 | Admitting: Hematology & Oncology

## 2019-11-25 ENCOUNTER — Inpatient Hospital Stay: Payer: 59 | Attending: Family

## 2019-11-25 ENCOUNTER — Other Ambulatory Visit: Payer: Self-pay | Admitting: Family Medicine

## 2019-11-25 ENCOUNTER — Other Ambulatory Visit: Payer: Self-pay

## 2019-11-25 VITALS — BP 136/94 | HR 71 | Temp 97.7°F | Resp 18 | Wt 186.0 lb

## 2019-11-25 DIAGNOSIS — C641 Malignant neoplasm of right kidney, except renal pelvis: Secondary | ICD-10-CM

## 2019-11-25 DIAGNOSIS — D45 Polycythemia vera: Secondary | ICD-10-CM | POA: Insufficient documentation

## 2019-11-25 DIAGNOSIS — Z905 Acquired absence of kidney: Secondary | ICD-10-CM | POA: Diagnosis not present

## 2019-11-25 DIAGNOSIS — Z85528 Personal history of other malignant neoplasm of kidney: Secondary | ICD-10-CM | POA: Insufficient documentation

## 2019-11-25 DIAGNOSIS — Z79899 Other long term (current) drug therapy: Secondary | ICD-10-CM | POA: Insufficient documentation

## 2019-11-25 DIAGNOSIS — F320 Major depressive disorder, single episode, mild: Secondary | ICD-10-CM

## 2019-11-25 DIAGNOSIS — D75839 Thrombocytosis, unspecified: Secondary | ICD-10-CM

## 2019-11-25 LAB — CBC WITH DIFFERENTIAL (CANCER CENTER ONLY)
Abs Immature Granulocytes: 0.04 10*3/uL (ref 0.00–0.07)
Basophils Absolute: 0.1 10*3/uL (ref 0.0–0.1)
Basophils Relative: 1 %
Eosinophils Absolute: 0.2 10*3/uL (ref 0.0–0.5)
Eosinophils Relative: 3 %
HCT: 41.3 % (ref 39.0–52.0)
Hemoglobin: 14 g/dL (ref 13.0–17.0)
Immature Granulocytes: 1 %
Lymphocytes Relative: 23 %
Lymphs Abs: 1.5 10*3/uL (ref 0.7–4.0)
MCH: 32.2 pg (ref 26.0–34.0)
MCHC: 33.9 g/dL (ref 30.0–36.0)
MCV: 94.9 fL (ref 80.0–100.0)
Monocytes Absolute: 0.5 10*3/uL (ref 0.1–1.0)
Monocytes Relative: 8 %
Neutro Abs: 4.4 10*3/uL (ref 1.7–7.7)
Neutrophils Relative %: 64 %
Platelet Count: 346 10*3/uL (ref 150–400)
RBC: 4.35 MIL/uL (ref 4.22–5.81)
RDW: 22.2 % — ABNORMAL HIGH (ref 11.5–15.5)
WBC Count: 6.7 10*3/uL (ref 4.0–10.5)
nRBC: 0 % (ref 0.0–0.2)

## 2019-11-25 LAB — CMP (CANCER CENTER ONLY)
ALT: 18 U/L (ref 0–44)
AST: 20 U/L (ref 15–41)
Albumin: 4.5 g/dL (ref 3.5–5.0)
Alkaline Phosphatase: 79 U/L (ref 38–126)
Anion gap: 9 (ref 5–15)
BUN: 21 mg/dL (ref 8–23)
CO2: 27 mmol/L (ref 22–32)
Calcium: 9.6 mg/dL (ref 8.9–10.3)
Chloride: 104 mmol/L (ref 98–111)
Creatinine: 1.05 mg/dL (ref 0.61–1.24)
GFR, Est AFR Am: >60 mL/min (ref >60)
GFR, Estimated: >60 mL/min (ref >60)
Glucose, Bld: 145 mg/dL — ABNORMAL HIGH (ref 70–99)
Potassium: 3.8 mmol/L (ref 3.5–5.1)
Sodium: 140 mmol/L (ref 135–145)
Total Bilirubin: 0.6 mg/dL (ref 0.3–1.2)
Total Protein: 7.7 g/dL (ref 6.5–8.1)

## 2019-11-25 LAB — VITAMIN B12: Vitamin B-12: 370 pg/mL (ref 180–914)

## 2019-11-25 MED ORDER — SERTRALINE HCL 50 MG PO TABS: 50.0000 mg | ORAL_TABLET | Freq: Every day | ORAL | 0 refills | Status: DC

## 2019-11-25 NOTE — Progress Notes (Signed)
Hematology and Oncology Follow Up Visit  Thomas Hancock [id number] Dec 22, 1955 64 y.o. 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  He is doing quite well.  He and his wife are now grandparents.  There is son had a grandson about 10 days ago.  This is all very exciting.  He is doing nicely.  He has occasional abdominal discomfort.  He has had no weight gain.  He says he is eating but not gaining weight.  I told him that this should not be much of an issue.  He is doing well on the Hydrea.  He is now on 500 mg a day.  His platelet count went up a little bit but still not enough that we really need to make a change with the Hydrea dose.  He has had no fever.  He has had no bleeding.  He has had no nausea or vomiting.  He has had no rashes.  He says on occasion he does get a little bit of a "fog" on occasion.  He has had this happen before surgery.  I thought maybe it is having after surgery that this would just be a lingering effect from anesthesia.  I cannot imagine that this would be anything from his Hydrea.  As far as the hemochromatosis is concerned, back in April, his ferritin was 346 with an iron saturation of 19%.  There is been no change in bowel or bladder habits.  Overall, I would say his performance status is ECOG 0.    Medications:  Allergies as of 11/25/2019   Not on File     Medication List       Accurate as of November 25, 2019  3:11 PM. If you have any questions, ask your nurse or doctor.        acetaminophen 325 MG tablet Commonly known as: TYLENOL Take 650 mg by mouth every 6 (six) hours as  needed for moderate pain or headache.   docusate sodium 100 MG capsule Commonly known as: COLACE Take 1 capsule (100 mg total) by mouth 2 (two) times daily.   HYDROcodone-acetaminophen 5-325 MG tablet Commonly known as: Norco Take 1-2 tablets by mouth every 6 (six) hours as needed for moderate pain.   hydroxyurea 500 MG capsule Commonly known as: HYDREA Take 1 capsule (500 mg total) by mouth 2 (two) times daily. May take with food to minimize GI side effects.   pantoprazole 40 MG tablet Commonly known as: PROTONIX Take 1 tablet (40 mg total) by mouth daily.   promethazine 12.5 MG tablet Commonly known as: PHENERGAN Take 1 tablet (12.5 mg total) by mouth every 4 (four) hours as needed for nausea or vomiting.   sertraline 50 MG tablet Commonly known as: ZOLOFT TAKE 1 TABLET BY MOUTH ONCE DAILY. TAKE 1/2 TABLET DAILY FOR THE FIRST 2 WEEKS.       Allergies: Not on File  Past Medical History, Surgical history, Social history, and Family History were reviewed and updated.  Review of Systems: Review of Systems  Constitutional: Negative.   HENT: Negative.   Eyes: Negative.   Respiratory:  Negative.   Cardiovascular: Negative.   Gastrointestinal: Positive for abdominal pain.  Genitourinary: Negative.   Musculoskeletal: Negative.   Skin: Negative.   Neurological: Negative.   Endo/Heme/Allergies: Negative.   Psychiatric/Behavioral: Negative.      Physical Exam:  vitals were not taken for this visit.   Wt Readings from Last 3 Encounters:  10/16/19 177 lb (80.3 kg)  10/10/19 184 lb 11.9 oz (83.8 kg)  10/03/19 185 lb (83.9 kg)    Physical Exam Vitals reviewed.  HENT:     Head: Normocephalic and atraumatic.  Eyes:     Pupils: Pupils are equal, round, and reactive to light.  Cardiovascular:     Rate and Rhythm: Normal rate and regular rhythm.     Heart sounds: Normal heart sounds.  Pulmonary:     Effort: Pulmonary effort is normal.     Breath sounds: Normal breath  sounds.  Abdominal:     General: Bowel sounds are normal.     Palpations: Abdomen is soft.     Comments: Abdominal exam shows healing laparoscopy scars.  I think he has 5 scars.  They are well-healed.  There is no erythema.  There is no exudate.  His abdomen is slightly distended.  He has slightly decreased bowel sounds.  There is no guarding.  He has no palpable abdominal mass.  Is no palpable liver or spleen tip.  Musculoskeletal:        General: No tenderness or deformity. Normal range of motion.     Cervical back: Normal range of motion.  Lymphadenopathy:     Cervical: No cervical adenopathy.  Skin:    General: Skin is warm and dry.     Findings: No erythema or rash.  Neurological:     Mental Status: He is alert and oriented to person, place, and time.  Psychiatric:        Behavior: Behavior normal.        Thought Content: Thought content normal.        Judgment: Judgment normal.      Lab Results  Component Value Date   WBC 6.7 11/25/2019   HGB 14.0 11/25/2019   HCT 41.3 11/25/2019   MCV 94.9 11/25/2019   PLT 346 11/25/2019   Lab Results  Component Value Date   FERRITIN 346 (H) 10/16/2019   IRON 51 10/16/2019   TIBC 269 10/16/2019   UIBC 218 10/16/2019   IRONPCTSAT 19 (L) 10/16/2019   Lab Results  Component Value Date   RETICCTPCT 1.0 08/07/2019   RBC 4.35 11/25/2019   No results found for: KPAFRELGTCHN, LAMBDASER, KAPLAMBRATIO No results found for: IGGSERUM, IGA, IGMSERUM No results found for: Odetta Pink, SPEI   Chemistry      Component Value Date/Time   NA 135 10/16/2019 0752   K 3.9 10/16/2019 0752   CL 98 10/16/2019 0752   CO2 26 10/16/2019 0752   BUN 18 10/16/2019 0752   CREATININE 1.08 10/16/2019 0752      Component Value Date/Time   CALCIUM 9.8 10/16/2019 0752   ALKPHOS 131 (H) 10/16/2019 0752   AST 58 (H) 10/16/2019 0752   ALT 104 (H) 10/16/2019 0752   BILITOT 1.1 10/16/2019 0752        Impression and Plan: Thomas Hancock is a very pleasant 64 yo Thomas Hancock with Polycythemia vera -  JAK 2 positive; 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  The renal cell carcinoma has  been treated with surgery.  Thankfully he did not require complete nephrectomy.  Again I do not think that the renal cell carcinoma will come back.  The chance of this recurrent should be less than 10%.  His hemoglobin is doing okay.  I do not believe he needs to be phlebotomized right now.  I think that we can now get him back in 2 months.  If the platelet count is little bit higher, then we might have to increase his Hydrea dose back to 1000 mg daily.    Volanda Napoleon, MD 6/1/20213:11 PM

## 2019-11-26 LAB — IRON AND TIBC
Iron: 86 ug/dL (ref 45–182)
Saturation Ratios: 26 % (ref 17.9–39.5)
TIBC: 332 ug/dL (ref 250–450)
UIBC: 246 ug/dL

## 2019-11-26 LAB — FERRITIN: Ferritin: 180 ng/mL (ref 24–336)

## 2019-11-26 LAB — LACTATE DEHYDROGENASE: LDH: 191 U/L (ref 98–192)

## 2019-12-18 ENCOUNTER — Other Ambulatory Visit: Payer: Self-pay

## 2019-12-18 DIAGNOSIS — F320 Major depressive disorder, single episode, mild: Secondary | ICD-10-CM

## 2019-12-19 ENCOUNTER — Other Ambulatory Visit: Payer: Self-pay | Admitting: Family Medicine

## 2019-12-19 DIAGNOSIS — F320 Major depressive disorder, single episode, mild: Secondary | ICD-10-CM

## 2019-12-25 ENCOUNTER — Other Ambulatory Visit (INDEPENDENT_AMBULATORY_CARE_PROVIDER_SITE_OTHER): Payer: 59

## 2019-12-25 ENCOUNTER — Other Ambulatory Visit: Payer: Self-pay

## 2019-12-25 DIAGNOSIS — E538 Deficiency of other specified B group vitamins: Secondary | ICD-10-CM

## 2019-12-25 LAB — VITAMIN B12: Vitamin B-12: 308 pg/mL (ref 211–911)

## 2020-02-05 ENCOUNTER — Inpatient Hospital Stay: Payer: 59 | Attending: Family | Admitting: Hematology & Oncology

## 2020-02-05 ENCOUNTER — Telehealth: Payer: Self-pay | Admitting: Hematology & Oncology

## 2020-02-05 ENCOUNTER — Inpatient Hospital Stay: Payer: 59

## 2020-02-05 ENCOUNTER — Other Ambulatory Visit: Payer: Self-pay

## 2020-02-05 ENCOUNTER — Encounter: Payer: Self-pay | Admitting: Hematology & Oncology

## 2020-02-05 VITALS — BP 104/74 | HR 78 | Temp 98.8°F | Resp 17 | Wt 183.8 lb

## 2020-02-05 DIAGNOSIS — Z85528 Personal history of other malignant neoplasm of kidney: Secondary | ICD-10-CM | POA: Diagnosis not present

## 2020-02-05 DIAGNOSIS — C641 Malignant neoplasm of right kidney, except renal pelvis: Secondary | ICD-10-CM

## 2020-02-05 DIAGNOSIS — D45 Polycythemia vera: Secondary | ICD-10-CM

## 2020-02-05 LAB — CMP (CANCER CENTER ONLY)
ALT: 17 U/L (ref 0–44)
AST: 20 U/L (ref 15–41)
Albumin: 4.7 g/dL (ref 3.5–5.0)
Alkaline Phosphatase: 77 U/L (ref 38–126)
Anion gap: 9 (ref 5–15)
BUN: 21 mg/dL (ref 8–23)
CO2: 28 mmol/L (ref 22–32)
Calcium: 10 mg/dL (ref 8.9–10.3)
Chloride: 104 mmol/L (ref 98–111)
Creatinine: 1.14 mg/dL (ref 0.61–1.24)
GFR, Est AFR Am: >60 mL/min (ref >60)
GFR, Estimated: >60 mL/min (ref >60)
Glucose, Bld: 137 mg/dL — ABNORMAL HIGH (ref 70–99)
Potassium: 4 mmol/L (ref 3.5–5.1)
Sodium: 141 mmol/L (ref 135–145)
Total Bilirubin: 0.6 mg/dL (ref 0.3–1.2)
Total Protein: 7.7 g/dL (ref 6.5–8.1)

## 2020-02-05 LAB — CBC WITH DIFFERENTIAL (CANCER CENTER ONLY)
Abs Immature Granulocytes: 0.02 10*3/uL (ref 0.00–0.07)
Basophils Absolute: 0.1 10*3/uL (ref 0.0–0.1)
Basophils Relative: 1 %
Eosinophils Absolute: 0.1 10*3/uL (ref 0.0–0.5)
Eosinophils Relative: 2 %
HCT: 46.7 % (ref 39.0–52.0)
Hemoglobin: 16.3 g/dL (ref 13.0–17.0)
Immature Granulocytes: 0 %
Lymphocytes Relative: 21 %
Lymphs Abs: 1.4 10*3/uL (ref 0.7–4.0)
MCH: 34.8 pg — ABNORMAL HIGH (ref 26.0–34.0)
MCHC: 34.9 g/dL (ref 30.0–36.0)
MCV: 99.6 fL (ref 80.0–100.0)
Monocytes Absolute: 0.6 10*3/uL (ref 0.1–1.0)
Monocytes Relative: 9 %
Neutro Abs: 4.5 10*3/uL (ref 1.7–7.7)
Neutrophils Relative %: 67 %
Platelet Count: 341 10*3/uL (ref 150–400)
RBC: 4.69 MIL/uL (ref 4.22–5.81)
RDW: 11.5 % (ref 11.5–15.5)
WBC Count: 6.6 10*3/uL (ref 4.0–10.5)
nRBC: 0 % (ref 0.0–0.2)

## 2020-02-05 LAB — LACTATE DEHYDROGENASE: LDH: 154 U/L (ref 98–192)

## 2020-02-05 LAB — FERRITIN: Ferritin: 64 ng/mL (ref 24–336)

## 2020-02-05 LAB — SAVE SMEAR(SSMR), FOR PROVIDER SLIDE REVIEW

## 2020-02-05 LAB — IRON AND TIBC
Iron: 73 ug/dL (ref 42–163)
Saturation Ratios: 22 % (ref 20–55)
TIBC: 338 ug/dL (ref 202–409)
UIBC: 265 ug/dL (ref 117–376)

## 2020-02-05 NOTE — Telephone Encounter (Signed)
Appointments scheduled , declined calendar patient has My Chart Access per 8/12 los

## 2020-02-05 NOTE — Progress Notes (Addendum)
Hematology and Oncology Follow Up Visit  Thomas Hancock [id number] [birth year] 64 y.o. 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  He is doing quite well.  He and his wife are now grandparents.  There is son had a grandson about 10 days ago.  This is all very exciting.  He is doing nicely.  He has occasional abdominal discomfort.  He has had no weight gain.  He is wife are planning on going down to [place] this weekend for a few days.  It sounds like they will meet their son, his family and grandson down there.  We last saw him back in June, his ferritin was 180 with an iron saturation of 26%.  He is done well on the Hydrea.  This is controlled his blood counts quite nicely.  He has had no problems with headache.  He has had no change in bowel or bladder habits.  He has had no issues with fever.  He has had no rashes.  He has had no cough.  Overall, I was his performance status is ECOG 1.  He sees a urologist in a week or so.  I think he is going to have a CT scan done at that time.  His weight is holding stable.  He is a little worried about this.  I told him not to worry unless his weight starts going down unexpectedly.     Medications:  Allergies as of 02/05/2020   No Known Allergies     Medication List       Accurate as of February 05, 2020 10:06 AM. If you have any questions, ask your nurse or doctor.        acetaminophen 325 MG tablet Commonly known as: TYLENOL Take 650 mg by mouth every 6 (six) hours as needed for moderate pain or headache.   HYDROcodone-acetaminophen  5-325 MG tablet Commonly known as: Norco Take 1-2 tablets by mouth every 6 (six) hours as needed for moderate pain.   hydroxyurea 500 MG capsule Commonly known as: HYDREA Take 1 capsule (500 mg total) by mouth 2 (two) times daily. May take with food to minimize GI side effects.   sertraline 50 MG tablet Commonly known as: ZOLOFT Take 1 tablet (50 mg total) by mouth daily.       Allergies: No Known Allergies  Past Medical History, Surgical history, Social history, and Family History were reviewed and updated.  Review of Systems: Review of Systems  Constitutional: Negative.   HENT: Negative.   Eyes: Negative.   Respiratory: Negative.   Cardiovascular: Negative.   Gastrointestinal: Positive for abdominal pain.  Genitourinary: Negative.   Musculoskeletal: Negative.   Skin: Negative.   Neurological: Negative.   Endo/Heme/Allergies: Negative.   Psychiatric/Behavioral: Negative.      Physical Exam:  weight is 183 lb 12 oz (83.3 kg). His oral temperature is 98.8 F (37.1 C). His blood pressure is 104/74 and his pulse is 78. His respiration is 17 and oxygen saturation is 99%.   Wt Readings from Last 3 Encounters:  02/05/20 183 lb  12 oz (83.3 kg)  11/25/19 186 lb (84.4 kg)  10/16/19 177 lb (80.3 kg)    Physical Exam Vitals reviewed.  HENT:     Head: Normocephalic and atraumatic.  Eyes:     Pupils: Pupils are equal, round, and reactive to light.  Cardiovascular:     Rate and Rhythm: Normal rate and regular rhythm.     Heart sounds: Normal heart sounds.  Pulmonary:     Effort: Pulmonary effort is normal.     Breath sounds: Normal breath sounds.  Abdominal:     General: Bowel sounds are normal.     Palpations: Abdomen is soft.     Comments: Abdominal exam shows healing laparoscopy scars.  I think he has 5 scars.  They are well-healed.  There is no erythema.  There is no exudate.  His abdomen is slightly distended.  He has slightly decreased bowel sounds.  There is no  guarding.  He has no palpable abdominal mass.  Is no palpable liver or spleen tip.  Musculoskeletal:        General: No tenderness or deformity. Normal range of motion.     Cervical back: Normal range of motion.  Lymphadenopathy:     Cervical: No cervical adenopathy.  Skin:    General: Skin is warm and dry.     Findings: No erythema or rash.  Neurological:     Mental Status: He is alert and oriented to person, place, and time.  Psychiatric:        Behavior: Behavior normal.        Thought Content: Thought content normal.        Judgment: Judgment normal.      Lab Results  Component Value Date   WBC 6.6 02/05/2020   HGB 16.3 02/05/2020   HCT 46.7 02/05/2020   MCV 99.6 02/05/2020   PLT 341 02/05/2020   Lab Results  Component Value Date   FERRITIN 180 11/25/2019   IRON 86 11/25/2019   TIBC 332 11/25/2019   UIBC 246 11/25/2019   IRONPCTSAT 26 11/25/2019   Lab Results  Component Value Date   RETICCTPCT 1.0 08/07/2019   RBC 4.69 02/05/2020   No results found for: KPAFRELGTCHN, LAMBDASER, KAPLAMBRATIO No results found for: IGGSERUM, IGA, IGMSERUM No results found for: Odetta Pink, SPEI   Chemistry      Component Value Date/Time   NA 141 02/05/2020 0853   K 4.0 02/05/2020 0853   CL 104 02/05/2020 0853   CO2 28 02/05/2020 0853   BUN 21 02/05/2020 0853   CREATININE 1.14 02/05/2020 0853      Component Value Date/Time   CALCIUM 10.0 02/05/2020 0853   ALKPHOS 77 02/05/2020 0853   AST 20 02/05/2020 0853   ALT 17 02/05/2020 0853   BILITOT 0.6 02/05/2020 0853       Impression and Plan: Thomas Hancock is a very pleasant 64 yo [name] with Polycythemia vera -  JAK 2 positive; 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  The renal cell carcinoma has been treated with surgery.  Thankfully he did not require complete nephrectomy.  Again I do not think that the renal  cell carcinoma will come back.  The chance of this recurrent should be less than 10%.  His hemoglobin is doing okay.  I do not believe he needs to be phlebotomized right now.  I think that we can now get him back in 2  months.  I do not see need to make a change with his Hydrea at this point.    Volanda Napoleon, MD 8/12/202110:06 AM

## 2020-02-17 ENCOUNTER — Other Ambulatory Visit: Payer: Self-pay | Admitting: Family Medicine

## 2020-02-17 DIAGNOSIS — F320 Major depressive disorder, single episode, mild: Secondary | ICD-10-CM

## 2020-02-19 ENCOUNTER — Other Ambulatory Visit (HOSPITAL_COMMUNITY): Payer: Self-pay | Admitting: Urology

## 2020-02-19 ENCOUNTER — Ambulatory Visit (HOSPITAL_COMMUNITY)
Admission: RE | Admit: 2020-02-19 | Discharge: 2020-02-19 | Disposition: A | Discharge status: Home / Self Care | Payer: 59 | Source: Ambulatory Visit | Attending: Urology | Admitting: Urology

## 2020-02-19 ENCOUNTER — Other Ambulatory Visit: Payer: Self-pay

## 2020-02-19 DIAGNOSIS — C641 Malignant neoplasm of right kidney, except renal pelvis: Secondary | ICD-10-CM

## 2020-02-19 IMAGING — DX DG CHEST 2V
2 series · 2 of 2 positions shown · non-contrast
Comparison: [DATE].

CLINICAL DATA: Right-sided renal cell carcinoma.

EXAM:
CHEST - 2 VIEW

[chest pa]
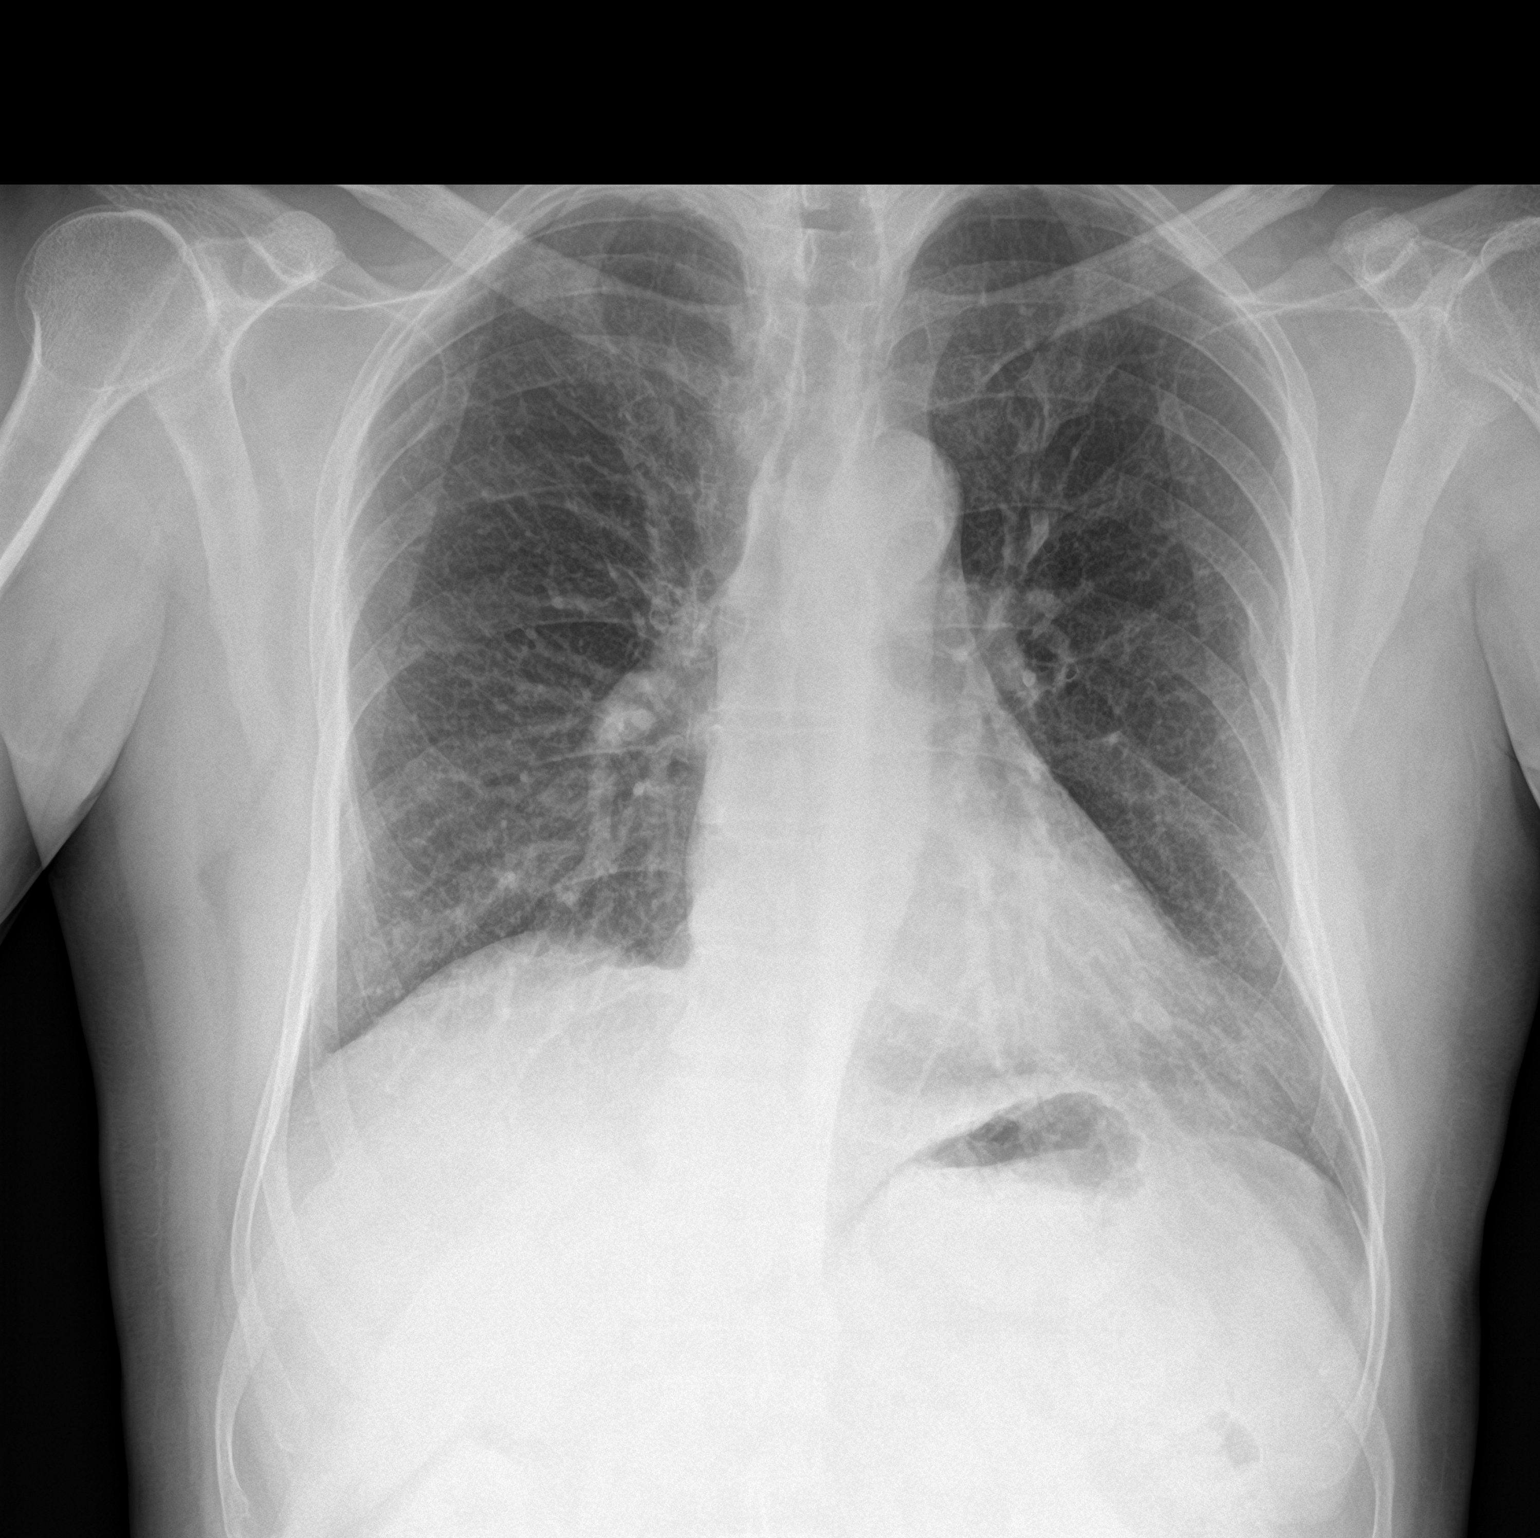

[chest lat]
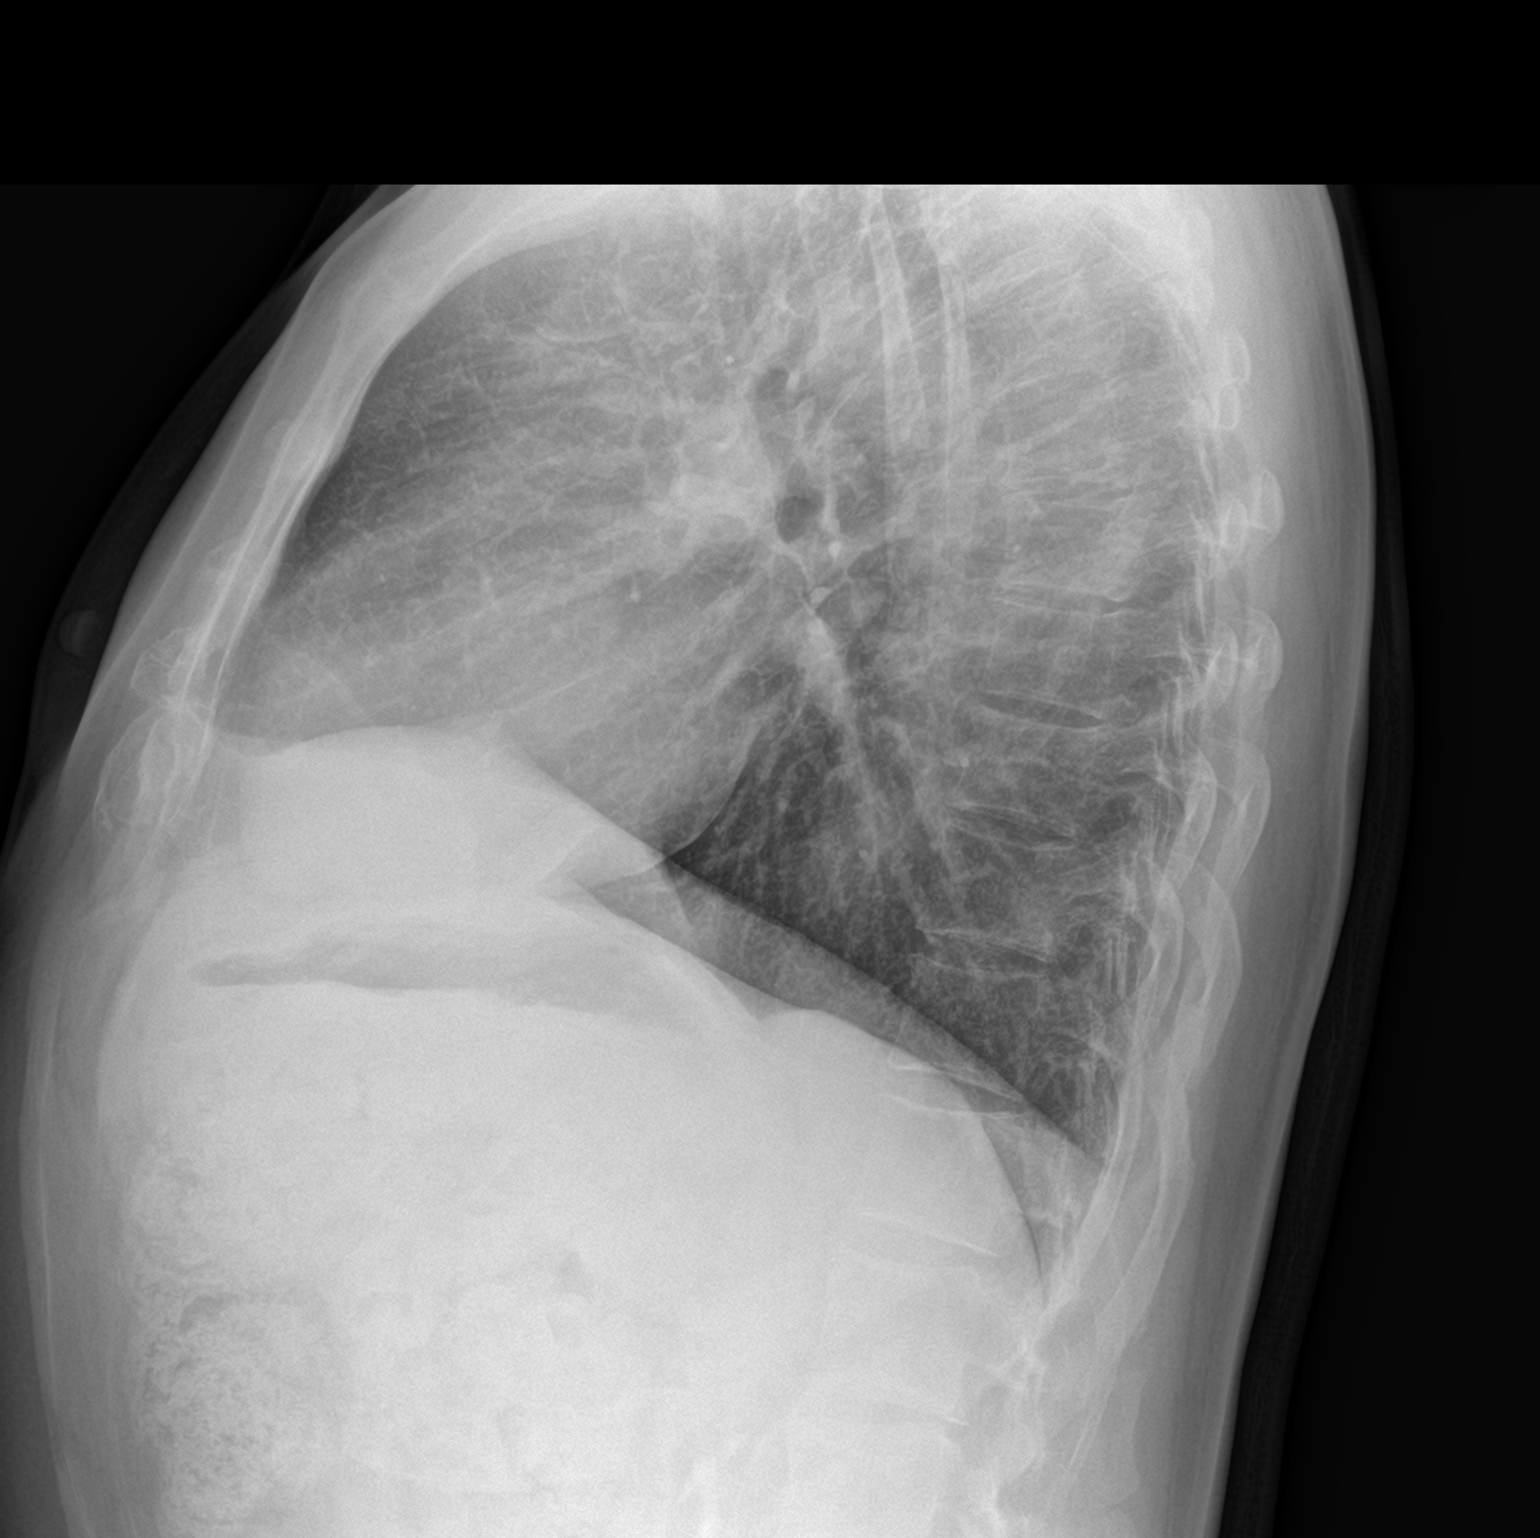

[2 of 2 positions shown; findings below may reference images not displayed]

FINDINGS: The heart size and mediastinal contours are within normal limits.
Both lungs are clear. The visualized skeletal structures are
unremarkable.
IMPRESSION: No active cardiopulmonary disease.

## 2020-02-23 ENCOUNTER — Other Ambulatory Visit: Payer: Self-pay | Admitting: Hematology & Oncology

## 2020-03-18 ENCOUNTER — Other Ambulatory Visit: Payer: Self-pay | Admitting: Family Medicine

## 2020-03-18 DIAGNOSIS — F320 Major depressive disorder, single episode, mild: Secondary | ICD-10-CM

## 2020-04-08 ENCOUNTER — Inpatient Hospital Stay: Payer: 59 | Attending: Family

## 2020-04-08 ENCOUNTER — Encounter: Payer: Self-pay | Admitting: Hematology & Oncology

## 2020-04-08 ENCOUNTER — Inpatient Hospital Stay (HOSPITAL_BASED_OUTPATIENT_CLINIC_OR_DEPARTMENT_OTHER): Payer: 59 | Admitting: Hematology & Oncology

## 2020-04-08 ENCOUNTER — Other Ambulatory Visit: Payer: Self-pay

## 2020-04-08 ENCOUNTER — Inpatient Hospital Stay: Payer: 59

## 2020-04-08 VITALS — BP 105/71 | HR 64 | Temp 98.0°F | Resp 18 | Ht 72.0 in | Wt 184.1 lb

## 2020-04-08 DIAGNOSIS — Z85528 Personal history of other malignant neoplasm of kidney: Secondary | ICD-10-CM | POA: Insufficient documentation

## 2020-04-08 DIAGNOSIS — C641 Malignant neoplasm of right kidney, except renal pelvis: Secondary | ICD-10-CM | POA: Diagnosis not present

## 2020-04-08 DIAGNOSIS — Z905 Acquired absence of kidney: Secondary | ICD-10-CM | POA: Diagnosis not present

## 2020-04-08 DIAGNOSIS — D45 Polycythemia vera: Secondary | ICD-10-CM | POA: Diagnosis not present

## 2020-04-08 DIAGNOSIS — Z79899 Other long term (current) drug therapy: Secondary | ICD-10-CM | POA: Insufficient documentation

## 2020-04-08 LAB — CBC WITH DIFFERENTIAL (CANCER CENTER ONLY)
Abs Immature Granulocytes: 0.02 10*3/uL (ref 0.00–0.07)
Basophils Absolute: 0.1 10*3/uL (ref 0.0–0.1)
Basophils Relative: 1 %
Eosinophils Absolute: 0.1 10*3/uL (ref 0.0–0.5)
Eosinophils Relative: 2 %
HCT: 44.9 % (ref 39.0–52.0)
Hemoglobin: 15.4 g/dL (ref 13.0–17.0)
Immature Granulocytes: 0 %
Lymphocytes Relative: 25 %
Lymphs Abs: 1.2 10*3/uL (ref 0.7–4.0)
MCH: 34 pg (ref 26.0–34.0)
MCHC: 34.3 g/dL (ref 30.0–36.0)
MCV: 99.1 fL (ref 80.0–100.0)
Monocytes Absolute: 0.5 10*3/uL (ref 0.1–1.0)
Monocytes Relative: 10 %
Neutro Abs: 3 10*3/uL (ref 1.7–7.7)
Neutrophils Relative %: 62 %
Platelet Count: 303 10*3/uL (ref 150–400)
RBC: 4.53 MIL/uL (ref 4.22–5.81)
RDW: 13.2 % (ref 11.5–15.5)
WBC Count: 4.8 10*3/uL (ref 4.0–10.5)
nRBC: 0 % (ref 0.0–0.2)

## 2020-04-08 LAB — CMP (CANCER CENTER ONLY)
ALT: 14 U/L (ref 0–44)
AST: 18 U/L (ref 15–41)
Albumin: 4.5 g/dL (ref 3.5–5.0)
Alkaline Phosphatase: 66 U/L (ref 38–126)
Anion gap: 7 (ref 5–15)
BUN: 22 mg/dL (ref 8–23)
CO2: 29 mmol/L (ref 22–32)
Calcium: 9.6 mg/dL (ref 8.9–10.3)
Chloride: 104 mmol/L (ref 98–111)
Creatinine: 1.12 mg/dL (ref 0.61–1.24)
GFR, Estimated: >60 mL/min (ref >60)
Glucose, Bld: 111 mg/dL — ABNORMAL HIGH (ref 70–99)
Potassium: 4 mmol/L (ref 3.5–5.1)
Sodium: 140 mmol/L (ref 135–145)
Total Bilirubin: 0.8 mg/dL (ref 0.3–1.2)
Total Protein: 7.4 g/dL (ref 6.5–8.1)

## 2020-04-08 LAB — LACTATE DEHYDROGENASE: LDH: 168 U/L (ref 98–192)

## 2020-04-08 LAB — SAVE SMEAR(SSMR), FOR PROVIDER SLIDE REVIEW

## 2020-04-08 NOTE — Progress Notes (Signed)
Hematology and Oncology Follow Up Visit  Add Dinapoli [id number] 08-11-1955 64 y.o. 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 Halt is here today with his wife for follow-up.  He continues to do quite well.  He recently saw the urologist.  According to his wife, he did have a CT scan.  Everything appeared to be quite good.  He and his family did not go down to [place] we last saw him.  He was supposed to go down but did not make it.  He is working.  He is having more activity.  He says he does sweat more easily.  When we last saw him, his ferritin was 64 with an iron saturation of 22%.  There has been no problems with the Hydrea or with the aspirin.  I told him to keep on the aspirin.  He has had no rashes.  There has been no headache.  He has had no pain in the hands or feet.  There is been no leg swelling.    Overall, the performance status is ECOG is 0.    Medications:  Allergies as of 04/08/2020   No Known Allergies     Medication List       Accurate as of April 08, 2020  1:11 PM. If you have any questions, ask your nurse or doctor.        acetaminophen 325 MG tablet Commonly known as: TYLENOL Take 650 mg by mouth every 6 (six) hours as needed for moderate pain or headache.   HYDROcodone-acetaminophen 5-325 MG tablet Commonly known as: Norco Take 1-2 tablets by mouth every 6 (six) hours as needed for moderate pain.   hydroxyurea 500 MG capsule Commonly known as: HYDREA TAKE 1 CAPSULE BY MOUTH TWICE DAILY, WITH FOOD TO MINIMIZE GI SIDE EFFECTS   sertraline 50 MG tablet Commonly known as: ZOLOFT Take 1  tablet by mouth once daily       Allergies: No Known Allergies  Past Medical History, Surgical history, Social history, and Family History were reviewed and updated.  Review of Systems: Review of Systems  Constitutional: Negative.   HENT: Negative.   Eyes: Negative.   Respiratory: Negative.   Cardiovascular: Negative.   Gastrointestinal: Positive for abdominal pain.  Genitourinary: Negative.   Musculoskeletal: Negative.   Skin: Negative.   Neurological: Negative.   Endo/Heme/Allergies: Negative.   Psychiatric/Behavioral: Negative.      Physical Exam:  height is 6' (1.829 m) and weight is 184 lb 1.9 oz (83.5 kg). His oral temperature is 98 F (36.7 C). His blood pressure is 105/71 and his pulse is 64. His respiration is 18 and oxygen saturation is 100%.   Wt Readings from Last 3 Encounters:  04/08/20 184 lb 1.9 oz (83.5 kg)  02/05/20 183 lb 12 oz (83.3 kg)  11/25/19 186 lb (84.4 kg)    Physical Exam Vitals reviewed.  HENT:     Head: Normocephalic and atraumatic.  Eyes:     Pupils: Pupils are equal, round, and reactive to light.  Cardiovascular:     Rate and Rhythm: Normal rate and regular rhythm.  Heart sounds: Normal heart sounds.  Pulmonary:     Effort: Pulmonary effort is normal.     Breath sounds: Normal breath sounds.  Abdominal:     General: Bowel sounds are normal.     Palpations: Abdomen is soft.     Comments: Abdominal exam shows healing laparoscopy scars.  I think he has 5 scars.  They are well-healed.  There is no erythema.  There is no exudate.  His abdomen is slightly distended.  He has slightly decreased bowel sounds.  There is no guarding.  He has no palpable abdominal mass.  Is no palpable liver or spleen tip.  Musculoskeletal:        General: No tenderness or deformity. Normal range of motion.     Cervical back: Normal range of motion.  Lymphadenopathy:     Cervical: No cervical adenopathy.  Skin:    General: Skin is warm and dry.      Findings: No erythema or rash.  Neurological:     Mental Status: He is alert and oriented to person, place, and time.  Psychiatric:        Behavior: Behavior normal.        Thought Content: Thought content normal.        Judgment: Judgment normal.      Lab Results  Component Value Date   WBC 4.8 04/08/2020   HGB 15.4 04/08/2020   HCT 44.9 04/08/2020   MCV 99.1 04/08/2020   PLT 303 04/08/2020   Lab Results  Component Value Date   FERRITIN 64 02/05/2020   IRON 73 02/05/2020   TIBC 338 02/05/2020   UIBC 265 02/05/2020   IRONPCTSAT 22 02/05/2020   Lab Results  Component Value Date   RETICCTPCT 1.0 08/07/2019   RBC 4.53 04/08/2020   No results found for: KPAFRELGTCHN, LAMBDASER, KAPLAMBRATIO No results found for: IGGSERUM, IGA, IGMSERUM No results found for: Ronnald Ramp, A1GS, Nelida Meuse, SPEI   Chemistry      Component Value Date/Time   NA 140 04/08/2020 1132   K 4.0 04/08/2020 1132   CL 104 04/08/2020 1132   CO2 29 04/08/2020 1132   BUN 22 04/08/2020 1132   CREATININE 1.12 04/08/2020 1132      Component Value Date/Time   CALCIUM 9.6 04/08/2020 1132   ALKPHOS 66 04/08/2020 1132   AST 18 04/08/2020 1132   ALT 14 04/08/2020 1132   BILITOT 0.8 04/08/2020 1132       Impression and Plan: Mr. Walla is a very pleasant 64 yo [name] with Polycythemia vera -  JAK 2 positive; 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  The renal cell carcinoma has been treated with surgery.  Thankfully he did not require complete nephrectomy.  Again I do not think that the renal cell carcinoma will come back.  The chance of this recurrent should be less than 10%.  His hemoglobin is doing okay.  I do not believe he needs to be phlebotomized right now.  I think that we can now get him back in 3 months.   Volanda Napoleon, MD 10/14/20211:11 PM

## 2020-04-09 ENCOUNTER — Encounter: Payer: Self-pay | Admitting: *Deleted

## 2020-04-09 LAB — IRON AND TIBC
Iron: 121 ug/dL (ref 42–163)
Saturation Ratios: 40 % (ref 20–55)
TIBC: 305 ug/dL (ref 202–409)
UIBC: 184 ug/dL (ref 117–376)

## 2020-04-09 LAB — FERRITIN: Ferritin: 104 ng/mL (ref 24–336)

## 2020-04-12 ENCOUNTER — Inpatient Hospital Stay: Payer: 59

## 2020-04-12 ENCOUNTER — Other Ambulatory Visit: Payer: Self-pay | Admitting: Family

## 2020-04-12 ENCOUNTER — Other Ambulatory Visit: Payer: Self-pay

## 2020-04-12 DIAGNOSIS — D45 Polycythemia vera: Secondary | ICD-10-CM

## 2020-04-12 MED ORDER — SODIUM CHLORIDE 0.9 % IV SOLN
1000.0000 mL | INTRAVENOUS | Status: AC
  Administered 2020-04-12: 1000 mL via INTRAVENOUS
  Filled 2020-04-12 (×2): qty 1000

## 2020-04-12 NOTE — Progress Notes (Signed)
Thomas Hancock presents today for phlebotomy per MD orders. Phlebotomy procedure started at 0848 and ended at 0858 347 grams removed via 18 gauge needle to right AC.  Patient observed for 30 minutes after procedure without any incident. Patient started complaining of dizziness, procedure stopped and IV started at 0858. Patient placed in supine position, replacement fluids started. BP 78/56 HR 51.  0908, patient states he is starting to feel much better, BP 85/62 HR 56.  0915, Patient BP 92/69 HR 55. Also provided with snack and oral fluids. 1019: BP 108/67 HR 62, patient has no complaints, states he feels better, no complaints of dizziness.  Pt discharged in no apparent distress. Pt left ambulatory without assistance.  Pt aware of discharge instructions and verbalized understanding and had no further questions.

## 2020-04-12 NOTE — Patient Instructions (Signed)
Therapeutic Phlebotomy Therapeutic phlebotomy is the planned removal of blood from a person's body for the purpose of treating a medical condition. The procedure is similar to donating blood. Usually, about a pint (470 mL, or 0.47 L) of blood is removed. The average adult has 9-12 pints (4.3-5.7 L) of blood in the body. Therapeutic phlebotomy may be used to treat the following medical conditions:  Hemochromatosis. This is a condition in which the blood contains too much iron.  Polycythemia vera. This is a condition in which the blood contains too many red blood cells.  Porphyria cutanea tarda. This is a disease in which an important part of hemoglobin is not made properly. It results in the buildup of abnormal amounts of porphyrins in the body.  Sickle cell disease. This is a condition in which the red blood cells form an abnormal crescent shape rather than a round shape. Tell a health care provider about:  Any allergies you have.  All medicines you are taking, including vitamins, herbs, eye drops, creams, and over-the-counter medicines.  Any problems you or family members have had with anesthetic medicines.  Any blood disorders you have.  Any surgeries you have had.  Any medical conditions you have.  Whether you are pregnant or may be pregnant. What are the risks? Generally, this is a safe procedure. However, problems may occur, including:  Nausea or light-headedness.  Low blood pressure (hypotension).  Soreness, bleeding, swelling, or bruising at the needle insertion site.  Infection. What happens before the procedure?  Follow instructions from your health care provider about eating or drinking restrictions.  Ask your health care provider about: ? Changing or stopping your regular medicines. This is especially important if you are taking diabetes medicines or blood thinners (anticoagulants). ? Taking medicines such as aspirin and ibuprofen. These medicines can thin your  blood. Do not take these medicines unless your health care provider tells you to take them. ? Taking over-the-counter medicines, vitamins, herbs, and supplements.  Wear clothing with sleeves that can be raised above the elbow.  Plan to have someone take you home from the hospital or clinic.  You may have a blood sample taken.  Your blood pressure, pulse rate, and breathing rate will be measured. What happens during the procedure?   To lower your risk of infection: ? Your health care team will wash or sanitize their hands. ? Your skin will be cleaned with an antiseptic.  You may be given a medicine to numb the area (local anesthetic).  A tourniquet will be placed on your arm.  A needle will be inserted into one of your veins.  Tubing and a collection bag will be attached to that needle.  Blood will flow through the needle and tubing into the collection bag.  The collection bag will be placed lower than your arm to allow gravity to help the flow of blood into the bag.  You may be asked to open and close your hand slowly and continually during the entire collection.  After the specified amount of blood has been removed from your body, the collection bag and tubing will be clamped.  The needle will be removed from your vein.  Pressure will be held on the site of the needle insertion to stop the bleeding.  A bandage (dressing) will be placed over the needle insertion site. The procedure may vary among health care providers and hospitals. What happens after the procedure?  Your blood pressure, pulse rate, and breathing rate will be   measured after the procedure.  You will be encouraged to drink fluids.  Your recovery will be assessed and monitored.  You can return to your normal activities as told by your health care provider. Summary  Therapeutic phlebotomy is the planned removal of blood from a person's body for the purpose of treating a medical condition.  Therapeutic  phlebotomy may be used to treat hemochromatosis, polycythemia vera, porphyria cutanea tarda, or sickle cell disease.  In the procedure, a needle is inserted and about a pint (470 mL, or 0.47 L) of blood is removed. The average adult has 9-12 pints (4.3-5.7 L) of blood in the body.  This is generally a safe procedure, but it can sometimes cause problems such as nausea, light-headedness, or low blood pressure (hypotension). This information is not intended to replace advice given to you by your health care provider. Make sure you discuss any questions you have with your health care provider. Document Revised: 06/28/2017 Document Reviewed: 06/28/2017 Elsevier Patient Education  2020 [employer] Inc[employer]  

## 2020-04-17 ENCOUNTER — Other Ambulatory Visit: Payer: Self-pay | Admitting: Family Medicine

## 2020-04-17 DIAGNOSIS — F320 Major depressive disorder, single episode, mild: Secondary | ICD-10-CM

## 2020-04-18 ENCOUNTER — Encounter: Payer: Self-pay | Admitting: Hematology & Oncology

## 2020-04-18 ENCOUNTER — Other Ambulatory Visit: Payer: Self-pay | Admitting: Hematology & Oncology

## 2020-05-23 ENCOUNTER — Other Ambulatory Visit: Payer: Self-pay

## 2020-05-23 DIAGNOSIS — F320 Major depressive disorder, single episode, mild: Secondary | ICD-10-CM

## 2020-05-24 ENCOUNTER — Other Ambulatory Visit: Payer: Self-pay | Admitting: Family Medicine

## 2020-05-24 DIAGNOSIS — F320 Major depressive disorder, single episode, mild: Secondary | ICD-10-CM

## 2020-05-24 MED ORDER — SERTRALINE HCL 50 MG PO TABS: 50.0000 mg | ORAL_TABLET | Freq: Every day | ORAL | 0 refills | Status: DC

## 2020-06-18 ENCOUNTER — Other Ambulatory Visit: Payer: Self-pay | Admitting: Hematology & Oncology

## 2020-06-18 ENCOUNTER — Other Ambulatory Visit: Payer: Self-pay | Admitting: Family Medicine

## 2020-06-18 DIAGNOSIS — F320 Major depressive disorder, single episode, mild: Secondary | ICD-10-CM

## 2020-07-08 ENCOUNTER — Other Ambulatory Visit: Payer: Self-pay

## 2020-07-08 ENCOUNTER — Inpatient Hospital Stay: Payer: 59

## 2020-07-08 ENCOUNTER — Inpatient Hospital Stay: Payer: 59 | Attending: Family

## 2020-07-08 ENCOUNTER — Telehealth: Payer: Self-pay

## 2020-07-08 ENCOUNTER — Encounter: Payer: Self-pay | Admitting: Hematology & Oncology

## 2020-07-08 ENCOUNTER — Inpatient Hospital Stay (HOSPITAL_BASED_OUTPATIENT_CLINIC_OR_DEPARTMENT_OTHER): Payer: 59 | Admitting: Hematology & Oncology

## 2020-07-08 VITALS — BP 118/73 | HR 73 | Temp 98.2°F | Resp 18 | Wt 187.0 lb

## 2020-07-08 DIAGNOSIS — C641 Malignant neoplasm of right kidney, except renal pelvis: Secondary | ICD-10-CM | POA: Diagnosis not present

## 2020-07-08 DIAGNOSIS — D45 Polycythemia vera: Secondary | ICD-10-CM

## 2020-07-08 DIAGNOSIS — Z85528 Personal history of other malignant neoplasm of kidney: Secondary | ICD-10-CM | POA: Diagnosis not present

## 2020-07-08 LAB — CMP (CANCER CENTER ONLY)
ALT: 16 U/L (ref 0–44)
AST: 22 U/L (ref 15–41)
Albumin: 4.5 g/dL (ref 3.5–5.0)
Alkaline Phosphatase: 77 U/L (ref 38–126)
Anion gap: 8 (ref 5–15)
BUN: 18 mg/dL (ref 8–23)
CO2: 29 mmol/L (ref 22–32)
Calcium: 9.7 mg/dL (ref 8.9–10.3)
Chloride: 102 mmol/L (ref 98–111)
Creatinine: 1.16 mg/dL (ref 0.61–1.24)
GFR, Estimated: >60 mL/min (ref >60)
Glucose, Bld: 153 mg/dL — ABNORMAL HIGH (ref 70–99)
Potassium: 4.1 mmol/L (ref 3.5–5.1)
Sodium: 139 mmol/L (ref 135–145)
Total Bilirubin: 0.7 mg/dL (ref 0.3–1.2)
Total Protein: 7.3 g/dL (ref 6.5–8.1)

## 2020-07-08 LAB — CBC WITH DIFFERENTIAL (CANCER CENTER ONLY)
Abs Immature Granulocytes: 0.01 10*3/uL (ref 0.00–0.07)
Basophils Absolute: 0.1 10*3/uL (ref 0.0–0.1)
Basophils Relative: 1 %
Eosinophils Absolute: 0.1 10*3/uL (ref 0.0–0.5)
Eosinophils Relative: 2 %
HCT: 44.8 % (ref 39.0–52.0)
Hemoglobin: 15.9 g/dL (ref 13.0–17.0)
Immature Granulocytes: 0 %
Lymphocytes Relative: 23 %
Lymphs Abs: 1.2 10*3/uL (ref 0.7–4.0)
MCH: 35 pg — ABNORMAL HIGH (ref 26.0–34.0)
MCHC: 35.5 g/dL (ref 30.0–36.0)
MCV: 98.7 fL (ref 80.0–100.0)
Monocytes Absolute: 0.3 10*3/uL (ref 0.1–1.0)
Monocytes Relative: 7 %
Neutro Abs: 3.3 10*3/uL (ref 1.7–7.7)
Neutrophils Relative %: 67 %
Platelet Count: 305 10*3/uL (ref 150–400)
RBC: 4.54 MIL/uL (ref 4.22–5.81)
RDW: 11.5 % (ref 11.5–15.5)
WBC Count: 5 10*3/uL (ref 4.0–10.5)
nRBC: 0 % (ref 0.0–0.2)

## 2020-07-08 LAB — IRON AND TIBC
Iron: 79 ug/dL (ref 42–163)
Saturation Ratios: 24 % (ref 20–55)
TIBC: 326 ug/dL (ref 202–409)
UIBC: 248 ug/dL (ref 117–376)

## 2020-07-08 LAB — FERRITIN: Ferritin: 54 ng/mL (ref 24–336)

## 2020-07-08 LAB — RETICULOCYTES
Immature Retic Fract: 6.7 % (ref 2.3–15.9)
RBC.: 4.54 MIL/uL (ref 4.22–5.81)
Retic Count, Absolute: 55.8 10*3/uL (ref 19.0–186.0)
Retic Ct Pct: 1.2 % (ref 0.4–3.1)

## 2020-07-08 LAB — LACTATE DEHYDROGENASE: LDH: 143 U/L (ref 98–192)

## 2020-07-08 NOTE — Progress Notes (Signed)
Hematology and Oncology Follow Up Visit  Latoya Diskin [id number] 26-Nov-1955 65 y.o. 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 Maclaughlin is here today for follow-up.  His wife actually is in [place].  She has been over there for about 6 weeks.  She should be coming back soon.  She is seeing family over there.  He is doing quite well.  He had no problems over Christmas and New Year's.  He showed me a picture of his grandson.  He was phlebotomized back in October.  At that time, his iron saturation was 40% and his ferritin was 104.  He has had no problems with abdominal pain.  He has had no change in bowel or bladder habits.  There has been no issues with cough.  He has had no fever.  There has been no rashes.  He is doing well with the Hydrea and aspirin.  So far, his platelet count has been holding nice and steady.  Overall, I would say that his performance status is ECOG 0.    Medications:  Allergies as of 07/08/2020   No Known Allergies     Medication List       Accurate as of July 08, 2020 10:29 AM. If you have any questions, ask your nurse or doctor.        acetaminophen 325 MG tablet Commonly known as: TYLENOL Take 650 mg by mouth every 6 (six) hours as needed for moderate pain or headache.   HYDROcodone-acetaminophen 5-325 MG tablet Commonly known as: Norco Take 1-2 tablets by mouth every 6 (six) hours as needed for moderate pain.   hydroxyurea 500 MG capsule Commonly known as: HYDREA TAKE 1 CAPSULE BY MOUTH TWICE DAILY WITH FOOD TO MINIMIZE GI SISE EFFECTS   sertraline 50 MG tablet Commonly known as:  ZOLOFT Take 1 tablet by mouth once daily   sertraline 50 MG tablet Commonly known as: ZOLOFT Take 1 tablet by mouth once daily       Allergies: No Known Allergies  Past Medical History, Surgical history, Social history, and Family History were reviewed and updated.  Review of Systems: Review of Systems  Constitutional: Negative.   HENT: Negative.   Eyes: Negative.   Respiratory: Negative.   Cardiovascular: Negative.   Gastrointestinal: Positive for abdominal pain.  Genitourinary: Negative.   Musculoskeletal: Negative.   Skin: Negative.   Neurological: Negative.   Endo/Heme/Allergies: Negative.   Psychiatric/Behavioral: Negative.      Physical Exam:  weight is 187 lb (84.8 kg). His oral temperature is 98.2 F (36.8 C). His blood pressure is 118/73 and his pulse is 73. His respiration is 18 and oxygen saturation is 95%.   Wt Readings from Last 3 Encounters:  07/08/20 187 lb (84.8 kg)  04/08/20 184 lb 1.9 oz (83.5 kg)  02/05/20 183 lb 12 oz (83.3 kg)    Physical Exam Vitals reviewed.  HENT:     Head: Normocephalic and atraumatic.  Eyes:     Pupils: Pupils are equal, round, and reactive to light.  Cardiovascular:     Rate and Rhythm: Normal rate and regular rhythm.  Heart sounds: Normal heart sounds.  Pulmonary:     Effort: Pulmonary effort is normal.     Breath sounds: Normal breath sounds.  Abdominal:     General: Bowel sounds are normal.     Palpations: Abdomen is soft.     Comments: Abdominal exam shows healing laparoscopy scars.  I think he has 5 scars.  They are well-healed.  There is no erythema.  There is no exudate.  His abdomen is slightly distended.  He has slightly decreased bowel sounds.  There is no guarding.  He has no palpable abdominal mass.  Is no palpable liver or spleen tip.  Musculoskeletal:        General: No tenderness or deformity. Normal range of motion.     Cervical back: Normal range of motion.  Lymphadenopathy:     Cervical: No  cervical adenopathy.  Skin:    General: Skin is warm and dry.     Findings: No erythema or rash.  Neurological:     Mental Status: He is alert and oriented to person, place, and time.  Psychiatric:        Behavior: Behavior normal.        Thought Content: Thought content normal.        Judgment: Judgment normal.      Lab Results  Component Value Date   WBC 5.0 07/08/2020   HGB 15.9 07/08/2020   HCT 44.8 07/08/2020   MCV 98.7 07/08/2020   PLT 305 07/08/2020   Lab Results  Component Value Date   FERRITIN 104 04/08/2020   IRON 121 04/08/2020   TIBC 305 04/08/2020   UIBC 184 04/08/2020   IRONPCTSAT 40 04/08/2020   Lab Results  Component Value Date   RETICCTPCT 1.2 07/08/2020   RBC 4.54 07/08/2020   RBC 4.54 07/08/2020   No results found for: KPAFRELGTCHN, LAMBDASER, KAPLAMBRATIO No results found for: IGGSERUM, IGA, IGMSERUM No results found for: Odetta Pink, SPEI   Chemistry      Component Value Date/Time   NA 139 07/08/2020 0911   K 4.1 07/08/2020 0911   CL 102 07/08/2020 0911   CO2 29 07/08/2020 0911   BUN 18 07/08/2020 0911   CREATININE 1.16 07/08/2020 0911      Component Value Date/Time   CALCIUM 9.7 07/08/2020 0911   ALKPHOS 77 07/08/2020 0911   AST 22 07/08/2020 0911   ALT 16 07/08/2020 0911   BILITOT 0.7 07/08/2020 0911       Impression and Plan: Mr. Garcon is a very pleasant 65 yo [name] with Polycythemia vera -  JAK 2 positive; 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  The renal cell carcinoma has been treated with surgery.  Thankfully he did not require complete nephrectomy.  Again I do not think that the renal cell carcinoma will come back.  The chance of this recurrent should be less than 10%.  We will have to see what his iron studies show.  Hopefully, he will not need to be phlebotomized.  His platelet count is holding steady so  we do not have to adjust the Hydrea dose.    I think that we can now get him back in 3 months.   Volanda Napoleon, MD 1/13/202210:29 AM

## 2020-07-08 NOTE — Telephone Encounter (Signed)
appts made per 07/08/20 los and mailed to pt    aom

## 2020-07-08 NOTE — Telephone Encounter (Signed)
appts made and printed for pt per 07/08/20 los  aom

## 2020-07-20 ENCOUNTER — Other Ambulatory Visit: Payer: Self-pay | Admitting: Hematology & Oncology

## 2020-07-20 ENCOUNTER — Other Ambulatory Visit: Payer: Self-pay | Admitting: Family Medicine

## 2020-07-20 DIAGNOSIS — F320 Major depressive disorder, single episode, mild: Secondary | ICD-10-CM

## 2020-08-26 ENCOUNTER — Other Ambulatory Visit: Payer: Self-pay | Admitting: Family Medicine

## 2020-08-26 DIAGNOSIS — F320 Major depressive disorder, single episode, mild: Secondary | ICD-10-CM

## 2020-09-11 ENCOUNTER — Other Ambulatory Visit: Payer: Self-pay | Admitting: Family Medicine

## 2020-09-11 DIAGNOSIS — F320 Major depressive disorder, single episode, mild: Secondary | ICD-10-CM

## 2020-10-07 ENCOUNTER — Other Ambulatory Visit: Payer: Self-pay

## 2020-10-07 ENCOUNTER — Inpatient Hospital Stay (HOSPITAL_BASED_OUTPATIENT_CLINIC_OR_DEPARTMENT_OTHER): Payer: 59 | Admitting: Hematology & Oncology

## 2020-10-07 ENCOUNTER — Encounter: Payer: Self-pay | Admitting: Hematology & Oncology

## 2020-10-07 ENCOUNTER — Inpatient Hospital Stay: Payer: 59 | Attending: Family

## 2020-10-07 VITALS — BP 102/65 | HR 67 | Temp 98.5°F | Resp 16 | Wt 185.0 lb

## 2020-10-07 DIAGNOSIS — Z85528 Personal history of other malignant neoplasm of kidney: Secondary | ICD-10-CM | POA: Diagnosis not present

## 2020-10-07 DIAGNOSIS — C641 Malignant neoplasm of right kidney, except renal pelvis: Secondary | ICD-10-CM | POA: Diagnosis not present

## 2020-10-07 DIAGNOSIS — D45 Polycythemia vera: Secondary | ICD-10-CM | POA: Diagnosis present

## 2020-10-07 LAB — CBC WITH DIFFERENTIAL (CANCER CENTER ONLY)
Abs Immature Granulocytes: 0.01 10*3/uL (ref 0.00–0.07)
Basophils Absolute: 0.1 10*3/uL (ref 0.0–0.1)
Basophils Relative: 1 %
Eosinophils Absolute: 0.1 10*3/uL (ref 0.0–0.5)
Eosinophils Relative: 2 %
HCT: 44.8 % (ref 39.0–52.0)
Hemoglobin: 15.4 g/dL (ref 13.0–17.0)
Immature Granulocytes: 0 %
Lymphocytes Relative: 26 %
Lymphs Abs: 1.5 10*3/uL (ref 0.7–4.0)
MCH: 33.4 pg (ref 26.0–34.0)
MCHC: 34.4 g/dL (ref 30.0–36.0)
MCV: 97.2 fL (ref 80.0–100.0)
Monocytes Absolute: 0.6 10*3/uL (ref 0.1–1.0)
Monocytes Relative: 11 %
Neutro Abs: 3.4 10*3/uL (ref 1.7–7.7)
Neutrophils Relative %: 60 %
Platelet Count: 347 10*3/uL (ref 150–400)
RBC: 4.61 MIL/uL (ref 4.22–5.81)
RDW: 12 % (ref 11.5–15.5)
WBC Count: 5.7 10*3/uL (ref 4.0–10.5)
nRBC: 0 % (ref 0.0–0.2)

## 2020-10-07 LAB — IRON AND TIBC
Iron: 87 ug/dL (ref 42–163)
Saturation Ratios: 26 % (ref 20–55)
TIBC: 338 ug/dL (ref 202–409)
UIBC: 251 ug/dL (ref 117–376)

## 2020-10-07 LAB — CMP (CANCER CENTER ONLY)
ALT: 17 U/L (ref 0–44)
AST: 23 U/L (ref 15–41)
Albumin: 4.4 g/dL (ref 3.5–5.0)
Alkaline Phosphatase: 64 U/L (ref 38–126)
Anion gap: 8 (ref 5–15)
BUN: 23 mg/dL (ref 8–23)
CO2: 29 mmol/L (ref 22–32)
Calcium: 9.6 mg/dL (ref 8.9–10.3)
Chloride: 102 mmol/L (ref 98–111)
Creatinine: 1.13 mg/dL (ref 0.61–1.24)
GFR, Estimated: >60 mL/min (ref >60)
Glucose, Bld: 129 mg/dL — ABNORMAL HIGH (ref 70–99)
Potassium: 4.2 mmol/L (ref 3.5–5.1)
Sodium: 139 mmol/L (ref 135–145)
Total Bilirubin: 0.7 mg/dL (ref 0.3–1.2)
Total Protein: 7.3 g/dL (ref 6.5–8.1)

## 2020-10-07 LAB — FERRITIN: Ferritin: 75 ng/mL (ref 24–336)

## 2020-10-07 LAB — LACTATE DEHYDROGENASE: LDH: 146 U/L (ref 98–192)

## 2020-10-07 NOTE — Progress Notes (Signed)
Hematology and Oncology Follow Up Visit  Mitchell Iwanicki [id number] 27-Jun-1955 65 y.o. 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 Pettengill is here today for follow-up.  The problem that he is having right now is that there is some memory difficulty.  I was talking to him.  He certainly has some difficulty with recall.  He said this is been going on for couple months.  He has not yet talked to his family doctor about this.  I am not sure as to why he would have this.  I would not think that Hydrea would cause this.  His platelet count and white cell count have not been all that high.  I think he is probably going to need a MRI just to make sure that there is nothing going on with respect to the renal cell.  I would not think this would have metastasized.  Otherwise, he says he is feeling pretty well.  When we last saw him back in January, his ferritin was 54 with an iron saturation of 24%.  He has had no fever.  There is been no problems with Covid.  He has had no rashes.  He has had no weakness.  No visual changes.  He has had no speech issues.  Overall, his performance status is ECOG 1.   Medications:  Allergies as of 10/07/2020   No Known Allergies     Medication List       Accurate as of October 07, 2020  9:40 AM. If you have any questions, ask your nurse or doctor.        acetaminophen 325 MG tablet Commonly known as: TYLENOL Take 650 mg by mouth every 6 (six) hours as needed for moderate pain or headache.   HYDROcodone-acetaminophen 5-325 MG tablet Commonly known as: Norco Take 1-2 tablets by mouth every 6 (six) hours as needed  for moderate pain.   hydroxyurea 500 MG capsule Commonly known as: HYDREA TAKE 1 CAPSULE BY MOUTH TWICE DAILY WITH FOOD TO MINIMIZE GI SISE EFFECTS   sertraline 50 MG tablet Commonly known as: ZOLOFT Take 1 tablet by mouth once daily       Allergies: No Known Allergies  Past Medical History, Surgical history, Social history, and Family History were reviewed and updated.  Review of Systems: Review of Systems  Constitutional: Negative.   HENT: Negative.   Eyes: Negative.   Respiratory: Negative.   Cardiovascular: Negative.   Gastrointestinal: Positive for abdominal pain.  Genitourinary: Negative.   Musculoskeletal: Negative.   Skin: Negative.   Neurological: Negative.   Endo/Heme/Allergies: Negative.   Psychiatric/Behavioral: Negative.      Physical Exam:  weight is 185 lb (83.9 kg). His oral temperature is 98.5 F (36.9 C). His blood pressure is 102/65 and his pulse is 67. His respiration is 16 and oxygen saturation is 100%.   Wt Readings from Last 3 Encounters:  10/07/20 185 lb (83.9 kg)  07/08/20 187 lb (84.8 kg)  04/08/20 184 lb 1.9 oz (83.5 kg)    Physical Exam Vitals reviewed.  HENT:     Head: Normocephalic and atraumatic.  Eyes:  Pupils: Pupils are equal, round, and reactive to light.  Cardiovascular:     Rate and Rhythm: Normal rate and regular rhythm.     Heart sounds: Normal heart sounds.  Pulmonary:     Effort: Pulmonary effort is normal.     Breath sounds: Normal breath sounds.  Abdominal:     General: Bowel sounds are normal.     Palpations: Abdomen is soft.     Comments: Abdominal exam shows healing laparoscopy scars.  I think he has 5 scars.  They are well-healed.  There is no erythema.  There is no exudate.  His abdomen is slightly distended.  He has slightly decreased bowel sounds.  There is no guarding.  He has no palpable abdominal mass.  Is no palpable liver or spleen tip.  Musculoskeletal:        General: No tenderness or deformity.  Normal range of motion.     Cervical back: Normal range of motion.  Lymphadenopathy:     Cervical: No cervical adenopathy.  Skin:    General: Skin is warm and dry.     Findings: No erythema or rash.  Neurological:     Mental Status: He is alert and oriented to person, place, and time.  Psychiatric:        Behavior: Behavior normal.        Thought Content: Thought content normal.        Judgment: Judgment normal.      Lab Results  Component Value Date   WBC 5.7 10/07/2020   HGB 15.4 10/07/2020   HCT 44.8 10/07/2020   MCV 97.2 10/07/2020   PLT 347 10/07/2020   Lab Results  Component Value Date   FERRITIN 54 07/08/2020   IRON 79 07/08/2020   TIBC 326 07/08/2020   UIBC 248 07/08/2020   IRONPCTSAT 24 07/08/2020   Lab Results  Component Value Date   RETICCTPCT 1.2 07/08/2020   RBC 4.61 10/07/2020   No results found for: KPAFRELGTCHN, LAMBDASER, KAPLAMBRATIO No results found for: IGGSERUM, IGA, IGMSERUM No results found for: Odetta Pink, SPEI   Chemistry      Component Value Date/Time   NA 139 10/07/2020 0841   K 4.2 10/07/2020 0841   CL 102 10/07/2020 0841   CO2 29 10/07/2020 0841   BUN 23 10/07/2020 0841   CREATININE 1.13 10/07/2020 0841      Component Value Date/Time   CALCIUM 9.6 10/07/2020 0841   ALKPHOS 64 10/07/2020 0841   AST 23 10/07/2020 0841   ALT 17 10/07/2020 0841   BILITOT 0.7 10/07/2020 0841       Impression and Plan: Mr. Shin is a very pleasant 65 yo [name] with Polycythemia vera -  JAK 2 positive; 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  The renal cell carcinoma has been treated with surgery.  Thankfully he did not require a complete nephrectomy.  I am not sure what the issue is with the memory.  We are going to have to look into this.  Again, we will order the MRI.  If there is nothing with metastatic disease, then we will have  to make the referral back to his family doctor to see about testing.  I just hate that he is having this memory problem.  I know this does troubled him.  I would like to see him back in a couple months.   Volanda Napoleon, MD 4/14/20229:40 AM

## 2020-10-08 ENCOUNTER — Telehealth: Payer: Self-pay | Admitting: *Deleted

## 2020-10-08 NOTE — Telephone Encounter (Signed)
Per 10/08/20 los - called patient and gave upcoming appointments - mailed calendar

## 2020-10-11 ENCOUNTER — Other Ambulatory Visit: Payer: Self-pay | Admitting: Family Medicine

## 2020-10-11 DIAGNOSIS — F320 Major depressive disorder, single episode, mild: Secondary | ICD-10-CM

## 2020-10-12 NOTE — Telephone Encounter (Signed)
Ok to refill? Had upcoming appt on 10/29/20

## 2020-10-19 ENCOUNTER — Other Ambulatory Visit: Payer: Self-pay | Admitting: Hematology & Oncology

## 2020-10-29 ENCOUNTER — Ambulatory Visit (INDEPENDENT_AMBULATORY_CARE_PROVIDER_SITE_OTHER): Payer: 59 | Admitting: Family Medicine

## 2020-10-29 ENCOUNTER — Other Ambulatory Visit (INDEPENDENT_AMBULATORY_CARE_PROVIDER_SITE_OTHER): Payer: 59

## 2020-10-29 ENCOUNTER — Encounter: Payer: Self-pay | Admitting: Family Medicine

## 2020-10-29 ENCOUNTER — Other Ambulatory Visit: Payer: Self-pay

## 2020-10-29 ENCOUNTER — Other Ambulatory Visit: Payer: Self-pay | Admitting: Family Medicine

## 2020-10-29 VITALS — BP 108/62 | HR 77 | Temp 98.7°F | Ht 72.0 in | Wt 183.2 lb

## 2020-10-29 DIAGNOSIS — Z114 Encounter for screening for human immunodeficiency virus [HIV]: Secondary | ICD-10-CM

## 2020-10-29 DIAGNOSIS — R739 Hyperglycemia, unspecified: Secondary | ICD-10-CM

## 2020-10-29 DIAGNOSIS — K7460 Unspecified cirrhosis of liver: Secondary | ICD-10-CM | POA: Diagnosis not present

## 2020-10-29 DIAGNOSIS — R413 Other amnesia: Secondary | ICD-10-CM

## 2020-10-29 DIAGNOSIS — Z1159 Encounter for screening for other viral diseases: Secondary | ICD-10-CM | POA: Diagnosis not present

## 2020-10-29 DIAGNOSIS — Z23 Encounter for immunization: Secondary | ICD-10-CM

## 2020-10-29 DIAGNOSIS — Z125 Encounter for screening for malignant neoplasm of prostate: Secondary | ICD-10-CM | POA: Diagnosis not present

## 2020-10-29 DIAGNOSIS — E538 Deficiency of other specified B group vitamins: Secondary | ICD-10-CM

## 2020-10-29 DIAGNOSIS — Z0000 Encounter for general adult medical examination without abnormal findings: Secondary | ICD-10-CM

## 2020-10-29 DIAGNOSIS — E785 Hyperlipidemia, unspecified: Secondary | ICD-10-CM

## 2020-10-29 LAB — CBC
HCT: 44 % (ref 39.0–52.0)
Hemoglobin: 14.8 g/dL (ref 13.0–17.0)
MCHC: 33.7 g/dL (ref 30.0–36.0)
MCV: 99.7 fl (ref 78.0–100.0)
Platelets: 388 10*3/uL (ref 150.0–400.0)
RBC: 4.42 Mil/uL (ref 4.22–5.81)
RDW: 13.4 % (ref 11.5–15.5)
WBC: 5.2 10*3/uL (ref 4.0–10.5)

## 2020-10-29 LAB — COMPREHENSIVE METABOLIC PANEL WITH GFR
ALT: 15 U/L (ref 0–53)
AST: 21 U/L (ref 0–37)
Albumin: 4.4 g/dL (ref 3.5–5.2)
Alkaline Phosphatase: 75 U/L (ref 39–117)
BUN: 20 mg/dL (ref 6–23)
CO2: 28 meq/L (ref 19–32)
Calcium: 9.5 mg/dL (ref 8.4–10.5)
Chloride: 103 meq/L (ref 96–112)
Creatinine, Ser: 1.1 mg/dL (ref 0.40–1.50)
GFR: 71.62 mL/min (ref >60.00)
Glucose, Bld: 140 mg/dL — ABNORMAL HIGH (ref 70–99)
Potassium: 4.2 meq/L (ref 3.5–5.1)
Sodium: 140 meq/L (ref 135–145)
Total Bilirubin: 0.6 mg/dL (ref 0.2–1.2)
Total Protein: 7.4 g/dL (ref 6.0–8.3)

## 2020-10-29 LAB — PSA: PSA: 1.85 ng/mL (ref 0.10–4.00)

## 2020-10-29 LAB — LIPID PANEL
Cholesterol: 214 mg/dL — ABNORMAL HIGH (ref 0–200)
HDL: 39.8 mg/dL (ref >39.00)
LDL Cholesterol: 138 mg/dL — ABNORMAL HIGH (ref 0–99)
NonHDL: 174.12
Total CHOL/HDL Ratio: 5
Triglycerides: 181 mg/dL — ABNORMAL HIGH (ref 0.0–149.0)
VLDL: 36.2 mg/dL (ref 0.0–40.0)

## 2020-10-29 LAB — HEMOGLOBIN A1C: Hgb A1c MFr Bld: 5.8 % (ref 4.6–6.5)

## 2020-10-29 LAB — TSH: TSH: 0.47 u[IU]/mL (ref 0.35–4.50)

## 2020-10-29 LAB — VITAMIN B12: Vitamin B-12: 193 pg/mL — ABNORMAL LOW (ref 211–911)

## 2020-10-29 NOTE — Addendum Note (Signed)
Addended by: Sharon Seller B on: 10/29/2020 09:16 AM   Modules accepted: Orders

## 2020-10-29 NOTE — Patient Instructions (Addendum)
Give [place] 2-3 business days to get the results of your labs back.   Keep the diet clean and stay active.  Please schedule your MRI of the brain. I would like the memory evaluation to be after the results of your MRI is back.   If you do not hear anything about your referral in the next 1-2 weeks, call our office and ask for an update.  Let us know if you need anything.

## 2020-10-29 NOTE — Addendum Note (Signed)
Addended by: Sharon Seller B on: 10/29/2020 09:34 AM   Modules accepted: Orders

## 2020-10-29 NOTE — Progress Notes (Signed)
Chief Complaint  Patient presents with  . Annual Exam    Well Male Thomas Hancock is here for a complete physical.   His last physical was >1 year ago.  He is here with his wife. Current diet: in general, a "healthy" diet.  Current exercise: active at work Weight trend: stable Fatigue out of ordinary? No. Seat belt? Yes.    Health maintenance Shingrix- No Colonoscopy- Yes Tetanus- No HIV- No Hep C- No   Patient has 1-1/2 years of memory difficulties.  He does not forget major events in his life but will forget things he was recently told.  He is concerned it is affecting his job.  He normally watches TV when he is not at work.  He does not read routinely.  Exercise is sparse.  He was referred to psychology for a memory evaluation last year but did not have this completed.  His oncologist ordered an MRI of his brain but he has forgotten to schedule it.  He does have a history of low vitamin B12.  It was thought he may have a component of depression contributing to this and he was placed on Zoloft.  While there was initial improvement in the mood, he does not feel it helped his memory.  Currently he does not feel it is even helping his mood.   Past Medical History:  Diagnosis Date  . Anxiety   . Complication of anesthesia    after anesthesia muscle pain  . Depression   . GERD (gastroesophageal reflux disease)   . 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 Polycythemia vera ([place])   . Polycythemia vera ([place]) 10/16/2019  . 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 Right renal mass   . Vitamin B12 deficiency       Past Surgical History:  Procedure Laterality Date  . COLONOSCOPY WITH ESOPHAGOGASTRODUODENOSCOPY (EGD)  07/2019  . NECK SURGERY    . ROBOTIC ASSITED PARTIAL NEPHRECTOMY Right 10/10/2019   Procedure: XI ROBOTIC ASSITED PARTIAL NEPHRECTOMY;  Surgeon: Ceasar Mons, MD;  Location: [facility];  Service: Urology;  Laterality: Right;    Medications  Current Outpatient  Medications on File Prior to Visit  Medication Sig Dispense Refill  . acetaminophen (TYLENOL) 325 MG tablet Take 650 mg by mouth every 6 (six) hours as needed for moderate pain or headache.     . hydroxyurea (HYDREA) 500 MG capsule TAKE 1 CAPSULE BY MOUTH TWICE DAILY WITH FOOD TO  MINIMIZE  GI  SIDE  EFFECTS 60 capsule 0   Allergies No Known Allergies  Family History Family History  Problem Relation Age of Onset  . Colon cancer Neg Hx   . Esophageal cancer Neg Hx     Review of Systems: Constitutional:  no fevers Eye:  no recent significant change in vision Ear/Nose/Mouth/Throat:  Ears:  no hearing loss Nose/Mouth/Throat:  no complaints of nasal congestion, no sore throat Cardiovascular:  no chest pain Respiratory:  no shortness of breath Gastrointestinal:  no change in bowel habits GU:  Male: negative for dysuria, frequency Musculoskeletal/Extremities:  no joint pain Integumentary (Skin/Breast):  no abnormal skin lesions reported Neurologic:  no headaches Endocrine: No unexpected weight changes Hematologic/Lymphatic:  no abnormal bleeding  Exam BP 108/62 (BP Location: Left Arm, Patient Position: Sitting, Cuff Size: Normal)   Pulse 77   Temp 98.7 F (37.1 C) (Oral)   Ht 6' (1.829 m)   Wt 183 lb 4 oz (83.1 kg)   SpO2 98%   BMI 24.85 kg/m  General:  well developed, well nourished, in no apparent distress Skin:  no significant moles, warts, or growths Head:  no masses, lesions, or tenderness Eyes:  pupils equal and round, sclera anicteric without injection Ears:  canals without lesions, TMs shiny without retraction, no obvious effusion, no erythema Nose:  nares patent, septum midline, mucosa normal Throat/Pharynx:  lips and gingiva without lesion; tongue and uvula midline; non-inflamed pharynx; no exudates or postnasal drainage Neck: neck supple without adenopathy, thyromegaly, or masses Cardiac: RRR, no bruits, no LE edema Lungs:  clear to auscultation, breath sounds  equal bilaterally, no respiratory distress Abdomen: BS+, soft, non-tender, non-distended, no masses or organomegaly noted Rectal: Deferred Musculoskeletal:  symmetrical muscle groups noted without atrophy or deformity Neuro:  gait normal; deep tendon reflexes normal and symmetric Psych: well oriented with normal range of affect and appropriate judgment/insight  Assessment and Plan  Well adult exam - Plan: Lipid panel, CBC, Comprehensive metabolic panel  Cirrhosis of liver without ascites, unspecified hepatic cirrhosis type (HCC)  Screening for malignant neoplasm of prostate - Plan: PSA  Encounter for hepatitis C screening test for low risk patient - Plan: Hepatitis C antibody  Encounter for screening for HIV - Plan: HIV Antibody (routine testing w rflx)  Memory difficulty - Plan: TSH, B12, Ambulatory referral to Neurology   Well 65 y.o. male. Counseled on diet and exercise. Counseled on risks and benefits of prostate cancer screening with PSA. The patient agrees to undergo testing. Shingrix information provided.  Update tetanus shot today. Chronic, uncontrolled.  Refer to neurology for memory evaluation.  Check labs.  I would like him to have his MRI done prior to the memory evaluation. Immunizations, labs, and further orders as above. Follow up in 1 year for physical or as needed. The patient and his spouse voiced understanding and agreement to the plan.  [place], DO 10/29/20 9:13 AM

## 2020-11-01 ENCOUNTER — Encounter: Payer: Self-pay | Admitting: Neurology

## 2020-11-01 LAB — HIV ANTIBODY (ROUTINE TESTING W REFLEX): HIV 1&2 Ab, 4th Generation: NONREACTIVE

## 2020-11-01 LAB — HEPATITIS C ANTIBODY
Hepatitis C Ab: NONREACTIVE
SIGNAL TO CUT-OFF: 0 (ref <1.00)

## 2020-11-03 ENCOUNTER — Encounter (HOSPITAL_COMMUNITY): Payer: Self-pay

## 2020-11-03 ENCOUNTER — Other Ambulatory Visit: Payer: Self-pay

## 2020-11-03 ENCOUNTER — Emergency Department (HOSPITAL_COMMUNITY)
Admission: EM | Admit: 2020-11-03 | Discharge: 2020-11-03 | Disposition: A | Discharge status: Home / Self Care | Payer: 59 | Attending: Emergency Medicine | Admitting: Emergency Medicine

## 2020-11-03 ENCOUNTER — Emergency Department (HOSPITAL_COMMUNITY): Payer: 59

## 2020-11-03 DIAGNOSIS — R5383 Other fatigue: Secondary | ICD-10-CM | POA: Insufficient documentation

## 2020-11-03 DIAGNOSIS — Z79899 Other long term (current) drug therapy: Secondary | ICD-10-CM | POA: Insufficient documentation

## 2020-11-03 DIAGNOSIS — R55 Syncope and collapse: Secondary | ICD-10-CM | POA: Insufficient documentation

## 2020-11-03 DIAGNOSIS — Z87891 Personal history of nicotine dependence: Secondary | ICD-10-CM | POA: Insufficient documentation

## 2020-11-03 DIAGNOSIS — Z85528 Personal history of other malignant neoplasm of kidney: Secondary | ICD-10-CM | POA: Diagnosis not present

## 2020-11-03 LAB — URINALYSIS, ROUTINE W REFLEX MICROSCOPIC
Bilirubin Urine: NEGATIVE
Glucose, UA: NEGATIVE mg/dL
Hgb urine dipstick: NEGATIVE
Ketones, ur: NEGATIVE mg/dL
Leukocytes,Ua: NEGATIVE
Nitrite: NEGATIVE
Protein, ur: NEGATIVE mg/dL
Specific Gravity, Urine: 1.013 (ref 1.005–1.030)
pH: 6 (ref 5.0–8.0)

## 2020-11-03 LAB — COMPREHENSIVE METABOLIC PANEL
ALT: 20 U/L (ref 0–44)
AST: 25 U/L (ref 15–41)
Albumin: 3.8 g/dL (ref 3.5–5.0)
Alkaline Phosphatase: 69 U/L (ref 38–126)
Anion gap: 8 (ref 5–15)
BUN: 23 mg/dL (ref 8–23)
CO2: 27 mmol/L (ref 22–32)
Calcium: 8.9 mg/dL (ref 8.9–10.3)
Chloride: 104 mmol/L (ref 98–111)
Creatinine, Ser: 1.15 mg/dL (ref 0.61–1.24)
GFR, Estimated: >60 mL/min (ref >60)
Glucose, Bld: 127 mg/dL — ABNORMAL HIGH (ref 70–99)
Potassium: 4.3 mmol/L (ref 3.5–5.1)
Sodium: 139 mmol/L (ref 135–145)
Total Bilirubin: 0.5 mg/dL (ref 0.3–1.2)
Total Protein: 6.9 g/dL (ref 6.5–8.1)

## 2020-11-03 LAB — CBC WITH DIFFERENTIAL/PLATELET
Abs Immature Granulocytes: 0.05 10*3/uL (ref 0.00–0.07)
Basophils Absolute: 0.1 10*3/uL (ref 0.0–0.1)
Basophils Relative: 1 %
Eosinophils Absolute: 0.1 10*3/uL (ref 0.0–0.5)
Eosinophils Relative: 1 %
HCT: 44.8 % (ref 39.0–52.0)
Hemoglobin: 15.1 g/dL (ref 13.0–17.0)
Immature Granulocytes: 1 %
Lymphocytes Relative: 9 %
Lymphs Abs: 0.9 10*3/uL (ref 0.7–4.0)
MCH: 33.6 pg (ref 26.0–34.0)
MCHC: 33.7 g/dL (ref 30.0–36.0)
MCV: 99.6 fL (ref 80.0–100.0)
Monocytes Absolute: 0.8 10*3/uL (ref 0.1–1.0)
Monocytes Relative: 7 %
Neutro Abs: 8.7 10*3/uL — ABNORMAL HIGH (ref 1.7–7.7)
Neutrophils Relative %: 81 %
Platelets: 238 10*3/uL (ref 150–400)
RBC: 4.5 MIL/uL (ref 4.22–5.81)
RDW: 12.3 % (ref 11.5–15.5)
WBC: 10.5 10*3/uL (ref 4.0–10.5)
nRBC: 0 % (ref 0.0–0.2)

## 2020-11-03 LAB — CBG MONITORING, ED: Glucose-Capillary: 114 mg/dL — ABNORMAL HIGH (ref 70–99)

## 2020-11-03 MED ORDER — SODIUM CHLORIDE 0.9 % IV BOLUS
1000.0000 mL | Freq: Once | INTRAVENOUS | Status: AC
  Administered 2020-11-03: 1000 mL via INTRAVENOUS

## 2020-11-03 NOTE — ED Provider Notes (Signed)
[facility] EMERGENCY DEPARTMENT Provider Note   CSN: [id number] Arrival date & time: 11/03/20  1830     History Chief Complaint  Patient presents with  . Loss of Consciousness    Thomas Hancock is a 65 y.o. male.  HPI   This patient is a 65 year old male with a history of a right renal cell carcinoma status post resection, history of cirrhosis, history of hemochromatosis and a history of polycythemia vera.  The patient takes a couple of different medications including hydroxyurea, states that he takes a blood thinner but cannot tell me the name, he does phlebotomy as indicated to maintain a low ferritin and low iron saturation.  He takes a baby aspirin once a day.  The surgery was performed in 2021, he had a robotic assisted laparoscopic right partial nephrectomy.  The patient reports to me that today he was in his usual state of health, he states that he usually does not do anything athletic and never does any yard work, he had gone outside to help stand up one of the trees, he was sweating, hot, working more than he usually does when he started to feel very lightheaded and ultimately passed out.  Paramedics were called to the scene and found his blood sugar to be 118, they found that his blood pressure was low around 100 and his heart rate was in the 50s, he gradually came around and was back to his normal self prior to the paramedics arriving.  He states that he did eat today, he has not had very much to drink, he denies any chest pain shortness of breath palpitations swelling diarrhea nausea vomiting fevers chills coughing or shortness of breath.  At this time the patient states he feels back to his normal self other than feeling fatigued and tired.  I viewed the paramedics prehospital cardiac tracings which showed mild bradycardia but no other arrhythmias or ischemic findings  I have reviewed the patient's medical record showing the history of his renal cell  carcinoma as well as a history of his hematologic disorders.    Past Medical History:  Diagnosis Date  . Anxiety   . Complication of anesthesia    after anesthesia muscle pain  . Depression   . GERD (gastroesophageal reflux disease)   . 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 Polycythemia vera ([place])   . 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 Right renal mass   . Vitamin B12 deficiency     Patient Active Problem List   Diagnosis Date Noted  . Cirrhosis of liver without ascites, unspecified hepatic cirrhosis type ([place]) 10/29/2020  . 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 COLONOSCOPY WITH ESOPHAGOGASTRODUODENOSCOPY (EGD)  07/2019  . NECK SURGERY    . ROBOTIC ASSITED PARTIAL NEPHRECTOMY Right 10/10/2019   Procedure: XI ROBOTIC ASSITED PARTIAL NEPHRECTOMY;  Surgeon: Ceasar Mons, MD;  Location: [facility];  Service: Urology;  Laterality: Right;       Family History  Problem Relation Age of Onset  . Colon cancer Neg Hx   . Esophageal cancer Neg Hx     Social History   Tobacco Use  . Smoking status: Former Smoker    Types: Cigarettes  . Smokeless tobacco: Never Used  . Tobacco comment: quit over 20 years ago  Vaping Use  . Vaping Use: Never used  Substance Use Topics  . Alcohol use: Never  . Drug use: Never    Home Medications Prior to Admission medications   Medication Sig Start Date End Date Taking? Authorizing Provider  acetaminophen (TYLENOL) 325 MG tablet Take 650 mg by mouth every 6 (six) hours as needed for moderate pain or headache.     [provider]  hydroxyurea (HYDREA) 500 MG capsule TAKE 1 CAPSULE BY MOUTH TWICE DAILY WITH FOOD TO  MINIMIZE  GI  SIDE  EFFECTS 10/19/20   Volanda Napoleon, MD    Allergies    Patient has no known allergies.  Review of Systems   Review of  Systems  All other systems reviewed and are negative.   Physical Exam Updated Vital Signs BP 119/85   Pulse 69   Temp 97.7 F (36.5 C) (Oral)   Resp 12   Ht 1.829 m (6')   Wt 81.6 kg   SpO2 99%   BMI 24.41 kg/m   Physical Exam Vitals and nursing note reviewed.  Constitutional:      General: He is not in acute distress.    Appearance: He is well-developed.  HENT:     Head: Normocephalic and atraumatic.     Mouth/Throat:     Mouth: Mucous membranes are dry.     Pharynx: No oropharyngeal exudate.  Eyes:     General: No scleral icterus.       Right eye: No discharge.        Left eye: No discharge.     Conjunctiva/sclera: Conjunctivae normal.     Pupils: Pupils are equal, round, and reactive to light.  Neck:     Thyroid: No thyromegaly.     Vascular: No JVD.  Cardiovascular:     Rate and Rhythm: Normal rate and regular rhythm.     Heart sounds: Normal heart sounds. No murmur heard. No friction rub. No gallop.      Comments: Heart rate is between 60 and 65, normal pulses, no JVD, no edema Pulmonary:     Effort: Pulmonary effort is normal. No respiratory distress.     Breath sounds: Normal breath sounds. No wheezing or rales.  Abdominal:     General: Bowel sounds are normal. There is no distension.     Palpations: Abdomen is soft. There is no mass.     Tenderness: There is no abdominal tenderness.  Musculoskeletal:        General: No tenderness. Normal range of motion.     Cervical back: Normal range of motion and neck supple.  Lymphadenopathy:     Cervical: No cervical adenopathy.  Skin:    General: Skin is warm and dry.     Findings: No erythema or rash.  Neurological:     Mental Status: He is alert.     Coordination: Coordination normal.     Comments: The patient is able to move all 4 extremities, he appears well, awake, alert, able to perform all activities that I requested.  His speech is clear, memory seems to be intact  Psychiatric:        Behavior:  Behavior normal.     ED Results / Procedures / Treatments   Labs (all labs ordered are listed, but only abnormal results are displayed) Labs Reviewed  CBC WITH DIFFERENTIAL/PLATELET - Abnormal; Notable for the following components:      Result Value   Neutro Abs 8.7 (*)    All other components within normal limits  COMPREHENSIVE METABOLIC PANEL -  Abnormal; Notable for the following components:   Glucose, Bld 127 (*)    All other components within normal limits  CBG MONITORING, ED - Abnormal; Notable for the following components:   Glucose-Capillary 114 (*)    All other components within normal limits  URINALYSIS, ROUTINE W REFLEX MICROSCOPIC    EKG EKG Interpretation  Date/Time:  Wednesday Nov 03 2020 18:50:38 EDT Ventricular Rate:  60 PR Interval:  187 QRS Duration: 96 QT Interval:  466 QTC Calculation: 466 R Axis:   37 Text Interpretation: Sinus rhythm no ST elevation no LVH normal axis normal intervals No old tracing to compare Confirmed by Noemi Chapel [phone]) on 11/03/2020 6:52:59 PM   Radiology CT Head Wo Contrast  Result Date: 11/03/2020 CLINICAL DATA:  Syncope EXAM: CT HEAD WITHOUT CONTRAST TECHNIQUE: Contiguous axial images were obtained from the base of the skull through the vertex without intravenous contrast. COMPARISON:  None. FINDINGS: Brain: No acute territorial infarction, hemorrhage or intracranial mass. Mild atrophy. Chronic appearing lacunar infarct within the right caudate. Nonenlarged ventricles. Vascular: No hyperdense vessels.  No unexpected calcification Skull: Normal. Negative for fracture or focal lesion. Sinuses/Orbits: Mild mucosal thickening in the sinuses Other: None IMPRESSION: 1. No CT evidence for acute intracranial abnormality. 2. 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   On: 11/03/2020 21:37    Procedures Procedures   Medications Ordered in ED Medications  sodium chloride 0.9  % bolus 1,000 mL (0 mLs Intravenous Stopped 11/03/20 2140)    ED Course  I have reviewed the triage vital signs and the nursing notes.  Pertinent labs & imaging results that were available during my care of the patient were reviewed by me and considered in my medical decision making (see chart for details).    MDM Rules/Calculators/A&P                          I will obtain a formal EKG, the patient will need some lab work, he does not appear ill has no focal neurologic deficits or cardiac complaints.  Likely syncope related to dehydration heat and exhaustion  Laboratory work-up shows no leukocytosis, no anemia, no signs of urinary tract infection or severe dehydration, no ketosis or ketonuria, comprehensive metabolic panel without renal dysfunction uremia or electrolyte abnormalities, CT scan of the brain without signs of tumors or infarcts or [place] other than a chronic appearing lacunar infarct in the right caudate.  The patient does not have any orthostatic changes in vital signs, has had no arrhythmias on the monitor and at this time appears very well, asymptomatic and stable for discharge.  I discussed this with the family member at the bedside, they are in agreement to follow-up in the outpatient setting  Final Clinical Impression(s) / ED Diagnoses Final diagnoses:  None    Rx / DC Orders ED Discharge Orders    None       Noemi Chapel, MD 11/03/20 2217

## 2020-11-03 NOTE — ED Triage Notes (Signed)
Was tieing up a tree felt faint and then had syncopal episode witnessed pt diaphoretic and pale brady on ems arrival currently alert and oriented

## 2020-11-03 NOTE — ED Notes (Signed)
Family updated.

## 2020-11-03 NOTE — ED Notes (Signed)
Pt ambulated to the restroom without assistance, steady gait noted. No complaints.

## 2020-11-03 NOTE — ED Notes (Signed)
Daughter Thomas Hancock [phone] would like an update

## 2020-11-03 NOTE — Discharge Instructions (Signed)
Your testing today reveals no signs of heart attack, irregular heart rhythms or abnormal blood work.  The CT scan shows no signs of tumors strokes or aneurysms.  I would encourage you to follow-up closely with your doctor this week but return to the emergency department for severe or worsening symptoms

## 2020-11-12 ENCOUNTER — Encounter: Payer: Self-pay | Admitting: Family Medicine

## 2020-11-12 ENCOUNTER — Ambulatory Visit (INDEPENDENT_AMBULATORY_CARE_PROVIDER_SITE_OTHER): Payer: 59 | Admitting: Family Medicine

## 2020-11-12 ENCOUNTER — Other Ambulatory Visit: Payer: Self-pay

## 2020-11-12 VITALS — BP 108/64 | HR 78 | Temp 97.7°F | Resp 16 | Ht 72.0 in | Wt 186.0 lb

## 2020-11-12 DIAGNOSIS — R55 Syncope and collapse: Secondary | ICD-10-CM

## 2020-11-12 NOTE — Patient Instructions (Addendum)
If you do not hear anything about your referral in the next 1-2 weeks, call our office and ask for an update.  Avoid strenuous exercise until you see cardiology.  Stay hydrated.  Keep eating healthy.  Let us know if you need anything.

## 2020-11-12 NOTE — Progress Notes (Signed)
Chief Complaint  Patient presents with  . Follow-up    Syncope    Subjective: Patient is a 65 y.o. male here for passing out. Here w spouse.   5/11 he was doing yardwork and lost consciousness when he rose to a standing position. He was sweating. Sugar was 118, SBP in low 100's, pulse in 50's. He was eating and drinking normally leading up to this. No CP, SOB, palpitations. He did not shake, bite his tongue, or lose control of his bowel/bladder function. Rarely he will get lightheaded when he stands up. He has never passed out before. He felt sweaty/flushed prior to this happening. It was not particularly hot when this happened.   Past Medical History:  Diagnosis Date  . Anxiety   . Complication of anesthesia    after anesthesia muscle pain  . Depression   . GERD (gastroesophageal reflux disease)   . 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 Polycythemia vera ([place])   . Polycythemia vera ([place]) 10/16/2019  . 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 Right renal mass   . Vitamin B12 deficiency     Objective: BP 108/64 (BP Location: Right Arm, Patient Position: Sitting, Cuff Size: Normal)   Pulse 78   Temp 97.7 F (36.5 C)   Resp 16   Ht 6' (1.829 m)   Wt 186 lb (84.4 kg)   SpO2 98%   BMI 25.23 kg/m  General: Awake, appears stated age HEENT: MMM, EOMi Heart: RRR, no murmurs, no bruits, no LE edema Lungs: CTAB, no rales, wheezes or rhonchi. No accessory muscle use Psych: Age appropriate judgment and insight, normal affect and mood  Assessment and Plan: Syncope, unspecified syncope type - Plan: Ambulatory referral to Cardiology  ER visit reviewed. Refer cards. Likely vasovagal. Doubt neuro etiology. F/u as originally scheduled. The patient and his spouse voiced understanding and agreement to the plan.  Greater than 30 minutes were spent with the patient in addition to reviewing their chart information on the same day of the visit.   [place], DO 11/12/20  4:27  PM

## 2020-12-07 ENCOUNTER — Other Ambulatory Visit: Payer: Self-pay

## 2020-12-07 ENCOUNTER — Encounter: Payer: Self-pay | Admitting: *Deleted

## 2020-12-07 ENCOUNTER — Encounter: Payer: Self-pay | Admitting: Hematology & Oncology

## 2020-12-07 ENCOUNTER — Telehealth: Payer: Self-pay | Admitting: *Deleted

## 2020-12-07 ENCOUNTER — Inpatient Hospital Stay (HOSPITAL_BASED_OUTPATIENT_CLINIC_OR_DEPARTMENT_OTHER): Payer: 59 | Admitting: Hematology & Oncology

## 2020-12-07 ENCOUNTER — Inpatient Hospital Stay: Payer: 59 | Attending: Family

## 2020-12-07 VITALS — BP 125/78 | HR 72 | Temp 98.3°F | Resp 18 | Wt 190.0 lb

## 2020-12-07 DIAGNOSIS — Z85528 Personal history of other malignant neoplasm of kidney: Secondary | ICD-10-CM | POA: Insufficient documentation

## 2020-12-07 DIAGNOSIS — C641 Malignant neoplasm of right kidney, except renal pelvis: Secondary | ICD-10-CM

## 2020-12-07 DIAGNOSIS — D45 Polycythemia vera: Secondary | ICD-10-CM | POA: Diagnosis not present

## 2020-12-07 LAB — CBC WITH DIFFERENTIAL (CANCER CENTER ONLY)
Abs Immature Granulocytes: 0.02 10*3/uL (ref 0.00–0.07)
Basophils Absolute: 0.1 10*3/uL (ref 0.0–0.1)
Basophils Relative: 1 %
Eosinophils Absolute: 0.1 10*3/uL (ref 0.0–0.5)
Eosinophils Relative: 3 %
HCT: 42.2 % (ref 39.0–52.0)
Hemoglobin: 14.8 g/dL (ref 13.0–17.0)
Immature Granulocytes: 0 %
Lymphocytes Relative: 28 %
Lymphs Abs: 1.4 10*3/uL (ref 0.7–4.0)
MCH: 34.3 pg — ABNORMAL HIGH (ref 26.0–34.0)
MCHC: 35.1 g/dL (ref 30.0–36.0)
MCV: 97.9 fL (ref 80.0–100.0)
Monocytes Absolute: 0.5 10*3/uL (ref 0.1–1.0)
Monocytes Relative: 9 %
Neutro Abs: 3 10*3/uL (ref 1.7–7.7)
Neutrophils Relative %: 59 %
Platelet Count: 363 10*3/uL (ref 150–400)
RBC: 4.31 MIL/uL (ref 4.22–5.81)
RDW: 12.3 % (ref 11.5–15.5)
WBC Count: 5.2 10*3/uL (ref 4.0–10.5)
nRBC: 0 % (ref 0.0–0.2)

## 2020-12-07 LAB — FERRITIN: Ferritin: 48 ng/mL (ref 24–336)

## 2020-12-07 LAB — CMP (CANCER CENTER ONLY)
ALT: 13 U/L (ref 0–44)
AST: 19 U/L (ref 15–41)
Albumin: 4.3 g/dL (ref 3.5–5.0)
Alkaline Phosphatase: 67 U/L (ref 38–126)
Anion gap: 9 (ref 5–15)
BUN: 16 mg/dL (ref 8–23)
CO2: 27 mmol/L (ref 22–32)
Calcium: 9.5 mg/dL (ref 8.9–10.3)
Chloride: 103 mmol/L (ref 98–111)
Creatinine: 1.06 mg/dL (ref 0.61–1.24)
GFR, Estimated: >60 mL/min (ref >60)
Glucose, Bld: 154 mg/dL — ABNORMAL HIGH (ref 70–99)
Potassium: 4.2 mmol/L (ref 3.5–5.1)
Sodium: 139 mmol/L (ref 135–145)
Total Bilirubin: 0.7 mg/dL (ref 0.3–1.2)
Total Protein: 7 g/dL (ref 6.5–8.1)

## 2020-12-07 LAB — IRON AND TIBC
Iron: 92 ug/dL (ref 42–163)
Saturation Ratios: 28 % (ref 20–55)
TIBC: 328 ug/dL (ref 202–409)
UIBC: 236 ug/dL (ref 117–376)

## 2020-12-07 LAB — LACTATE DEHYDROGENASE: LDH: 143 U/L (ref 98–192)

## 2020-12-07 NOTE — Progress Notes (Signed)
Hematology and Oncology Follow Up Visit  Thomas Hancock [id number] 1955-11-06 65 y.o. 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  Seems like he is having some more issues with respect to his memory.  I am not sure if he has had seen a neurologist.  I think needs to see 1 soon.  He apparently had an episode of syncope about a month ago.  He was in the backyard.  He bent over and then when he got up he just passed out.  He went to the emergency room.  He had a CT scan of the brain which was unremarkable.  He was have found to have some low blood pressure.  He has been seeing his family doctor.  He is going be seeing cardiology.  I think his biggest concern is the memory issues.  He seems to be more forgetful.  Apparently, he has an older brother who has the same problem.  As far as the polycythemia is concerned, this is doing well.  His blood counts have been under very good control.  He is just on 500 mg of Hydrea daily.  He is also on baby aspirin.  Hemochromatosis has not been a problem for him.  When we last checked his iron studies back in April, his ferritin was 75 with an iron saturation of 26%.  He has had no fever.  There has been no issues with COVID.  He has had no change in bowel or bladder habits.  He has had no issues with incontinence.  Overall, his performance status is ECOG 1.    Medications:  Allergies as of 12/07/2020   No Known Allergies      Medication List        Accurate as of December 07, 2020  8:17 AM. If you have any questions, ask your nurse or doctor.           hydroxyurea 500 MG capsule Commonly known as: HYDREA TAKE 1 CAPSULE BY MOUTH TWICE DAILY WITH FOOD TO  MINIMIZE  GI  SIDE  EFFECTS        Allergies: No Known Allergies  Past Medical History, Surgical history, Social history, and Family History were reviewed and updated.  Review of Systems: Review of Systems  Constitutional: Negative.   HENT: Negative.    Eyes: Negative.   Respiratory: Negative.    Cardiovascular: Negative.   Gastrointestinal:  Positive for abdominal pain.  Genitourinary: Negative.   Musculoskeletal: Negative.   Skin: Negative.   Neurological: Negative.   Endo/Heme/Allergies: Negative.   Psychiatric/Behavioral: Negative.      Physical Exam:  weight is 190 lb (86.2 kg). His oral temperature is 98.3 F (36.8 C). His blood pressure is 125/78 and his pulse is 72. His respiration is 18 and oxygen saturation is 100%.   Wt Readings from Last 3 Encounters:  12/07/20 190 lb (86.2 kg)  11/12/20 186 lb (84.4 kg)  11/03/20 180 lb (81.6 kg)    Physical Exam Vitals reviewed.  HENT:     Head: Normocephalic and atraumatic.  Eyes:  Pupils: Pupils are equal, round, and reactive to light.  Cardiovascular:     Rate and Rhythm: Normal rate and regular rhythm.     Heart sounds: Normal heart sounds.  Pulmonary:     Effort: Pulmonary effort is normal.     Breath sounds: Normal breath sounds.  Abdominal:     General: Bowel sounds are normal.     Palpations: Abdomen is soft.     Comments: Abdominal exam shows healing laparoscopy scars.  I think he has 5 scars.  They are well-healed.  There is no erythema.  There is no exudate.  His abdomen is slightly distended.  He has slightly decreased bowel sounds.  There is no guarding.  He has no palpable abdominal mass.  Is no palpable liver or spleen tip.  Musculoskeletal:        General: No tenderness or deformity. Normal range of motion.     Cervical back: Normal range of motion.  Lymphadenopathy:     Cervical: No  cervical adenopathy.  Skin:    General: Skin is warm and dry.     Findings: No erythema or rash.  Neurological:     Mental Status: He is alert and oriented to person, place, and time.  Psychiatric:        Behavior: Behavior normal.        Thought Content: Thought content normal.        Judgment: Judgment normal.     Lab Results  Component Value Date   WBC 5.2 12/07/2020   HGB 14.8 12/07/2020   HCT 42.2 12/07/2020   MCV 97.9 12/07/2020   PLT 363 12/07/2020   Lab Results  Component Value Date   FERRITIN 75 10/07/2020   IRON 87 10/07/2020   TIBC 338 10/07/2020   UIBC 251 10/07/2020   IRONPCTSAT 26 10/07/2020   Lab Results  Component Value Date   RETICCTPCT 1.2 07/08/2020   RBC 4.31 12/07/2020   No results found for: KPAFRELGTCHN, LAMBDASER, KAPLAMBRATIO No results found for: IGGSERUM, IGA, IGMSERUM No results found for: Odetta Pink, SPEI   Chemistry      Component Value Date/Time   NA 139 11/03/2020 1958   K 4.3 11/03/2020 1958   CL 104 11/03/2020 1958   CO2 27 11/03/2020 1958   BUN 23 11/03/2020 1958   CREATININE 1.15 11/03/2020 1958   CREATININE 1.13 10/07/2020 0841      Component Value Date/Time   CALCIUM 8.9 11/03/2020 1958   ALKPHOS 69 11/03/2020 1958   AST 25 11/03/2020 1958   AST 23 10/07/2020 0841   ALT 20 11/03/2020 1958   ALT 17 10/07/2020 0841   BILITOT 0.5 11/03/2020 1958   BILITOT 0.7 10/07/2020 0841       Impression and Plan: Thomas Hancock is a very pleasant 65 yo [name] with Polycythemia vera -  JAK 2 positive; 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  The renal cell carcinoma has been treated with surgery.  Thankfully he did not require a complete nephrectomy.  He is being followed by urology for this.  Again I am not sure exactly why the syncope.  His blood pressure is fine today.  His heart rate is not slow.  We will have to see  if cardiology wants to put him on a Holter monitor.  Neurology will deal with the memory issues.  I cannot imagine that this is related to anything with respect to  his blood.  His polycythemia is under very good control.  His hemochromatosis is under very good control.  We will plan to try to get him through the summer now.  I will plan to see him back after Labor Day.    Volanda Napoleon, MD 6/14/20228:17 AM

## 2020-12-07 NOTE — Telephone Encounter (Signed)
-----   Message from Volanda Napoleon, MD sent at 12/07/2020  8:55 AM EDT ----- Call - the chemistry labs are ok!!!  The blood sugar is a little high, so watch the sugar intake!!  Laurey Arrow

## 2020-12-09 ENCOUNTER — Other Ambulatory Visit: Payer: Self-pay | Admitting: Family Medicine

## 2020-12-09 ENCOUNTER — Other Ambulatory Visit (INDEPENDENT_AMBULATORY_CARE_PROVIDER_SITE_OTHER): Payer: 59

## 2020-12-09 ENCOUNTER — Other Ambulatory Visit: Payer: Self-pay

## 2020-12-09 DIAGNOSIS — E785 Hyperlipidemia, unspecified: Secondary | ICD-10-CM

## 2020-12-09 DIAGNOSIS — E538 Deficiency of other specified B group vitamins: Secondary | ICD-10-CM | POA: Diagnosis not present

## 2020-12-09 LAB — VITAMIN B12: Vitamin B-12: 590 pg/mL (ref 211–911)

## 2020-12-09 LAB — LIPID PANEL
Cholesterol: 232 mg/dL — ABNORMAL HIGH (ref 0–200)
HDL: 35.8 mg/dL — ABNORMAL LOW (ref >39.00)
NonHDL: 196.69
Total CHOL/HDL Ratio: 6
Triglycerides: 224 mg/dL — ABNORMAL HIGH (ref 0.0–149.0)
VLDL: 44.8 mg/dL — ABNORMAL HIGH (ref 0.0–40.0)

## 2020-12-09 LAB — LDL CHOLESTEROL, DIRECT: Direct LDL: 121 mg/dL

## 2020-12-15 DIAGNOSIS — F329 Major depressive disorder, single episode, unspecified: Secondary | ICD-10-CM

## 2020-12-15 DIAGNOSIS — K219 Gastro-esophageal reflux disease without esophagitis: Secondary | ICD-10-CM | POA: Insufficient documentation

## 2020-12-15 DIAGNOSIS — F411 Generalized anxiety disorder: Secondary | ICD-10-CM | POA: Insufficient documentation

## 2020-12-15 DIAGNOSIS — N2889 Other specified disorders of kidney and ureter: Secondary | ICD-10-CM | POA: Insufficient documentation

## 2020-12-15 DIAGNOSIS — E538 Deficiency of other specified B group vitamins: Secondary | ICD-10-CM | POA: Insufficient documentation

## 2020-12-15 DIAGNOSIS — T8859XA Other complications of anesthesia, initial encounter: Secondary | ICD-10-CM | POA: Insufficient documentation

## 2020-12-15 HISTORY — DX: Generalized anxiety disorder: F41.1

## 2020-12-15 HISTORY — DX: Major depressive disorder, single episode, unspecified: F32.9

## 2020-12-16 ENCOUNTER — Ambulatory Visit (INDEPENDENT_AMBULATORY_CARE_PROVIDER_SITE_OTHER): Payer: 59 | Admitting: Cardiology

## 2020-12-16 ENCOUNTER — Ambulatory Visit: Payer: 59

## 2020-12-16 ENCOUNTER — Encounter: Payer: Self-pay | Admitting: Cardiology

## 2020-12-16 ENCOUNTER — Other Ambulatory Visit: Payer: Self-pay

## 2020-12-16 DIAGNOSIS — R55 Syncope and collapse: Secondary | ICD-10-CM

## 2020-12-16 DIAGNOSIS — D45 Polycythemia vera: Secondary | ICD-10-CM

## 2020-12-16 DIAGNOSIS — N2889 Other specified disorders of kidney and ureter: Secondary | ICD-10-CM

## 2020-12-16 HISTORY — DX: Syncope and collapse: R55

## 2020-12-16 NOTE — Patient Instructions (Signed)
Medication Instructions:  Your physician recommends that you continue on your current medications as directed. Please refer to the Current Medication list given to you today.  *If you need a refill on your cardiac medications before your next appointment, please call your pharmacy*   Lab Work: None If you have labs (blood work) drawn today and your tests are completely normal, you will receive your results only by: [place] (if you have MyChart) OR A paper copy in the mail If you have any lab test that is abnormal or we need to change your treatment, we will call you to review the results.   Testing/Procedures: A zio monitor was ordered today. It will remain on for 7 days. You will then return monitor and event diary in provided box. It takes 1-2 weeks for report to be downloaded and returned to [place]. We will call you with the results. If monitor falls off or has orange flashing light, please call Zio for further instructions.     Follow-Up: At [facility], you and your health needs are our priority.  As part of our continuing mission to provide you with exceptional heart care, we have created designated Provider Care Teams.  These Care Teams include your primary Cardiologist (physician) and Advanced Practice Providers (APPs -  Physician Assistants and Nurse Practitioners) who all work together to provide you with the care you need, when you need it.  We recommend signing up for the patient portal called "MyChart".  Sign up information is provided on this After Visit Summary.  MyChart is used to connect with patients for Virtual Visits (Telemedicine).  Patients are able to view lab/test results, encounter notes, upcoming appointments, etc.  Non-urgent messages can be sent to your provider as well.   To learn more about what you can do with MyChart, go to [email or web address].    Your next appointment:   2 month(s)  The format for your next appointment:   In Person  Provider:    Jenne Campus, MD   Other Instructions  Echocardiogram An echocardiogram is a test that uses sound waves (ultrasound) to produce images of the heart. Images from an echocardiogram can provide important information about: Heart size and shape. The size and thickness and movement of your heart's walls. Heart muscle function and strength. Heart valve function or if you have stenosis. Stenosis is when the heart valves are too narrow. If blood is flowing backward through the heart valves (regurgitation). A tumor or infectious growth around the heart valves. Areas of heart muscle that are not working well because of poor blood flow or injury from a heart attack. Aneurysm detection. An aneurysm is a weak or damaged part of an artery wall. The wall bulges out from the normal force of blood pumping through the body. Tell a health care provider about: Any allergies you have. All medicines you are taking, including vitamins, herbs, eye drops, creams, and over-the-counter medicines. Any blood disorders you have. Any surgeries you have had. Any medical conditions you have. Whether you are pregnant or may be pregnant. What are the risks? Generally, this is a safe test. However, problems may occur, including an allergic reaction to dye (contrast) that may be used during the test. What happens before the test? No specific preparation is needed. You may eat and drink normally. What happens during the test?  You will take off your clothes from the waist up and put on a hospital gown. Electrodes or electrocardiogram (ECG)patches may be placed on  your chest. The electrodes or patches are then connected to a device that monitors your heart rate and rhythm. You will lie down on a table for an ultrasound exam. A gel will be applied to your chest to help sound waves pass through your skin. A handheld device, called a transducer, will be pressed against your chest and moved over your heart. The  transducer produces sound waves that travel to your heart and bounce back (or "echo" back) to the transducer. These sound waves will be captured in real-time and changed into images of your heart that can be viewed on a video monitor. The images will be recorded on a computer and reviewed by your health care provider. You may be asked to change positions or hold your breath for a short time. This makes it easier to get different views or better views of your heart. In some cases, you may receive contrast through an IV in one of your veins. This can improve the quality of the pictures from your heart. The procedure may vary among health care providers and hospitals. What can I expect after the test? You may return to your normal, everyday life, including diet, activities, andmedicines, unless your health care provider tells you not to do that. Follow these instructions at home: It is up to you to get the results of your test. Ask your health care provider, or the department that is doing the test, when your results will be ready. Keep all follow-up visits. This is important. Summary An echocardiogram is a test that uses sound waves (ultrasound) to produce images of the heart. Images from an echocardiogram can provide important information about the size and shape of your heart, heart muscle function, heart valve function, and other possible heart problems. You do not need to do anything to prepare before this test. You may eat and drink normally. After the echocardiogram is completed, you may return to your normal, everyday life, unless your health care provider tells you not to do that. This information is not intended to replace advice given to you by your health care provider. Make sure you discuss any questions you have with your healthcare provider. Document Revised: 02/03/2020 Document Reviewed: 02/03/2020 Elsevier Patient Education  2022 [employer].

## 2020-12-16 NOTE — Progress Notes (Signed)
Cardiology Consultation:    Date:  12/16/2020   ID:  Renee Beale, DOB 30-Jun-1955, MRN [id number]  PCP:  Shelda Pal, DO  Cardiologist:  Jenne Campus, MD   Referring MD: Shelda Pal*   Chief Complaint  Patient presents with   Check upo per Dr. Barkley Boards request PCP    Dizzy(fall) 11/03/20) Seen at [facility]    History of Present Illness:    Demyan Fugate is a 65 y.o. male who is being seen today for the evaluation of syncope at the request of Shelda Pal*.  With past medical history significant for renal cell carcinoma, status postresection done in October 09, 2019, hemochromatosis, polycythemia vera.  She was referred to [place] because of episode of syncope.  Apparently he was working outside he was about 4:30 PM he had normal rectal day he ate he drink normally.  It was not particularly hot.  He was trying to fix some 3 he his wife was helping him and then he said he does not feel well he said he started feeling dizzy he also started holding his epigastrium then he sat down on the chair and ended up having brief episode of syncope.  He is wife run to home to get some water however when she came she could not get him up.  When she decided to call 911.  EMS came, sugar was normal, blood pressure was low 500 systolic, heart rate was only 50.  He was rehydrated he was told reason for his syncope was dehydration he was sent home.  He said he never had episode like this before.  He admits that he does not do much physical work and what was she was doing the day was kind of unusual.  Denies have any chest pain tightness squeezing pressure burning chest no swelling of lower extremities.  He snore some but does not have episode of apnea during the night according to his wife who is with him in the room He quit smoking more than 25 years ago He does not do any exercises He does not use any special diet  Past Medical History:  Diagnosis Date   Anxiety     Complication of anesthesia    after anesthesia muscle pain   Depression    GERD (gastroesophageal reflux disease)    Hemochromatosis 10/16/2019   Polycythemia vera ([place])    Polycythemia vera ([place]) 10/16/2019   Renal cell cancer, right ([place]) 10/16/2019   Right renal mass    Vitamin B12 deficiency     Past Surgical History:  Procedure Laterality Date   COLONOSCOPY WITH ESOPHAGOGASTRODUODENOSCOPY (EGD)  07/2019   NECK SURGERY     ROBOTIC ASSITED PARTIAL NEPHRECTOMY Right 10/10/2019   Procedure: XI ROBOTIC ASSITED PARTIAL NEPHRECTOMY;  Surgeon: Ceasar Mons, MD;  Location: [facility];  Service: Urology;  Laterality: Right;    Current Medications: Current Meds  Medication Sig   ASPIRIN 81 PO Take 1 tablet by mouth daily.   Cyanocobalamin (B-12 PO) Take 1 tablet by mouth daily.   hydroxyurea (HYDREA) 500 MG capsule TAKE 1 CAPSULE BY MOUTH TWICE DAILY WITH FOOD TO  MINIMIZE  GI  SIDE  EFFECTS (Patient taking differently: Take 500 mg by mouth 2 (two) times daily.)     Allergies:   Patient has no known allergies.   Social History   Socioeconomic History   Marital status: Married    Spouse name: Not on file   Number of children: 2  Years of education: Not on file   Highest education level: Not on file  Occupational History   Occupation: Sales  Tobacco Use   Smoking status: Former    Pack years: 0.00    Types: Cigarettes   Smokeless tobacco: Never   Tobacco comments:    quit over 20 years ago  Vaping Use   Vaping Use: Never used  Substance and Sexual Activity   Alcohol use: Never   Drug use: Never   Sexual activity: Not on file  Other Topics Concern   Not on file  Social History Narrative   Not on file   Social Determinants of Health   Financial Resource Strain: Not on file  Food Insecurity: Not on file  Transportation Needs: Not on file  Physical Activity: Not on file  Stress: Not on file  Social Connections: Not on file     Family History: The  patient's family history is negative for Colon cancer and Esophageal cancer. ROS:   Please see the history of present illness.    All 14 point review of systems negative except as described per history of present illness.  EKGs/Labs/Other Studies Reviewed:    The following studies were reviewed today: Record from the emergency room reviewed,  EKG:  EKG is  ordered today.  The ekg ordered today demonstrates normal sinus rhythm, normal P interval, normal QS complex duration [place], nonspecific ST segment changes  Recent Labs: 10/29/2020: TSH 0.47 12/07/2020: ALT 13; BUN 16; Creatinine 1.06; Hemoglobin 14.8; Platelet Count 363; Potassium 4.2; Sodium 139  Recent Lipid Panel    Component Value Date/Time   CHOL 232 (H) 12/09/2020 0802   TRIG 224.0 (H) 12/09/2020 0802   HDL 35.80 (L) 12/09/2020 0802   CHOLHDL 6 12/09/2020 0802   VLDL 44.8 (H) 12/09/2020 0802   LDLCALC 138 (H) 10/29/2020 0916   LDLDIRECT 121.0 12/09/2020 0802    Physical Exam:    VS:  BP 110/72 (BP Location: Right Arm, Patient Position: Sitting)   Pulse 77   Ht 6' (1.829 m)   Wt 187 lb (84.8 kg)   SpO2 95%   BMI 25.36 kg/m     Wt Readings from Last 3 Encounters:  12/16/20 187 lb (84.8 kg)  12/07/20 190 lb (86.2 kg)  11/12/20 186 lb (84.4 kg)     GEN:  Well nourished, well developed in no acute distress HEENT: Normal NECK: No JVD; No carotid bruits LYMPHATICS: No lymphadenopathy CARDIAC: RRR, no murmurs, no rubs, no gallops RESPIRATORY:  Clear to auscultation without rales, wheezing or rhonchi  ABDOMEN: Soft, non-tender, non-distended MUSCULOSKELETAL:  No edema; No deformity  SKIN: Warm and dry NEUROLOGIC:  Alert and oriented x 3 PSYCHIATRIC:  Normal affect   ASSESSMENT:    1. Hereditary hemochromatosis ([place])   2. Syncope and collapse   3. Polycythemia vera ([place])   4. Right renal mass    PLAN:    In order of problems listed above:  Syncope and collapse.  Really looks like vasovagal/dehydration  Type of reaction however with his history of polycythemia vera as well as more importantly hemochromatosis I have to make sure he does not have any arrhythmic component and arrhythmia.  Therefore, I will ask him to have echocardiogram done to assess left ventricular ejection fraction as well as wall thickness, will be scheduled to have Zio patch for a week to make sure he does not have any significant arrhythmia.  We did talk in length about the fact that he need  to stay well-hydrated I also told him if he started feeling sensation that he is ready to passed out he had to sit down or favorably lay down. Hereditary hemochromatosis that being followed by our oncology team.  Stable from that point review Polycythemia vera.  Last H&H was within normal limits Status post nephrectomy secondary to renal cancer doing well from that point review Dyslipidemia I did review his fasting lipid profile which show HDL of 35 LDL 138.  However because of his hemochromatosis and liver problem I would not rush to start therapy for cholesterol yet.  We will get echocardiogram first and then will decide   Medication Adjustments/Labs and Tests Ordered: Current medicines are reviewed at length with the patient today.  Concerns regarding medicines are outlined above.  No orders of the defined types were placed in this encounter.  No orders of the defined types were placed in this encounter.   Signed, Park Liter, MD, [facility]. 12/16/2020 2:32 PM    [facility]

## 2020-12-17 ENCOUNTER — Telehealth: Payer: Self-pay | Admitting: Cardiology

## 2020-12-17 NOTE — Telephone Encounter (Signed)
     Pt said he got his heart monitor yesterday, this morning when he woke up its gone, it fell off while he was asleep. He wanted to know if he needs to go back in the office to get a new one

## 2020-12-20 NOTE — Telephone Encounter (Signed)
Patient returning call.

## 2020-12-20 NOTE — Telephone Encounter (Signed)
Left message for patient to return call.

## 2020-12-20 NOTE — Telephone Encounter (Signed)
Called patient informed him that he will need to call zio and see if it is still active if he can reapply monitor if not they can send him another one. He udnerstood he will call them and let us know if he needs anything further.

## 2020-12-23 ENCOUNTER — Other Ambulatory Visit: Payer: Self-pay | Admitting: Hematology & Oncology

## 2021-01-06 ENCOUNTER — Other Ambulatory Visit: Payer: Self-pay

## 2021-01-06 ENCOUNTER — Ambulatory Visit (HOSPITAL_COMMUNITY): Payer: 59 | Attending: Cardiology

## 2021-01-06 DIAGNOSIS — R55 Syncope and collapse: Secondary | ICD-10-CM | POA: Diagnosis not present

## 2021-01-06 DIAGNOSIS — D45 Polycythemia vera: Secondary | ICD-10-CM | POA: Diagnosis not present

## 2021-01-06 DIAGNOSIS — N2889 Other specified disorders of kidney and ureter: Secondary | ICD-10-CM | POA: Insufficient documentation

## 2021-01-06 LAB — ECHOCARDIOGRAM COMPLETE
AR max vel: 3.16 cm2
AV Area VTI: 3.17 cm2
AV Area mean vel: 3.05 cm2
AV Mean grad: 2 mmHg
AV Peak grad: 4.5 mmHg
Ao pk vel: 1.06 m/s
Area-P 1/2: 3.37 cm2
S' Lateral: 2.5 cm

## 2021-01-07 ENCOUNTER — Ambulatory Visit: Payer: 59 | Admitting: Neurology

## 2021-01-07 ENCOUNTER — Encounter: Payer: Self-pay | Admitting: Neurology

## 2021-01-07 VITALS — BP 104/71 | HR 92 | Resp 20 | Ht 72.0 in | Wt 188.0 lb

## 2021-01-07 DIAGNOSIS — R413 Other amnesia: Secondary | ICD-10-CM | POA: Diagnosis not present

## 2021-01-07 NOTE — Progress Notes (Signed)
NEUROLOGY CONSULTATION NOTE  Thomas Hancock MRN: [id number] DOB: 1956/01/22  Referring provider: Dr. Riki Sheer Primary care provider: Dr. Riki Sheer  Reason for consult:  memory loss  Dear Dr Nani Ravens:  Thank you for your kind referral of Thomas Hancock for consultation of the above symptoms. Although his history is well known to you, please allow me to reiterate it for the purpose of our medical record. The patient was accompanied to the clinic by his wife Cinda Quest who also provides collateral information, their daughter Hiram Gash is on video conference. Records and images were personally reviewed where available.   HISTORY OF PRESENT ILLNESS: This is a pleasant 65 year old right-handed man with a history of renal cell carcinoma s/p resection, hemochromatosis, polycythemia vera, presenting for evaluation of memory loss that started around a year ago. He has noticed that he cannot focus on things. He would be at work doing something, turns around and forgets what he was doing, taking a few seconds to get back. He has a harder time multitasking. His co-workers have started noticing as well. 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 His family started noticing difficulties with managing finances, Hiram Gash reports that he had always been the sole caretaker of the family but started needing help with filling out paperwork, with his phone. He had missed bill payments, yesterday he could not figure out if he still owes them or not. He denies getting lost driving however [place] reports an incident 2 years ago when he drove for 30 hours from [place] and stopped in the middle of the road in [place], not knowing where he was. He called his son and the police also stopped. Since then, he has had more trouble with navigation. He denies missing medications. He misplaces things more frequently. He forgets prior conversations and repeats himself. Family also noticed that he has  become more fearful to drive on the highway. He has never liked talking to anyone, but is now even less social, even at work he tries to avoid socialization. He is more irritable and frustrated, he states mood is okay and tries his best to calm himself down. Hiram Gash has noticed that he becomes more forgetful when he gets anxious/frustrated. His wife notes he looks like he is confused. No paranoia or hallucinations. No REM behavior disorder.   He denies any significant headaches but notes some frontal pressure as I speak with him. He has occasional problems swallowing, noting a lot of mucous, no choking. No diplopia, neck/back pain, focal numbness/tingling/weakness, bowel/bladder dysfunction, anosmia, or tremors. The tips of his fingers sometimes feel pressure/very heavy. He has occasional lightheadedness. He had a syncopal episode last 11/03/20 while working in the yard, there were no warning symptoms, they were able to sit him on the chair. He was evaluated by Cardiology. Sleep is very good.    Laboratory Data: Lab Results  Component Value Date   TSH 0.47 10/29/2020   Lab Results  Component Value Date   [id number] 308 12/09/2020     Chemistry      Component Value Date/Time   NA 139 12/07/2020 0805   K 4.2 12/07/2020 0805   CL 103 12/07/2020 0805   CO2 27 12/07/2020 0805   BUN 16 12/07/2020 0805   CREATININE 1.06 12/07/2020 0805      Component Value Date/Time   CALCIUM 9.5 12/07/2020 0805   ALKPHOS 67 12/07/2020 0805   AST 19 12/07/2020 0805   ALT 13 12/07/2020 0805  BILITOT 0.7 12/07/2020 0805        PAST MEDICAL HISTORY: Past Medical History:  Diagnosis Date   Anxiety    Complication of anesthesia    after anesthesia muscle pain   Depression    GERD (gastroesophageal reflux disease)    Hemochromatosis 10/16/2019   Polycythemia vera ([facility])    Polycythemia vera ([facility]) 10/16/2019   Renal cell cancer, right ([facility]) 10/16/2019   Right renal mass    Vitamin B12 deficiency      PAST SURGICAL HISTORY: Past Surgical History:  Procedure Laterality Date   COLONOSCOPY WITH ESOPHAGOGASTRODUODENOSCOPY (EGD)  07/2019   NECK SURGERY     ROBOTIC ASSITED PARTIAL NEPHRECTOMY Right 10/10/2019   Procedure: XI ROBOTIC ASSITED PARTIAL NEPHRECTOMY;  Surgeon: Winter, Christopher Aaron, MD;  Location: [facility];  Service: Urology;  Laterality: Right;    MEDICATIONS: Current Outpatient Medications on File Prior to Visit  Medication Sig Dispense Refill   ASPIRIN 81 PO Take 1 tablet by mouth daily.     Cyanocobalamin (B-12 PO) Take 1 tablet by mouth daily.     hydroxyurea (HYDREA) 500 MG capsule TAKE 1 CAPSULE BY MOUTH TWICE DAILY WITH FOOD TO  MINIMIZE GI SIDE EFFECTS 60 capsule 0   No current facility-administered medications on file prior to visit.    ALLERGIES: No Known Allergies  FAMILY HISTORY: Family History  Problem Relation Age of Onset   Colon cancer Neg Hx    Esophageal cancer Neg Hx     SOCIAL HISTORY: Social History   Socioeconomic History   Marital status: Married    Spouse name: Not on file   Number of children: 2   Years of education: Not on file   Highest education level: Not on file  Occupational History   Occupation: Sales  Tobacco Use   Smoking status: Former    Types: Cigarettes   Smokeless tobacco: Never   Tobacco comments:    quit over 20 years ago  Vaping Use   Vaping Use: Never used  Substance and Sexual Activity   Alcohol use: Never   Drug use: Never   Sexual activity: Not on file  Other Topics Concern   Not on file  Social History Narrative   Right handed   Drinks caffeine   Two story home   Social Determinants of Health   Financial Resource Strain: Not on file  Food Insecurity: Not on file  Transportation Needs: Not on file  Physical Activity: Not on file  Stress: Not on file  Social Connections: Not on file  Intimate Partner Violence: Not on file     PHYSICAL EXAM: Vitals:   01/07/21 1032  BP: 104/71   Pulse: 92  Resp: 20  SpO2: 97%   General: No acute distress Head:  Normocephalic/atraumatic Skin/Extremities: No rash, no edema Neurological Exam: Mental status: alert and oriented to person, month year. No dysarthria or aphasia, Fund of knowledge is appropriate.  Recent and remote memory are impaired.  Attention and concentration are reduced.    Able to name objects, difficulty with repetition. Difficulty with visuopatial/executive function tasks. MOCA 14/30 Montreal Cognitive Assessment  01/07/2021  Visuospatial/ Executive (0/5) 3  Naming (0/3) 2  Attention: Read list of digits (0/2) 2  Attention: Read list of letters (0/1) 0  Attention: Serial 7 subtraction starting at 100 (0/3) 1  Language: Repeat phrase (0/2) 0  Language : Fluency (0/1) 0  Abstraction (0/2) 2  Delayed Recall (0/5) 0  Orientation (0/6) 4  Total 14      Cranial nerves: CN I: not tested CN II: pupils equal, round and reactive to light, visual fields intact CN III, IV, VI:  full range of motion, no nystagmus, no ptosis CN V: facial sensation intact CN VII: upper and lower face symmetric CN VIII: hearing intact to conversation CN XI: sternocleidomastoid and trapezius muscles intact CN XII: tongue midline Bulk & Tone: normal, no fasciculations. Motor: 5/5 throughout with no pronator drift. Sensation: intact to light touch, cold, pin, vibration sense.  No extinction to double simultaneous stimulation.  Romberg test negative Deep Tendon Reflexes: +2 throughout Cerebellar: no incoordination on finger to nose testing Gait: narrow-based and steady, able to tandem walk adequately. Tremor: none   IMPRESSION: This is a pleasant 65 year old right-handed man with a history of renal cell carcinoma s/p resection, hemochromatosis, polycythemia vera, presenting for evaluation of memory loss that started around a year ago. Neurological exam is non-focal, MOCA score today 14/30. Etiology of symptoms unclear, we discussed  different causes of memory loss. Bloodwork for ESR, CRP, ANA, ammonia, RPR will be ordered. Schedule MRI brain with and without contrast to assess for underlying structural abnormality and assess vascular load. EEG will be done as well. He will be scheduled for Neurocognitive testing to further evaluate cognitive concerns. We discussed the importance of control of vascular risk factors, physical exercise, brain stimulation exercises and MIND diet for overall brain health. Follow-up after tests, call for any changes.   Thank you for allowing me to participate in the care of this patient. Please do not hesitate to call for any questions or concerns.    , M.D.  CC: Dr. Wendling   

## 2021-01-07 NOTE — Patient Instructions (Signed)
Good to meet you.  Schedule MRI brain with and without contrast  Bloodwork for RPR, ammonia, ESR, CRP, ANA  3. Schedule routine EEG   4. Schedule Neurocognitive testing  You have been referred for a neuropsychological evaluation (i.e., evaluation of memory and thinking abilities). Please bring someone with you to this appointment if possible, as it is helpful for the doctor to hear from both you and another adult who knows you well. Please bring eyeglasses and hearing aids if you wear them.    The evaluation will take approximately 3 hours and has two parts:   The first part is a clinical interview with the neuropsychologist (Dr. Melvyn Novas or Dr. Nicole Kindred). During the interview, the neuropsychologist will speak with you and the individual you brought to the appointment.    The second part of the evaluation is testing with the doctor's technician Hinton Dyer or Maudie Mercury). During the testing, the technician will ask you to remember different types of material, solve problems, and answer some questionnaires. Your family member will not be present for this portion of the evaluation.   Please note: We must reserve several hours of the neuropsychologist's time and the psychometrician's time for your evaluation appointment. As such, there is a No-Show fee of $100. If you are unable to attend any of your appointments, please contact our office as soon as possible to reschedule.   RECOMMENDATIONS FOR ALL PATIENTS WITH MEMORY PROBLEMS: 1. Continue to exercise (Recommend 30 minutes of walking everyday, or 3 hours every week) 2. Increase social interactions - continue going to [place] and enjoy social gatherings with friends and family 3. Eat healthy, avoid fried foods and eat more fruits and vegetables 4. Maintain adequate blood pressure, blood sugar, and blood cholesterol level. Reducing the risk of stroke and cardiovascular disease also helps promoting better memory. 5. Avoid stressful situations. Live a simple life  and avoid aggravations. Organize your time and prepare for the next day in anticipation. 6. Sleep well, avoid any interruptions of sleep and avoid any distractions in the bedroom that may interfere with adequate sleep quality 7. Avoid sugar, avoid sweets as there is a strong link between excessive sugar intake, diabetes, and cognitive impairment We discussed the Mediterranean diet, which has been shown to help patients reduce the risk of progressive memory disorders and reduces cardiovascular risk. This includes eating fish, eat fruits and green leafy vegetables, nuts like almonds and hazelnuts, walnuts, and also use olive oil. Avoid fast foods and fried foods as much as possible. Avoid sweets and sugar as sugar use has been linked to worsening of memory function.       Mediterranean Diet  Why follow it? Research shows. Those who follow the Mediterranean diet have a reduced risk of heart disease  The diet is associated with a reduced incidence of Parkinson's and Alzheimer's diseases People following the diet may have longer life expectancies and lower rates of chronic diseases  The Dietary Guidelines for Americans recommends the Mediterranean diet as an eating plan to promote health and prevent disease  What Is the Mediterranean Diet?  Healthy eating plan based on typical foods and recipes of Mediterranean-style cooking The diet is primarily a plant based diet; these foods should make up a majority of meals   Starches - Plant based foods should make up a majority of meals - They are an important sources of vitamins, minerals, energy, antioxidants, and fiber - Choose whole grains, foods high in fiber and minimally processed items  - Typical grain  sources include wheat, oats, barley, corn, brown rice, bulgar, farro, millet, polenta, couscous  - Various types of beans include chickpeas, lentils, fava beans, black beans, white beans   Fruits  Veggies - Large quantities of antioxidant rich fruits &  veggies; 6 or more servings  - Vegetables can be eaten raw or lightly drizzled with oil and cooked  - Vegetables common to the traditional Mediterranean Diet include: artichokes, arugula, beets, broccoli, brussel sprouts, cabbage, carrots, celery, collard greens, cucumbers, eggplant, kale, leeks, lemons, lettuce, mushrooms, okra, onions, peas, peppers, potatoes, pumpkin, radishes, rutabaga, shallots, spinach, sweet potatoes, turnips, zucchini - Fruits common to the Mediterranean Diet include: apples, apricots, avocados, cherries, clementines, dates, figs, grapefruits, grapes, melons, nectarines, oranges, peaches, pears, pomegranates, strawberries, tangerines  Fats - Replace butter and margarine with healthy oils, such as olive oil, canola oil, and tahini  - Limit nuts to no more than a handful a day  - Nuts include walnuts, almonds, pecans, pistachios, pine nuts  - Limit or avoid candied, honey roasted or heavily salted nuts - Olives are central to the [employer] - can be eaten whole or used in a variety of dishes   Meats Protein - Limiting red meat: no more than a few times a month - When eating red meat: choose lean cuts and keep the portion to the size of deck of cards - Eggs: approx. 0 to 4 times a week  - Fish and lean poultry: at least 2 a week  - Healthy protein sources include, chicken, [place], lean beef, lamb - Increase intake of seafood such as tuna, salmon, trout, mackerel, shrimp, scallops - Avoid or limit high fat processed meats such as sausage and bacon  Dairy - Include moderate amounts of low fat dairy products  - Focus on healthy dairy such as fat free yogurt, skim milk, low or reduced fat cheese - Limit dairy products higher in fat such as whole or 2% milk, cheese, ice cream  Alcohol - Moderate amounts of red wine is ok  - No more than 5 oz daily for women (all ages) and men older than age 26  - No more than 10 oz of wine daily for men younger than 23  Other - Limit  sweets and other desserts  - Use herbs and spices instead of salt to flavor foods  - Herbs and spices common to the traditional Mediterranean Diet include: basil, bay leaves, chives, cloves, cumin, fennel, garlic, lavender, marjoram, mint, oregano, parsley, pepper, rosemary, sage, savory, sumac, tarragon, thyme   It's not just a diet, it's a lifestyle:  The Mediterranean diet includes lifestyle factors typical of those in the region  Foods, drinks and meals are best eaten with others and savored Daily physical activity is important for overall good health This could be strenuous exercise like running and aerobics This could also be more leisurely activities such as walking, housework, yard-work, or taking the stairs Moderation is the key; a balanced and healthy diet accommodates most foods and drinks Consider portion sizes and frequency of consumption of certain foods   Meal Ideas & Options:  Breakfast:  Whole wheat toast or whole wheat English muffins with peanut butter & hard boiled egg Steel cut oats topped with apples & cinnamon and skim milk  Fresh fruit: banana, strawberries, melon, berries, peaches  Smoothies: strawberries, bananas, greek yogurt, peanut butter Low fat greek yogurt with blueberries and granola  Egg white omelet with spinach and mushrooms Breakfast couscous: whole wheat couscous, apricots, skim  milk, cranberries  Sandwiches:  Hummus and grilled vegetables (peppers, zucchini, squash) on whole wheat bread   Grilled chicken on whole wheat pita with lettuce, tomatoes, cucumbers or tzatziki  [place] salad on whole wheat bread: tuna salad made with greek yogurt, olives, red peppers, capers, green onions Garlic rosemary lamb pita: lamb sauted with garlic, rosemary, salt & pepper; add lettuce, cucumber, greek yogurt to pita - flavor with lemon juice and black pepper  Seafood:  Mediterranean grilled salmon, seasoned with garlic, basil, parsley, lemon juice and black  pepper Shrimp, lemon, and spinach whole-grain pasta salad made with low fat greek yogurt  Seared scallops with lemon orzo  Seared tuna steaks seasoned salt, pepper, coriander topped with tomato mixture of olives, tomatoes, olive oil, minced garlic, parsley, green onions and cappers  Meats:  Herbed greek chicken salad with kalamata olives, cucumber, feta  Red bell peppers stuffed with spinach, bulgur, lean ground beef (or lentils) & topped with feta   Kebabs: skewers of chicken, tomatoes, onions, zucchini, squash  [place] burgers: made with red onions, mint, dill, lemon juice, feta cheese topped with roasted red peppers Vegetarian Cucumber salad: cucumbers, artichoke hearts, celery, red onion, feta cheese, tossed in olive oil & lemon juice  Hummus and whole grain pita points with a greek salad (lettuce, tomato, feta, olives, cucumbers, red onion) Lentil soup with celery, carrots made with vegetable broth, garlic, salt and pepper  Tabouli salad: parsley, bulgur, mint, scallions, cucumbers, tomato, radishes, lemon juice, olive oil, salt and pepper.

## 2021-01-12 NOTE — Progress Notes (Signed)
MRI approval July 18th until Aug 16th   Request number [id number]

## 2021-01-13 ENCOUNTER — Other Ambulatory Visit: Payer: Self-pay

## 2021-01-13 ENCOUNTER — Ambulatory Visit (INDEPENDENT_AMBULATORY_CARE_PROVIDER_SITE_OTHER): Payer: 59 | Admitting: Neurology

## 2021-01-13 DIAGNOSIS — R413 Other amnesia: Secondary | ICD-10-CM | POA: Diagnosis not present

## 2021-01-14 NOTE — Procedures (Signed)
ELECTROENCEPHALOGRAM REPORT  Date of Study: 01/13/2021  Patient's Name: Thomas Hancock MRN: [id number] Date of Birth: Nov 25, 1955  Referring Provider: Dr. Ellouise Newer  Clinical History:  This is a 65 year old man with memory loss, confusion.   Medications: ASPIRIN 81 PO B-12 PO HYDREA 500 MG capsule  Technical Summary: A multichannel digital EEG recording measured by the international 10-20 system with electrodes applied with paste and impedances below 5000 ohms performed in our laboratory with EKG monitoring in an awake and asleep patient.  Hyperventilation was not performed. Photic stimulation was performed.  The digital EEG was referentially recorded, reformatted, and digitally filtered in a variety of bipolar and referential montages for optimal display.    Description: The patient is awake and asleep during the recording.  During maximal wakefulness, there is a symmetric, medium voltage 8 Hz posterior dominant rhythm that attenuates with eye opening.  The record is symmetric.  During drowsiness and sleep, there is an increase in theta slowing of the background, with shifting asymmetry over the bilateral temporal regions.  Vertex waves and symmetric sleep spindles were seen.  Photic stimulation did not elicit any abnormalities.  There were no epileptiform discharges or electrographic seizures seen.    EKG lead was unremarkable.  Impression: This awake and asleep EEG is within normal limits.  Clinical Correlation: A normal EEG does not exclude a clinical diagnosis of epilepsy.  If further clinical questions remain, prolonged EEG may be helpful.  Clinical correlation is advised.   Ellouise Newer, M.D.

## 2021-01-15 ENCOUNTER — Ambulatory Visit (HOSPITAL_COMMUNITY)
Admission: RE | Admit: 2021-01-15 | Discharge: 2021-01-15 | Disposition: A | Discharge status: Home / Self Care | Payer: 59 | Source: Ambulatory Visit | Attending: Neurology | Admitting: Neurology

## 2021-01-15 DIAGNOSIS — R413 Other amnesia: Secondary | ICD-10-CM | POA: Diagnosis present

## 2021-01-15 IMAGING — MR MR HEAD WO/W CM
14 of 16 series · 40 of 48 positions shown · IV contrast (gadavist)
Comparison: Head CT [DATE].

CLINICAL DATA: 65-year-old male with loss of focus, memory loss.
History of treated renal cell carcinoma. Hemochromatosis

EXAM:
MRI HEAD WITHOUT AND WITH CONTRAST
TECHNIQUE: Multiplanar, multiecho pulse sequences of the brain and surrounding
structures were obtained without and with intravenous contrast.
CONTRAST:  9mL GADAVIST GADOBUTROL 1 MMOL/ML IV SOLN

[Series 5: DWI · axial · 3.0mm · 0.88mm/px · z∈[-62,+80]mm · 6 of 100 slices shown (1 of 4)]
[im 1/100]
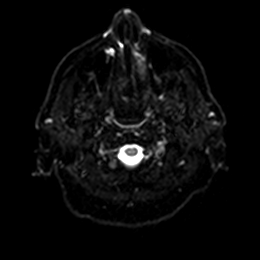
[im 20/100]
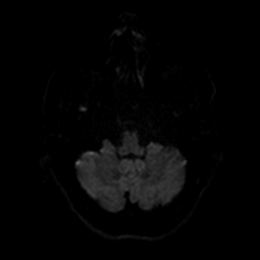
[im 40/100]
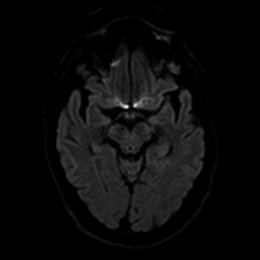
[im 60/100]
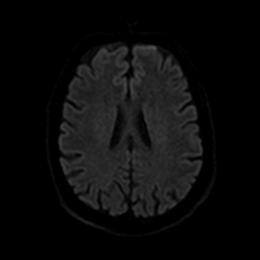
[im 80/100]
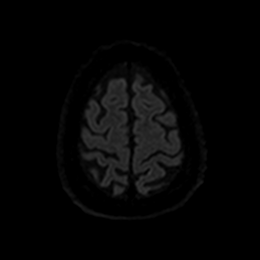
[im 100/100]
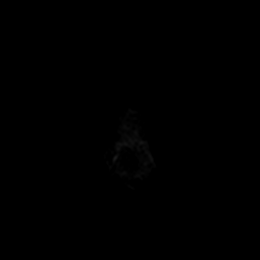

[Series 6: DWI · axial · 3.0mm · 0.88mm/px · z∈[-62,+80]mm · 3 of 50 slices shown (2 of 4)]
[im 1/50]
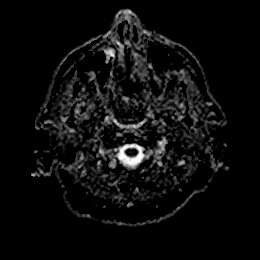
[im 25/50]
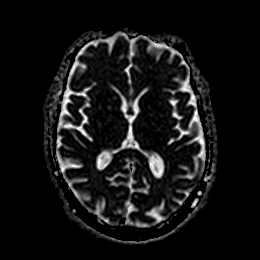
[im 50/50]
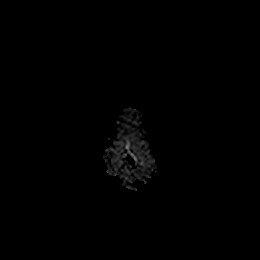

[Series 7: DWI · coronal · 4.0mm · 0.88mm/px · 5 of 72 slices shown (3 of 4)]
[im 1/72]
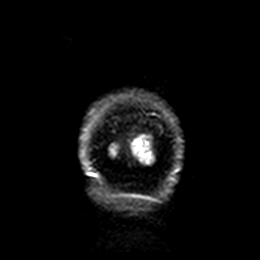
[im 18/72]
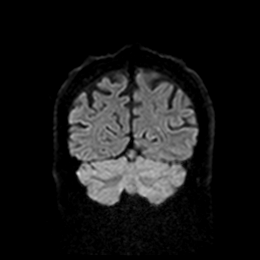
[im 36/72]
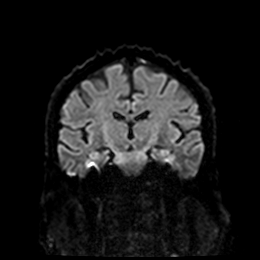
[im 54/72]
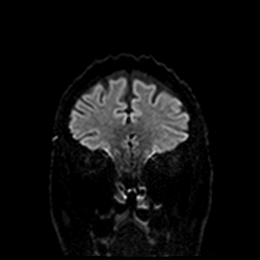
[im 72/72]
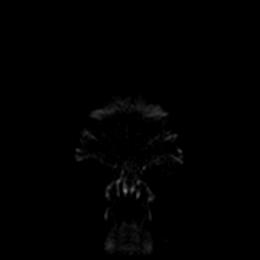

[Series 8: DWI · coronal · 4.0mm · 0.88mm/px · 2 of 36 slices shown (4 of 4)]
[im 1/36]
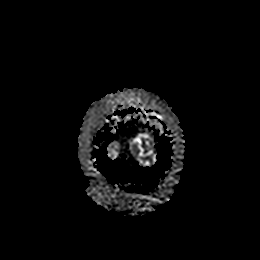
[im 36/36]
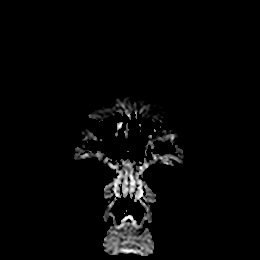

[Series 9: T1 · sagittal · 5.0mm · 0.75mm/px · 1 of 23 slices shown]
[im 1/23]
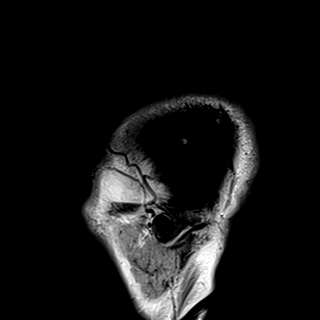

[Series 10: T2 · axial · 5.0mm · 0.72mm/px · z∈[-66,+84]mm · 2 of 27 slices shown]
[im 1/27]
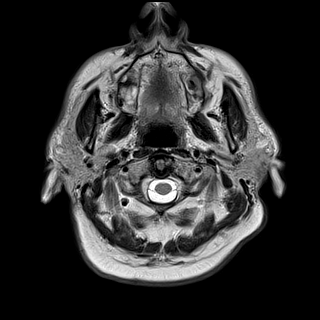
[im 27/27]
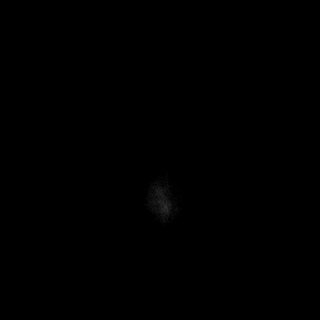

[Series 11: FLAIR · axial · 5.0mm · 0.45mm/px · z∈[-67,+84]mm · 2 of 27 slices shown]
[im 1/27]
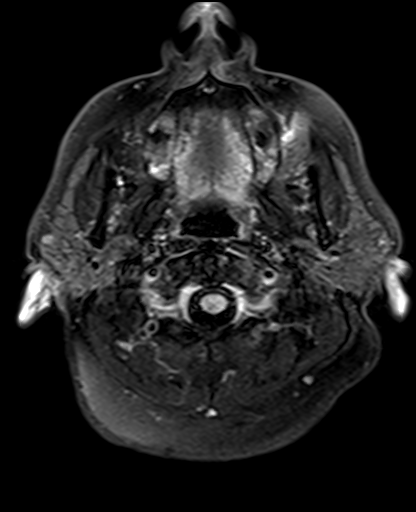
[im 27/27]
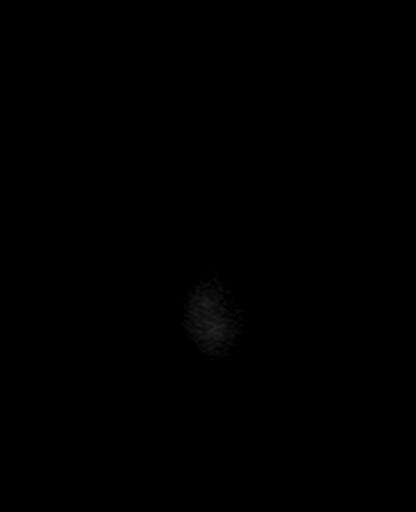

[Series 12: mag_images · axial · 3.0mm · 0.90mm/px · z∈[-77,+94]mm · 4 of 60 slices shown]
[im 1/60]
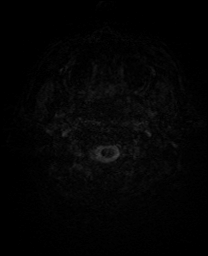
[im 20/60]
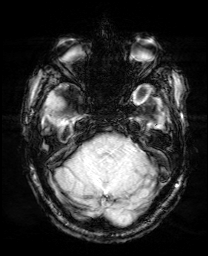
[im 40/60]
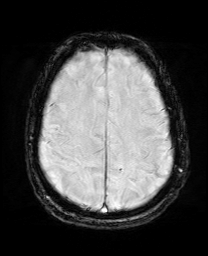
[im 60/60]
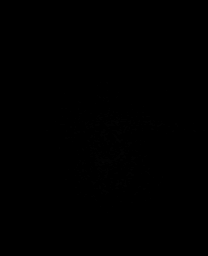

[Series 13: pha_images · axial · 3.0mm · 0.90mm/px · z∈[-77,+79]mm · 3 of 55 slices shown]
[im 1/55]
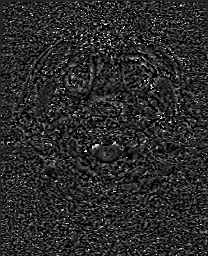
[im 28/55]
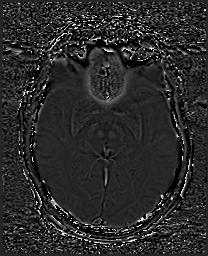
[im 55/55]
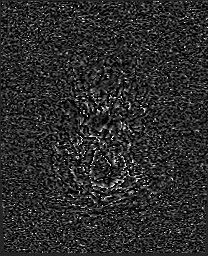

[Series 14: swi_images · axial · 3.0mm · 0.90mm/px · z∈[-77,+94]mm · 4 of 60 slices shown]
[im 1/60]
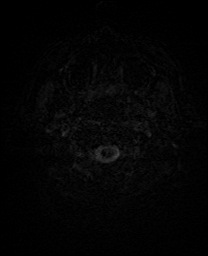
[im 20/60]
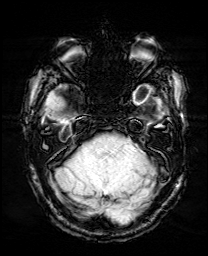
[im 40/60]
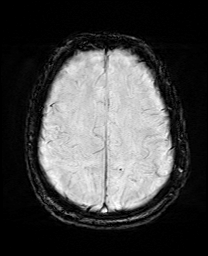
[im 60/60]
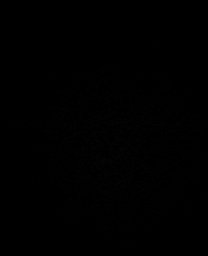

[Series 15: mip_images(sw) · axial · 24.0mm · 0.90mm/px · z∈[-67,+84]mm · 3 of 53 slices shown]
[im 1/53]
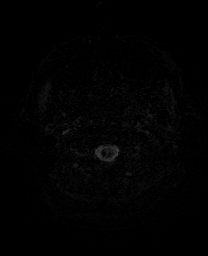
[im 27/53]
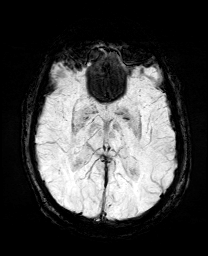
[im 53/53]
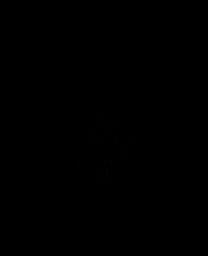

[Series 17: T2 post-contrast · coronal · 5.0mm · 0.72mm/px · 2 of 30 slices shown]
[im 1/30]
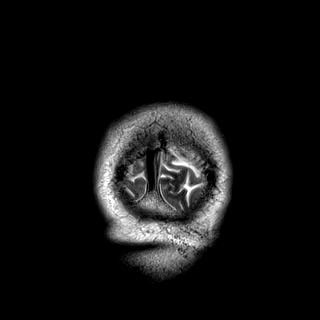
[im 30/30]
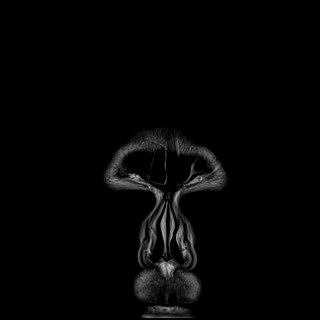

[Series 19: T1 post-contrast · coronal · 5.0mm · 0.34mm/px · 2 of 30 slices shown (1 of 2)]
[im 1/30]
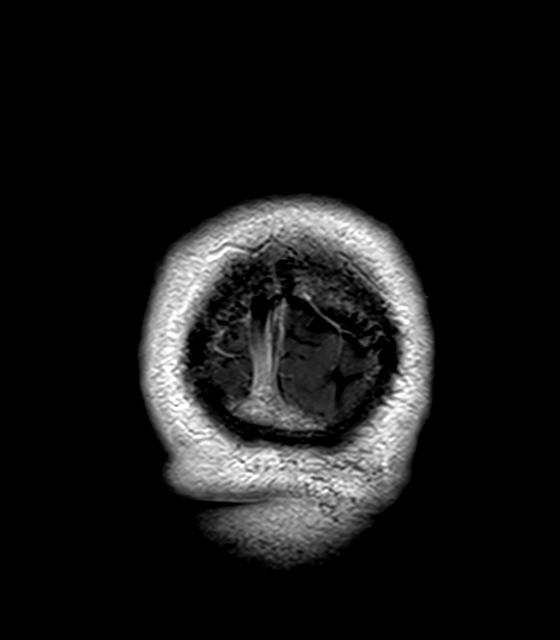
[im 30/30]
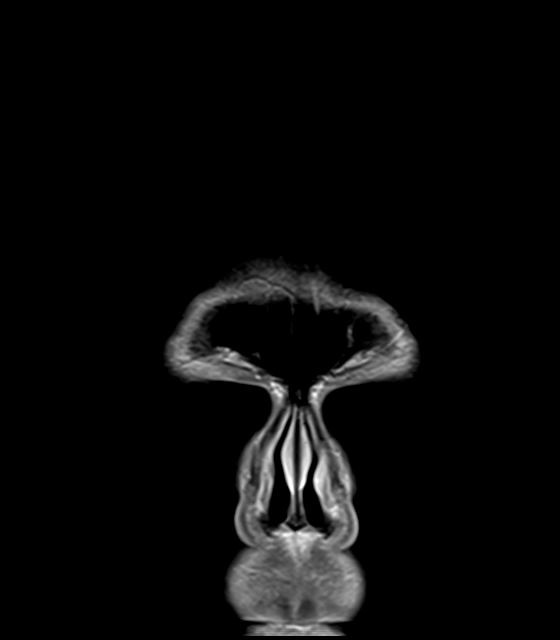

[Series 20: T1 post-contrast · sagittal · 5.0mm · 0.72mm/px · 1 of 23 slices shown (2 of 2)]
[im 1/23]
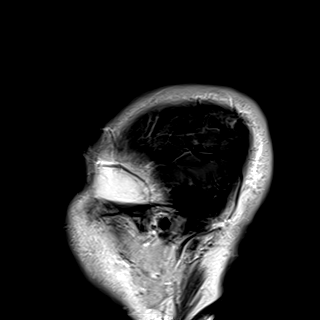

[40 of 48 positions shown; findings below may reference images not displayed]

FINDINGS: Brain: Cerebral volume is within normal limits for age. No
restricted diffusion to suggest acute infarction. No midline shift,
mass effect, evidence of mass lesion, ventriculomegaly, extra-axial
collection or acute intracranial hemorrhage. Cervicomedullary
junction and pituitary are within normal limits.

Small chronic lacunar infarct of the right caudate nucleus (series
10, image 15). No cortical encephalomalacia identified, although
there are occasional chronic micro hemorrhages in the brain (right
parietal lobe series 14, image 38). Otherwise normal gray and white
matter signal throughout the brain. Mesial temporal lobes appear
symmetric and within normal limits for age.

No abnormal enhancement identified. No dural thickening.

Vascular: Major intracranial vascular flow voids are preserved. The
major dural venous sinuses are enhancing and appear to be patent.

Skull and upper cervical spine: Visualized bone marrow signal is
within normal limits. Negative for age visible cervical spine.

Sinuses/Orbits: Negative orbits. There are several small paranasal
sinus mucous retention cysts which appear inconsequential.

Other: Mastoids are clear. Visible internal auditory structures
appear normal. Negative visible scalp and face.
IMPRESSION: 1. No metastatic disease or acute intracranial abnormality.
2. Minimal to mild for age chronic small vessel disease, including a
small chronic lacunar infarct of the right caudate and a right
parietal lobe chronic microhemorrhage.

## 2021-01-15 MED ORDER — GADOBUTROL 1 MMOL/ML IV SOLN
9.0000 mL | Freq: Once | INTRAVENOUS | Status: AC | PRN
  Administered 2021-01-15: 9 mL via INTRAVENOUS

## 2021-01-17 ENCOUNTER — Telehealth: Payer: Self-pay | Admitting: *Deleted

## 2021-01-17 LAB — SEDIMENTATION RATE: Sed Rate: 11 mm/hr (ref 0–30)

## 2021-01-17 LAB — RPR: RPR Ser Ql: NONREACTIVE

## 2021-01-17 LAB — AMMONIA: Ammonia: 61 ug/dL (ref 40–200)

## 2021-01-17 LAB — ANA W/REFLEX: Anti Nuclear Antibody (ANA): NEGATIVE

## 2021-01-17 LAB — C-REACTIVE PROTEIN: CRP: 1 mg/L (ref 0–10)

## 2021-01-17 NOTE — Telephone Encounter (Signed)
Left message for pt to call us back about overdue monitor return. Has he sent it back and if not can he please do it.

## 2021-01-19 ENCOUNTER — Telehealth: Payer: Self-pay

## 2021-01-19 NOTE — Telephone Encounter (Signed)
-----   Message from Cameron Sprang, MD sent at 01/17/2021  2:34 PM EDT ----- Pls let him know the bloodwork and brain wave test were normal, proceed with other tests as planned, thanks

## 2021-02-03 ENCOUNTER — Ambulatory Visit (INDEPENDENT_AMBULATORY_CARE_PROVIDER_SITE_OTHER): Payer: 59

## 2021-02-03 ENCOUNTER — Other Ambulatory Visit: Payer: Self-pay

## 2021-02-03 DIAGNOSIS — Z23 Encounter for immunization: Secondary | ICD-10-CM

## 2021-02-03 NOTE — Progress Notes (Signed)
Pt is here today for  2nd shingles vaccine. Pt was given in left deltoid. Pt tolerated well.

## 2021-02-16 ENCOUNTER — Other Ambulatory Visit: Payer: Self-pay

## 2021-02-16 ENCOUNTER — Ambulatory Visit (INDEPENDENT_AMBULATORY_CARE_PROVIDER_SITE_OTHER): Payer: 59 | Admitting: Cardiology

## 2021-02-16 ENCOUNTER — Encounter: Payer: Self-pay | Admitting: Cardiology

## 2021-02-16 VITALS — BP 100/70 | HR 85 | Ht 72.0 in | Wt 188.0 lb

## 2021-02-16 DIAGNOSIS — R55 Syncope and collapse: Secondary | ICD-10-CM

## 2021-02-16 DIAGNOSIS — D45 Polycythemia vera: Secondary | ICD-10-CM

## 2021-02-16 DIAGNOSIS — C641 Malignant neoplasm of right kidney, except renal pelvis: Secondary | ICD-10-CM

## 2021-02-16 DIAGNOSIS — Z13220 Encounter for screening for lipoid disorders: Secondary | ICD-10-CM

## 2021-02-16 MED ORDER — ATORVASTATIN CALCIUM 10 MG PO TABS: 10.0000 mg | ORAL_TABLET | Freq: Every day | ORAL | 3 refills | Status: DC

## 2021-02-16 NOTE — Addendum Note (Signed)
Addended by: Orvan July on: 02/16/2021 09:50 AM   Modules accepted: Orders

## 2021-02-16 NOTE — Progress Notes (Signed)
Cardiology Office Note:    Date:  02/16/2021   ID:  Thomas Hancock, DOB Apr 28, 1956, MRN [id number]  PCP:  Shelda Pal, DO  Cardiologist:  Jenne Campus, MD    Referring MD: Shelda Pal*   Chief Complaint  Patient presents with   Follow-up  I am doing fine  History of Present Illness:    Thomas Hancock is a 65 y.o. male with past medical history significant for hemochromatosis, polycythemia, he was referred to [place] because of episode of syncope.  Apparently he was working in the garden doing very heart work in a very hot day and then became very woozy dizzy came home drink some water and passed out.  Suspicion was that this is vasovagal but because of history of hemochromatosis we for were forced to do some investigation.  The investigation included echocardiogram which showed structurally normal heart, he does have enlargement of the aorta but not critical enough to intervene.  He did wear a monitor however her monitor came off after 1 night.  So we do not have results of it.  He said since the time of seeing him last time he is doing great.  He is asymptomatic denies have any chest pain tightness squeezing pressure burning chest.  I wanted him to wear a monitor again for a week however he declined he said he is fine he will let me know if things will happen again.  Past Medical History:  Diagnosis Date   Anxiety    Complication of anesthesia    after anesthesia muscle pain   Depression    GERD (gastroesophageal reflux disease)    Hemochromatosis 10/16/2019   Polycythemia vera ([place])    Polycythemia vera ([place]) 10/16/2019   Renal cell cancer, right ([place]) 10/16/2019   Right renal mass    Vitamin B12 deficiency     Past Surgical History:  Procedure Laterality Date   COLONOSCOPY WITH ESOPHAGOGASTRODUODENOSCOPY (EGD)  07/2019   NECK SURGERY     ROBOTIC ASSITED PARTIAL NEPHRECTOMY Right 10/10/2019   Procedure: XI ROBOTIC ASSITED PARTIAL NEPHRECTOMY;  Surgeon:  Ceasar Mons, MD;  Location: [facility];  Service: Urology;  Laterality: Right;    Current Medications: Current Meds  Medication Sig   ASPIRIN 81 PO Take 1 tablet by mouth daily.   Cyanocobalamin (B-12 PO) Take 1 tablet by mouth daily. Unknown strength   hydroxyurea (HYDREA) 500 MG capsule TAKE 1 CAPSULE BY MOUTH TWICE DAILY WITH FOOD TO  MINIMIZE GI SIDE EFFECTS (Patient taking differently: Take 500 mg by mouth 2 (two) times daily.)     Allergies:   Patient has no known allergies.   Social History   Socioeconomic History   Marital status: Married    Spouse name: Not on file   Number of children: 2   Years of education: Not on file   Highest education level: Not on file  Occupational History   Occupation: Sales  Tobacco Use   Smoking status: Former    Types: Cigarettes   Smokeless tobacco: Never   Tobacco comments:    quit over 20 years ago  Vaping Use   Vaping Use: Never used  Substance and Sexual Activity   Alcohol use: Never   Drug use: Never   Sexual activity: Not on file  Other Topics Concern   Not on file  Social History Narrative   Right handed   Drinks caffeine   Two story home   Social Determinants of Radio broadcast assistant  Strain: Not on file  Food Insecurity: Not on file  Transportation Needs: Not on file  Physical Activity: Not on file  Stress: Not on file  Social Connections: Not on file     Family History: The patient's family history is negative for Colon cancer and Esophageal cancer. ROS:   Please see the history of present illness.    All 14 point review of systems negative except as described per history of present illness  EKGs/Labs/Other Studies Reviewed:      Recent Labs: 10/29/2020: TSH 0.47 12/07/2020: ALT 13; BUN 16; Creatinine 1.06; Hemoglobin 14.8; Platelet Count 363; Potassium 4.2; Sodium 139  Recent Lipid Panel    Component Value Date/Time   CHOL 232 (H) 12/09/2020 0802   TRIG 224.0 (H) 12/09/2020 0802   HDL  35.80 (L) 12/09/2020 0802   CHOLHDL 6 12/09/2020 0802   VLDL 44.8 (H) 12/09/2020 0802   LDLCALC 138 (H) 10/29/2020 0916   LDLDIRECT 121.0 12/09/2020 0802    Physical Exam:    VS:  BP 100/70 (BP Location: Right Arm, Patient Position: Sitting)   Pulse 85   Ht 6' (1.829 m)   Wt 188 lb (85.3 kg)   SpO2 98%   BMI 25.50 kg/m     Wt Readings from Last 3 Encounters:  02/16/21 188 lb (85.3 kg)  01/07/21 188 lb (85.3 kg)  12/16/20 187 lb (84.8 kg)     GEN:  Well nourished, well developed in no acute distress HEENT: Normal NECK: No JVD; No carotid bruits LYMPHATICS: No lymphadenopathy CARDIAC: RRR, no murmurs, no rubs, no gallops RESPIRATORY:  Clear to auscultation without rales, wheezing or rhonchi  ABDOMEN: Soft, non-tender, non-distended MUSCULOSKELETAL:  No edema; No deformity  SKIN: Warm and dry LOWER EXTREMITIES: no swelling NEUROLOGIC:  Alert and oriented x 3 PSYCHIATRIC:  Normal affect   ASSESSMENT:    1. Syncope and collapse   2. Polycythemia vera ([place])   3. Hereditary hemochromatosis ([place])   4. Renal cell cancer, right ([facility])    PLAN:    In order of problems listed above:  Syncope and collapse felt to be vasovagal I encouraged him to drink plenty of fluids.  He does have low blood pressure to begin with I told him also if he started feeling woozy he need to drink; avoid and eat some salty food. I did want him to wear a monitor for another week however he declined.  He said he is fine without it.  He will let me know if situation will change and he wanted to wear it. Dyslipidemia I did calculate his 10 years risk which came as intermediate 10.5.  I offered him cholesterol medication he accepted.  He does have hemochromatosis, therefore we need to be careful about liver function test.  I did review chart showing always normal AST ALT.  Therefore we will give him a small dose of Lipitor only 10 mg in about 4 weeks we will recheck fasting lipid profile as well as AST  ALT. History of renal cancer doing well from that point review.   Medication Adjustments/Labs and Tests Ordered: Current medicines are reviewed at length with the patient today.  Concerns regarding medicines are outlined above.  No orders of the defined types were placed in this encounter.  Medication changes: No orders of the defined types were placed in this encounter.   Signed, Park Liter, MD, [facility] 02/16/2021 9:37 AM    [place]

## 2021-02-16 NOTE — Patient Instructions (Signed)
Medication Instructions:  Your physician has recommended you make the following change in your medication:  START: Lipitor 10 mg once daily *If you need a refill on your cardiac medications before your next appointment, please call your pharmacy*   Lab Work: Your physician recommends that you return for lab work in:  In 1 month: Lipids, AST, ALT - please come fasting If you have labs (blood work) drawn today and your tests are completely normal, you will receive your results only by: [place] (if you have MyChart) OR A paper copy in the mail If you have any lab test that is abnormal or we need to change your treatment, we will call you to review the results.   Testing/Procedures: None   Follow-Up: At [facility], you and your health needs are our priority.  As part of our continuing mission to provide you with exceptional heart care, we have created designated Provider Care Teams.  These Care Teams include your primary Cardiologist (physician) and Advanced Practice Providers (APPs -  Physician Assistants and Nurse Practitioners) who all work together to provide you with the care you need, when you need it.  We recommend signing up for the patient portal called "MyChart".  Sign up information is provided on this After Visit Summary.  MyChart is used to connect with patients for Virtual Visits (Telemedicine).  Patients are able to view lab/test results, encounter notes, upcoming appointments, etc.  Non-urgent messages can be sent to your provider as well.   To learn more about what you can do with MyChart, go to [email or web address].    Your next appointment:   6 month(s)  The format for your next appointment:   In Person  Provider:   Jenne Campus, MD   Other Instructions

## 2021-02-25 ENCOUNTER — Other Ambulatory Visit: Payer: Self-pay | Admitting: Hematology & Oncology

## 2021-03-10 ENCOUNTER — Encounter: Payer: Self-pay | Admitting: Hematology & Oncology

## 2021-03-10 ENCOUNTER — Inpatient Hospital Stay: Payer: 59 | Attending: Family

## 2021-03-10 ENCOUNTER — Inpatient Hospital Stay (HOSPITAL_BASED_OUTPATIENT_CLINIC_OR_DEPARTMENT_OTHER): Payer: 59 | Admitting: Hematology & Oncology

## 2021-03-10 ENCOUNTER — Telehealth: Payer: Self-pay

## 2021-03-10 ENCOUNTER — Telehealth: Payer: Self-pay | Admitting: *Deleted

## 2021-03-10 ENCOUNTER — Other Ambulatory Visit: Payer: Self-pay

## 2021-03-10 VITALS — BP 105/81 | HR 87 | Temp 98.1°F | Resp 18 | Wt 189.8 lb

## 2021-03-10 DIAGNOSIS — D45 Polycythemia vera: Secondary | ICD-10-CM | POA: Diagnosis present

## 2021-03-10 DIAGNOSIS — C641 Malignant neoplasm of right kidney, except renal pelvis: Secondary | ICD-10-CM | POA: Insufficient documentation

## 2021-03-10 DIAGNOSIS — R109 Unspecified abdominal pain: Secondary | ICD-10-CM | POA: Insufficient documentation

## 2021-03-10 DIAGNOSIS — Z79899 Other long term (current) drug therapy: Secondary | ICD-10-CM | POA: Diagnosis not present

## 2021-03-10 LAB — CBC WITH DIFFERENTIAL (CANCER CENTER ONLY)
Abs Immature Granulocytes: 0.02 10*3/uL (ref 0.00–0.07)
Basophils Absolute: 0.1 10*3/uL (ref 0.0–0.1)
Basophils Relative: 2 %
Eosinophils Absolute: 0.2 10*3/uL (ref 0.0–0.5)
Eosinophils Relative: 3 %
HCT: 46.3 % (ref 39.0–52.0)
Hemoglobin: 16 g/dL (ref 13.0–17.0)
Immature Granulocytes: 0 %
Lymphocytes Relative: 22 %
Lymphs Abs: 1.2 10*3/uL (ref 0.7–4.0)
MCH: 32.9 pg (ref 26.0–34.0)
MCHC: 34.6 g/dL (ref 30.0–36.0)
MCV: 95.1 fL (ref 80.0–100.0)
Monocytes Absolute: 0.5 10*3/uL (ref 0.1–1.0)
Monocytes Relative: 9 %
Neutro Abs: 3.6 10*3/uL (ref 1.7–7.7)
Neutrophils Relative %: 64 %
Platelet Count: 367 10*3/uL (ref 150–400)
RBC: 4.87 MIL/uL (ref 4.22–5.81)
RDW: 12.4 % (ref 11.5–15.5)
WBC Count: 5.6 10*3/uL (ref 4.0–10.5)
nRBC: 0 % (ref 0.0–0.2)

## 2021-03-10 LAB — CMP (CANCER CENTER ONLY)
ALT: 19 U/L (ref 0–44)
AST: 22 U/L (ref 15–41)
Albumin: 4.6 g/dL (ref 3.5–5.0)
Alkaline Phosphatase: 76 U/L (ref 38–126)
Anion gap: 10 (ref 5–15)
BUN: 18 mg/dL (ref 8–23)
CO2: 25 mmol/L (ref 22–32)
Calcium: 9.3 mg/dL (ref 8.9–10.3)
Chloride: 104 mmol/L (ref 98–111)
Creatinine: 1.12 mg/dL (ref 0.61–1.24)
GFR, Estimated: >60 mL/min (ref >60)
Glucose, Bld: 136 mg/dL — ABNORMAL HIGH (ref 70–99)
Potassium: 3.9 mmol/L (ref 3.5–5.1)
Sodium: 139 mmol/L (ref 135–145)
Total Bilirubin: 0.8 mg/dL (ref 0.3–1.2)
Total Protein: 7.3 g/dL (ref 6.5–8.1)

## 2021-03-10 LAB — IRON AND TIBC
Iron: 96 ug/dL (ref 42–163)
Saturation Ratios: 27 % (ref 20–55)
TIBC: 360 ug/dL (ref 202–409)
UIBC: 264 ug/dL (ref 117–376)

## 2021-03-10 LAB — FERRITIN: Ferritin: 26 ng/mL (ref 24–336)

## 2021-03-10 LAB — LACTATE DEHYDROGENASE: LDH: 149 U/L (ref 98–192)

## 2021-03-10 NOTE — Telephone Encounter (Signed)
Per 03/10/21 los gave upcoming appointments - confirmed - print calendar

## 2021-03-10 NOTE — Progress Notes (Signed)
Hematology and Oncology Follow Up Visit  Jamarrius Offield [id number] [birth year] 65 y.o. 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 Gibbins is here today for follow-up.  Seems like he is having some issues with respect to his memory.  He did have an MRI done.  This was done in July.  It did show some small vessel disease.  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  There is nothing that seems to be significant.  I have to believe that what he is having is probably neurological.  It might be some element of dementia.  I think goes back to see the neurologist next month.  We may have to be more aggressive with his phlebotomy program.  His hematocrit is 46%.  We will see what his iron studies show.  We may have to phlebotomize him.  It has been a while since he has had a phlebotomy.  He sees the urologist next month.  He has a CT scan done.  He does get tired quite easily.  Again I am not sure as to why he would be getting tired.  We will have to see if a possible phlebotomy might help him.  He has had no fever.  He says he has a little bit of a change in taste.  He says he tested for COVID and that was negative.  The change in taste might be from the [facility].  I told him to rinse his mouth out with water and baking soda 4 times a day and this may help his taste buds.  He has had no rashes.  Overall, I would say his performance status is ECOG 1.      Medications:  Allergies as of 03/10/2021   No Known Allergies      Medication List        Accurate as of March 10, 2021 10:54 AM. If you have any questions, ask your nurse or  doctor.          ASPIRIN 81 PO Take 1 tablet by mouth daily.   atorvastatin 10 MG tablet Commonly known as: LIPITOR Take 1 tablet (10 mg total) by mouth daily.   B-12 PO Take 1 tablet by mouth daily. Unknown strength   hydroxyurea 500 MG capsule Commonly known as: HYDREA TAKE 1 CAPSULE BY MOUTH TWICE DAILY WITH FOOD TO  MINIMIZE  GI  SIDE  EFFECTS        Allergies: No Known Allergies  Past Medical History, Surgical history, Social history, and Family History were reviewed and updated.  Review of Systems: Review of Systems  Constitutional: Negative.   HENT: Negative.    Eyes: Negative.   Respiratory: Negative.    Cardiovascular: Negative.   Gastrointestinal:  Positive for abdominal pain.  Genitourinary: Negative.   Musculoskeletal: Negative.   Skin: Negative.   Neurological: Negative.   Endo/Heme/Allergies: Negative.   Psychiatric/Behavioral: Negative.      Physical Exam:  weight is 189 lb 12.8 oz (86.1 kg). His oral temperature is 98.1 F (36.7 C). His blood pressure is 105/81 and his pulse is 87. His respiration is 18  and oxygen saturation is 98%.   Wt Readings from Last 3 Encounters:  03/10/21 189 lb 12.8 oz (86.1 kg)  02/16/21 188 lb (85.3 kg)  01/07/21 188 lb (85.3 kg)    Physical Exam Vitals reviewed.  HENT:     Head: Normocephalic and atraumatic.  Eyes:     Pupils: Pupils are equal, round, and reactive to light.  Cardiovascular:     Rate and Rhythm: Normal rate and regular rhythm.     Heart sounds: Normal heart sounds.  Pulmonary:     Effort: Pulmonary effort is normal.     Breath sounds: Normal breath sounds.  Abdominal:     General: Bowel sounds are normal.     Palpations: Abdomen is soft.     Comments: Abdominal exam shows healing laparoscopy scars.  I think he has 5 scars.  They are well-healed.  There is no erythema.  There is no exudate.  His abdomen is slightly distended.  He has slightly decreased bowel sounds.  There is no guarding.   He has no palpable abdominal mass.  Is no palpable liver or spleen tip.  Musculoskeletal:        General: No tenderness or deformity. Normal range of motion.     Cervical back: Normal range of motion.  Lymphadenopathy:     Cervical: No cervical adenopathy.  Skin:    General: Skin is warm and dry.     Findings: No erythema or rash.  Neurological:     Mental Status: He is alert and oriented to person, place, and time.  Psychiatric:        Behavior: Behavior normal.        Thought Content: Thought content normal.        Judgment: Judgment normal.     Lab Results  Component Value Date   WBC 5.6 03/10/2021   HGB 16.0 03/10/2021   HCT 46.3 03/10/2021   MCV 95.1 03/10/2021   PLT 367 03/10/2021   Lab Results  Component Value Date   FERRITIN 48 12/07/2020   IRON 92 12/07/2020   TIBC 328 12/07/2020   UIBC 236 12/07/2020   IRONPCTSAT 28 12/07/2020   Lab Results  Component Value Date   RETICCTPCT 1.2 07/08/2020   RBC 4.87 03/10/2021   No results found for: KPAFRELGTCHN, LAMBDASER, KAPLAMBRATIO No results found for: IGGSERUM, IGA, IGMSERUM No results found for: Odetta Pink, SPEI   Chemistry      Component Value Date/Time   NA 139 03/10/2021 0914   K 3.9 03/10/2021 0914   CL 104 03/10/2021 0914   CO2 25 03/10/2021 0914   BUN 18 03/10/2021 0914   CREATININE 1.12 03/10/2021 0914      Component Value Date/Time   CALCIUM 9.3 03/10/2021 0914   ALKPHOS 76 03/10/2021 0914   AST 22 03/10/2021 0914   ALT 19 03/10/2021 0914   BILITOT 0.8 03/10/2021 0914       Impression and Plan: Mr. 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  The renal cell carcinoma has been treated with surgery.  Thankfully he did not require a complete nephrectomy.  He is being followed by urology for  this.  Hopefully, neurological issues will be taken care of.  I think he does have a family history of neurological issues.  We will see what his iron levels  are.  We may have to phlebotomize him regardless.  We may have to be aggressive with trying to keep his hematocrit below 45%.  We also may think about get him on a higher dose of aspirin.  We will have to see what the neurologist recommends, if anything.  I would like to get him back in a couple months now.  There is a lot going on that I want to make sure that we follow-up with.   Volanda Napoleon, MD 9/15/202210:54 AM

## 2021-03-10 NOTE — Telephone Encounter (Signed)
-----   Message from Volanda Napoleon, MD sent at 03/10/2021  1:34 PM EDT ----- Call - the iron levels are not bad, but I do think that his red cell count is a little too high!!  We need to phlebotomize 1 unit.  Please set this up.  Laurey Arrow

## 2021-03-10 NOTE — Telephone Encounter (Signed)
Called and left message for patient of lab results, and will need 1 phlebotomy, scheduling to call him to set this up.

## 2021-03-11 ENCOUNTER — Telehealth: Payer: Self-pay

## 2021-03-17 ENCOUNTER — Other Ambulatory Visit: Payer: Self-pay

## 2021-03-17 ENCOUNTER — Inpatient Hospital Stay: Payer: 59

## 2021-03-17 DIAGNOSIS — C641 Malignant neoplasm of right kidney, except renal pelvis: Secondary | ICD-10-CM | POA: Diagnosis not present

## 2021-03-17 NOTE — Patient Instructions (Addendum)
Therapeutic Phlebotomy, Care After This sheet gives you information about how to care for yourself after your procedure. Your health care provider may also give you more specific instructions. If you have problems or questions, contact your health care provider. What can I expect after the procedure? After the procedure, it is common to have: Light-headedness or dizziness. You may feel faint. Nausea. Tiredness (fatigue). Follow these instructions at home: Eating and drinking Be sure to eat well-balanced meals for the next 24 hours. Drink enough fluid to keep your urine pale yellow. Avoid drinking alcohol on the day that you had the procedure. Activity  Return to your normal activities as told by your health care provider. Most people can go back to their normal activities right away. Avoid activities that take a lot of effort for about 5 hours after the procedure. Athletes should avoid strenuous exercise for at least 12 hours. Avoid heavy lifting or pulling for about 5 hours after the procedure. Do not lift anything that is heavier than 10 lb (4.5 kg). Change positions slowly for the remainder of the day. This will help to prevent light-headedness or fainting. If you feel light-headed, lie down until the feeling goes away. Needle insertion site care  Keep your bandage (dressing) dry. You can remove the bandage after about 5 hours or as told by your health care provider. If you have bleeding from the needle insertion site, raise (elevate) your arm and press firmly on the site until the bleeding stops. If you have bruising at the site, apply ice to the area: Remove the dressing. Put ice in a plastic bag. Place a towel between your skin and the bag. Leave the ice on for 20 minutes, 2-3 times a day for the first 24 hours. If the swelling does not go away after 24 hours, apply a warm, moist cloth (warm compress) to the area for 20 minutes, 2-3 times a day. General instructions Do not use any  products that contain nicotine or tobacco, such as cigarettes and e-cigarettes, for at least 30 minutes after the procedure. Keep all follow-up visits as told by your health care provider. This is important. You may need to continue having regular therapeutic phlebotomy treatments as directed. Contact a health care provider if you: Have redness, swelling, or pain at the needle insertion site. Have fluid or blood coming from the needle insertion site. Have pus or a bad smell coming from the needle insertion site. Notice that the needle insertion site feels warm to the touch. Feel light-headed, dizzy, or nauseous, and the feeling does not go away. Have new bruising at the needle insertion site. Feel weaker than normal. Have a fever or chills. Get help right away if: You faint. You have chest pain. You have trouble breathing. You have severe nausea or vomiting. Summary After the procedure, it is common to have some light-headedness, dizziness, nausea, or tiredness (fatigue). Be sure to eat well-balanced meals for the next 24 hours. Drink enough fluid to keep your urine pale yellow. Return to your normal activities as told by your health care provider. Keep all follow-up visits as told by your health care provider. You may need to continue having regular therapeutic phlebotomy treatments as directed. This information is not intended to replace advice given to you by your health care provider. Make sure you discuss any questions you have with your health care provider. Document Revised: 09/21/2020 Document Reviewed: 06/28/2017 Elsevier Patient Education  2022 [place] Polycythemia vera (PV), or  myeloproliferative disease, is a form of blood cancer in which the bone marrow makes too many (overproduces) red blood cells. The bone marrow may also make too many clotting cells (platelets) and white blood cells. Bone marrow is the spongy center of bones where blood cells are  produced. Sometimes, there may be an overproduction of blood cells in the liver and spleen, causing those organs to become enlarged. Additionally, people who have PV are at a higher risk for stroke or heart attack because their blood may clot more easily. PV is a long-term (chronic)disease. What are the causes? Almost all people who have PV have an abnormal gene (genetic mutation) that causes changes in the way that the bone marrow makes blood cells. This gene, which is called JAK2, is not passed along from parent to child (is not hereditary). It is not known what triggers the genetic mutation that causes the body to produce too many red blood cells. What increases the risk? You are more likely to develop this condition if you are: Male. 12 years of age or older. What are the signs or symptoms? You may not have any symptoms in the early stage of PV. When symptoms develop, they may include: Shortness of breath. Dizziness. Hot and flushed skin. Itchy skin. Sweats, especially night sweats. Headache. Tiredness. Ringing in the ears. Blurred vision or blind spots. Bone pain. Weight loss. Fever. Blood-tinged vomit or stool. How is this diagnosed? This condition may be diagnosed during a routine physical exam and a blood test called a complete blood count (CBC). Your health care provider also may suspect PV if you have symptoms. During the physical exam, your provider may find that you have an enlarged liver or spleen. You may also have tests to confirm the diagnosis. These may include: A procedure to remove a sample of bone marrow for testing (bone marrow biopsy). Blood tests to check for: The JAK2 gene. Low levels of a hormone that helps to regulate blood production (erythropoietin). How is this treated? There is no cure for PV, but treatment can help to control the disease. There are several types of treatment. No single treatment works for everyone. You will need to work with a blood cancer  specialist (hematologist) to find the treatment that is best for you. This condition may be treated by: Periodically having some blood removed with a needle (drawn) to lower the number of red blood cells (phlebotomy). Taking medicine. Your health care provider may recommend: Low-dose aspirin to lower your risk for blood clots. A medicine to reduce red blood cell production. A medicine to lower the number of red blood cells. A medicine that slows down the effects of JAK2 gene. Other medicines to treat symptoms such as itching. Follow these instructions at home:  Take over-the-counter and prescription medicines only as told by your health care provider. Return to your normal activities as told by your health care provider. Ask your health care provider what activities are safe for you. Do regular exercise as told by your health care provider. Check your hands and feet regularly for any sores that do not heal. Do not use any products that contain nicotine or tobacco, such as cigarettes, e-cigarettes, and chewing tobacco. If you need help quitting, ask your health care provider. Keep all follow-up visits as told by your health care provider. This is important. Contact a health care provider if: You have side effects from your medicines. Your symptoms change or get worse at home. You have blood in your  stool or you vomit blood. Get help right away if: You have sudden and severe pain in your abdomen. You have chest pain or difficulty breathing. You have signs of stroke, such as: Sudden numbness. Weakness of your face or arm. Confusion. Difficulty speaking or understanding speech. These symptoms may represent a serious problem that is an emergency. Do not wait to see if the symptoms will go away. Get medical help right away. Call your local emergency services (911 in the [place] Do not drive yourself to the hospital. Summary Polycythemia vera is a form of blood cancer in which the bone marrow  makes too many red blood cells. People who have polycythemia vera are at a higher risk for stroke or heart attack because their blood may clot more easily. The disease is caused by a genetic mutation that causes changes in the way that the bone marrow makes blood cells. It is diagnosed with blood tests and a bone marrow biopsy. There is no cure for PV, but treatment can help to control the disease. It is treated by periodically having some blood removed and by taking medicines. This information is not intended to replace advice given to you by your health care provider. Make sure you discuss any questions you have with your health care provider. Document Revised: 04/18/2018 Document Reviewed: 04/18/2018 Elsevier Patient Education  2022 [employer].

## 2021-03-17 NOTE — Progress Notes (Signed)
Thomas Hancock presents today for phlebotomy per MD orders. Phlebotomy procedure started at 1414 and ended at 1428. 549 grams removed via 16 gauge needle to right AC using phlebotomy kit. Patient observed for 30 minutes after procedure without any incident. Patient tolerated procedure well. IV needle removed intact.

## 2021-03-31 ENCOUNTER — Ambulatory Visit (INDEPENDENT_AMBULATORY_CARE_PROVIDER_SITE_OTHER): Payer: 59 | Admitting: Psychology

## 2021-03-31 ENCOUNTER — Encounter: Payer: Self-pay | Admitting: Psychology

## 2021-03-31 ENCOUNTER — Ambulatory Visit: Payer: 59 | Admitting: Psychology

## 2021-03-31 ENCOUNTER — Other Ambulatory Visit: Payer: Self-pay

## 2021-03-31 DIAGNOSIS — R4189 Other symptoms and signs involving cognitive functions and awareness: Secondary | ICD-10-CM

## 2021-03-31 DIAGNOSIS — G3184 Mild cognitive impairment, so stated: Secondary | ICD-10-CM | POA: Insufficient documentation

## 2021-03-31 DIAGNOSIS — I6381 Other cerebral infarction due to occlusion or stenosis of small artery: Secondary | ICD-10-CM | POA: Diagnosis not present

## 2021-03-31 DIAGNOSIS — F330 Major depressive disorder, recurrent, mild: Secondary | ICD-10-CM

## 2021-03-31 DIAGNOSIS — F411 Generalized anxiety disorder: Secondary | ICD-10-CM

## 2021-03-31 DIAGNOSIS — I618 Other nontraumatic intracerebral hemorrhage: Secondary | ICD-10-CM | POA: Insufficient documentation

## 2021-03-31 HISTORY — DX: Mild cognitive impairment of uncertain or unknown etiology: G31.84

## 2021-03-31 NOTE — Progress Notes (Signed)
   Psychometrician Note   Cognitive testing was administered to Thomas Hancock by Milana Kidney, B.S. (psychometrist) under the supervision of Dr. Christia Reading, Ph.D., licensed psychologist on 03/31/21. [name] did not appear overtly distressed by the testing session per behavioral observation or responses across self-report questionnaires. Rest breaks were offered.    The battery of tests administered was selected by Dr. Christia Reading, Ph.D. with consideration to [name]'s current level of functioning, the nature of his symptoms, emotional and behavioral responses during interview, level of literacy, observed level of motivation/effort, and the nature of the referral question. This battery was communicated to the psychometrist. Communication between Dr. Christia Reading, Ph.D. and the psychometrist was ongoing throughout the evaluation and Dr. Christia Reading, Ph.D. was immediately accessible at all times. Dr. Christia Reading, Ph.D. provided supervision to the psychometrist on the date of this service to the extent necessary to assure the quality of all services provided.    Thomas Hancock will return within approximately 1-2 weeks for an interactive feedback session with Thomas Hancock at which time his test performances, clinical impressions, and treatment recommendations will be reviewed in detail. Thomas Hancock understands he can contact our office should he require our assistance before this time.  A total of 105 minutes of billable time were spent face-to-face with Thomas Hancock by the psychometrist. This includes both test administration and scoring time. Billing for these services is reflected in the clinical report generated by Dr. Christia Reading, Ph.D.  This note reflects time spent with the psychometrician and does not include test scores or any clinical interpretations made by Thomas Hancock. The full report will follow in a separate note.

## 2021-03-31 NOTE — Progress Notes (Signed)
NEUROPSYCHOLOGICAL EVALUATION [place] Department of Neurology  Date of Evaluation: March 31, 2021  Reason for Referral:   Bentzion Dauria is a 65 y.o. right-handed male referred by Ellouise Newer, M.D., to characterize his current cognitive functioning and assist with diagnostic clarity and treatment planning in the context of subjective cognitive decline.   Assessment and Plan:   Clinical Impression(s): During testing, Mr. Durflinger experienced significant anxiety to the point were several breaks had to be taken due to him feeling overwhelmed. This anxiety eventually peaked where he requested that the evaluation be discontinued prematurely due to overall symptom significance. As such, the current evaluation was abbreviated in response. Additionally of note, testing was completed in [place] rather than Urdu (his native language) due to the unavailability of a Optometrist. Mr. Curran did not appear to have significant difficultly understanding task instructions per the psychometrist. However, there were instances where he had trouble with articulation and fluency. He was given the opportunity to respond across language tests in his native language (e.g., when stating boys names on a semantic fluency task). However, this did not improve his performance. Given the extent of experienced anxiety and language factors described above, it is reasonable to assume that these negatively impacted performance across tasks that were completed to an unknown extent. As such, the results of the evaluation should be interpreted with caution.  If taken at face value, Mr. Wich's pattern of performance is suggestive of diffuse and generally quite severe cognitive impairment relative to age-matched peers. He did perform adequately on a verbal task assessing phonemic fluency (i.e., rapidly producing words starting with a particular letter of the alphabet). However, all other assessed  cognitive domains were notably impaired. This included processing speed, basic attention, cognitive flexibility, semantic fluency, confrontation naming, visuospatial abilities, and all aspects of learning and memory. Receptive language, working memory, and other aspects of executive functioning were unable to be assessed. Mr. Ellender largely denied difficulties completing instrumental activities of daily living (ADLs) independently outside of a history of forgetting a few bill payments and feeling less sure in his abilities. 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 Given cognitive testing of questionable validity, I feel that a diagnosis of a Mild Neurocognitive Disorder ("mild cognitive impairment") seems most appropriate at the present time. However, this may certainly transition to a major neurocognitive disorder ("dementia") designation in the future depending on the underlying cause of dysfunction and worsening functional abilities.   The etiology of dysfunction is unclear at the present time, both due to the factors described above, as well as diffuse impairment with minimally discernible patterns of strength and weakness across completed testing. Regarding underlying neurologic etiologies, Alzheimer's disease remains a possibility despite his relatively young age. Across memory tests (including those without any language component), he was fully amnestic (i.e., 0% retention) and exhibited very poor performances across yes/no recognition trials. This would suggest a pronounced deficit in memory storage, which is the hallmark characteristic of this illness. A noted discrepancy in verbal fluency (semantic worse than phonemic) also aligns well with expected patterns of performance in this illness. Behaviorally, he does not exhibit symptoms concerning for Lewy body dementia or the behavioral variant of frontotemporal dementia. I cannot exclude consideration of a language variant of frontotemporal dementia  (i.e., semantic or logopenic variant). However, Alzheimer's disease would appear more likely at the present time. There is also a likely vascular component of unknown significance given prior neuroimaging suggesting a lacunar infarct and chronic micro-hemorrhaging  in the right hemisphere. Continued medical monitoring will be important moving forward.  Recommendations: Mr. Strength recently attempted to wear a heart monitor due to experienced syncope; however, this came off after one night and there were no reportable results. His cardiologist (Dr. Agustin Cree) expressed a desire for Mr. Ramnath to wear the monitor again for a week; however, he declined at that time. I agree with Dr. Wendy Poet recommendation, especially as there is some neurological evidence to suggest a cerebrovascular contribution to cognitive dysfunction. Should his heart monitor identify any abnormalities, these would be very important to actively treat to limit future risk.   Should he be more amenable to completing a full evaluation in the future, repeat testing could be considered at that time. If he would feel more comfortable utilizing a translator during testing, that would need to be made known and scheduled ahead of time. The earliest re-evaluation time would be no sooner than 12 months from the current date.   He could discuss the pros and cons of starting memory-based medications with Dr. Delice Lesch given he and his family's reporting of diminishing memory in his day-to-day life. It is important to highlight that these medications may slow functional decline in some individuals. However, no treatment is able to stop or reverse cognitive decline if a neurodegenerative illness is present.   A combination of medication and psychotherapy has been shown to be most effective at treating symptoms of anxiety and depression. As such, Mr. Sharron is encouraged to speak with his prescribing physician regarding medication adjustments to  optimally manage these symptoms. Likewise, Mr. Deroo could consider engaging in short-term psychotherapy to address symptoms of psychiatric distress. He would benefit from an active and collaborative therapeutic environment, rather than one purely supportive in nature. Ideally, the therapist would be made aware of cognitive limitations at the outset so that therapy could be designed in a way to maximize efficacy.  Performance across neurocognitive testing is not a strong predictor of an individual's safety operating a motor vehicle. Should his family wish to pursue a formalized driving evaluation, they would be encouraged to contact [employer] in [place], [place] at [phone]. Another option would be through [facility]; however, the latter would likely require a referral from a medical doctor. [facility] can be reached directly at [phone].   Should there be a progression of his current deficits over time, he is unlikely to regain any independent living skills lost. Therefore, it is recommended that he remain as involved as possible in all aspects of household chores, finances, and medication management, with supervision to ensure adequate performance. He will likely benefit from the establishment and maintenance of a routine in order to maximize his functional abilities over time.  It will be important for Mr. Sebastiani to have another person with him when in situations where he may need to process information, weigh the pros and cons of different options, and make decisions, in order to ensure that he fully understands and recalls all information to be considered.  Mr. Delage is encouraged to attend to lifestyle factors for brain health (e.g., regular physical exercise, good nutrition habits, regular participation in cognitively-stimulating activities, and general stress management techniques), which are likely to have benefits for both emotional adjustment and  cognition. In fact, in addition to promoting good general health, regular exercise incorporating aerobic activities (e.g., brisk walking, jogging, cycling, etc.) has been demonstrated to be a very effective treatment for depression and stress, with similar efficacy rates to  both antidepressant medication and psychotherapy. Optimal control of vascular risk factors (including safe cardiovascular exercise and adherence to dietary recommendations) is encouraged. Continued participation in activities which provide mental stimulation and social interaction is also recommended.   For day-to-day problems recalling information, he may benefit from using strategies to aid with his learning and memory, such as asking questions for clarification, requesting that information to be repeated, or repeating an explanation in his own words to ensure comprehension and promote encoding.    To address problems with processing speed, he may wish to consider:   -Ensuring that he is alerted when essential material or instructions are being presented   -Adjusting the speed at which new information is presented   -Allowing for more time in comprehending, processing, and responding in conversation  To address problems with fluctuating attention, he may wish to consider:   -Avoiding external distractions when needing to concentrate   -Limiting exposure to fast paced environments with multiple sensory demands   -Writing down complicated information and using checklists   -Attempting and completing one task at a time (i.e., no multi-tasking)   -Verbalizing aloud each step of a task to maintain focus   -Reducing the amount of information considered at one time  Review of Records:   Mr. Mcclimans was seen by his PCP Shelda Pal, D.O.) on 11/12/2020 to follow-up for a past syncopal episode. On 11/03/2020, he briefly lost consciousness while doing yard work and rising to a standing position. Sugar was 118, SBP in low  100's, pulse in 50's. He was eating and drinking normally leading up to this. No CP, SOB, palpitations. He did not shake, bite his tongue, or lose control of his bowel/bladder function. This was said to be his first syncopal episode. He was referred to cardiology.   Mr. Hidalgo was seen by [facility] Ellouise Newer, M.D.) on 01/07/2021 for an evaluation of memory loss. Difficulties were said to start around a year ago. He reported trouble focusing. For example, he would be at work doing something, would get distracted, and then turn back and forget what he was in the middle of. He further reported increased difficulty multi-tasking; issues were said to have been observed by co-workers. He also noted memory concerns, particularly surrounding him repeating himself and misplacing things around his residence. Regarding ADLs, his family has started noticing difficulties with managing finances. He had missed a few bill payments and has had trouble understanding of he still owes money or not. His daughter described an incident two years ago where he was driving back to [place] from [place]. He reportedly stopped in the middle of the road in [place] and was unsure where he was. He called his son for assistance; it was reported that the police also stopped to assist him. Since that time, he has had more trouble with navigation. He denied missing medications. His family alluded to some personality changes in that he seems more withdrawn and less social. He also seems more easily irritated. His daughter added that he seems more forgetful when he is anxious or frustrated. Sleep was described as good and they reported no hallucinations or paranoia. Headaches were denied; however, he did describe a frontal pressure with unknown cause. He also noted occasional problems swallowing, noting a lot of mucous. Performance on a brief cognitive screening instrument (MOCA) was 14/30. Ultimately, Mr. Polanco was  referred for a comprehensive neuropsychological evaluation to characterize his cognitive abilities and to assist with diagnostic clarity and treatment  planning.   He was most recently seen by Cardiology Jenne Campus, M.D.) on 02/16/2021. The suspicion was that he experienced a bout of vasovagal syncope this past May. However, due to a history of hemochromatosis, additional investigation was said to be prudent. Echocardiogram showed a structurally normal heart. He does have enlargement of the aorta but not critical enough to intervene. He attempted to wear a heart monitor; however, this came off after one night and there were no results. Dr. Agustin Cree expressed a desire for Mr. Bost to wear the monitor again for a week; however, he declined at that time.  Head CT on 11/03/2020 in the context of syncope and collapse was negative for any acute intracranial abnormalities. 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 EEG on 01/13/2021 was normal. Brain MRI on 01/17/2021 revealed mild small vessel ischemic disease, a remote lacunar infarct in the right caudate, and occasional chronic micro-hemorrhages in the right parietal lobe.   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   Lacunar infarction    caudate   Major depressive disorder 12/15/2020   Polycythemia vera 10/16/2019   Renal cell cancer, right 10/16/2019   Right renal mass    Silent micro-hemorrhage of brain    right parietal   Syncope and collapse 12/16/2020   Vitamin B12 deficiency     Past Surgical History:  Procedure Laterality Date   COLONOSCOPY WITH ESOPHAGOGASTRODUODENOSCOPY (EGD)  07/2019   NECK SURGERY     ROBOTIC ASSITED PARTIAL NEPHRECTOMY Right 10/10/2019   Procedure: XI ROBOTIC ASSITED PARTIAL NEPHRECTOMY;  Surgeon: Ceasar Mons, MD;  Location: [facility];  Service: Urology;  Laterality: Right;   Current Outpatient Medications:    ASPIRIN 81 PO, Take 1 tablet by mouth daily., Disp: , Rfl:    atorvastatin (LIPITOR) 10 MG tablet, Take 1 tablet (10 mg total) by mouth daily., Disp: 90 tablet, Rfl: 3   Cyanocobalamin (B-12 PO), Take 1 tablet by mouth daily. Unknown strength, Disp: , Rfl:    hydroxyurea (HYDREA) 500 MG capsule, TAKE 1 CAPSULE BY MOUTH TWICE DAILY WITH FOOD TO  MINIMIZE  GI  SIDE  EFFECTS, Disp: 60 capsule, Rfl: 0  Clinical Interview:   The following information was obtained during a clinical interview with Mr. Broadwater and his wife prior to cognitive testing. Of note, medical records stated that an interpreter was not requested. As such, none was present during the interview.   Cognitive Symptoms: Decreased short-term memory: Endorsed. During the current interview, he described difficulties misplacing/losing things, as well as trouble remembering conversations he has had with others. He further noted that he will go "totally blank" while in the middle of various projects or other activities and have some trouble returning to the task at hand. He described an incident two years ago where he went blank while driving back from [place] and stopped on the road due to being unsure of where he was. Medical records also suggest concerns surrounding him repeating himself more often. Memory dysfunction was said to be present for at least the past 1-2 years. He was unsure if there had been decline over that time; his wife did express concerns surrounding mild, gradual decline.  Decreased long-term memory: Denied. Decreased attention/concentration: Endorsed. Perhaps his most prominent concern was an inability to focus and notably increased ease  of distractibility. He also reported having greater difficulty performing mental calculations and trouble operating electronic devices like his phone.  Reduced processing speed:  Endorsed. He reported experiencing brain fog, especially during instances where he blanks out and has trouble focusing.  Difficulties with executive functions: Endorsed in that he reported greater difficulty with organization. He denied trouble with impulsivity. Both he and his wife denied any significant personality changes.  Difficulties with emotion regulation: Denied. Difficulties with receptive language: Endorsed. He and his wife reported greater difficulty understanding what is being said to him. They were unsure of the cause for this and no hearing-related difficulties were reported.  Difficulties with word finding: Endorsed. Decreased visuoperceptual ability: Denied.  Difficulties completing ADLs: Somewhat. He denied trouble with medication management or driving-related pursuits. He did report some trouble with financial management and bill paying, stating that he has forgotten to pay a few bills in the past. He stated that he feels scared that he has not set up a payment correctly or is perhaps forgetting something else that needs to be paid. He also described some fear surrounding going blank while driving again.   Additional Medical History: History of traumatic brain injury/concussion: Denied. History of stroke: Prior neuroimaging suggested a remote lacunar infarct in the right caudate, as well as occasional micro-hemorrhaging in the right parietal lobe. These were likely asymptomatic experiences and the cause is unknown.  History of seizure activity: Denied. History of known exposure to toxins: Denied. Symptoms of chronic pain: Largely denied. However, he did describe some longstanding lower back pain stemming from an injury 30 years ago.  Experience of frequent headaches/migraines: Denied.  Frequent instances of dizziness/vertigo: Endorsed. While the overall frequency was unclear and hard to predict, he did report some trouble with dizziness and lightheadedness. He also noted  experiencing a pressurized sensation in the frontal lobes of unknown origin. No specific triggers were for this pressure were identified outside of perhaps intentional focus.   Sensory changes: He wears glasses with benefit. He and his wife were unsure regarding a potential change in his sense of taste. He denied trouble with hearing or smell.  Balance/coordination difficulties: Denied. Other motor difficulties: Denied.  Sleep History: Estimated hours obtained each night: 8 hours.  Difficulties falling asleep: Denied. Difficulties staying asleep: Denied. Feels rested and refreshed upon awakening: Endorsed.  History of snoring: Denied. History of waking up gasping for air: Denied. Witnessed breath cessation while asleep: Denied.  History of vivid dreaming: Denied. Excessive movement while asleep: Denied. Instances of acting out his dreams: Denied.  Psychiatric/Behavioral Health History: Depression: He reported occasional bouts or experiences of depressive symptoms and there is mention of a prior diagnosis of major depressive disorder in his medical records. His wife noted that had at one point been prescribed an anti-depressant medication. However, this was eventually discontinued and he is not currently on any related treatment. Current or remote suicidal ideation, intent, or plan was denied.  Anxiety: Endorsed. He and his wife acknowledged longstanding difficulties with generalized anxiety. Lately however, these symptoms have been exacerbated due to concerns surrounding cognitive decline. His wife also added that he seems to become far more anxious when they travel or there is a disruption to his routine. He is not currently taking any anti-anxiety medications.  Mania: Denied. Trauma History: Denied. Visual/auditory hallucinations: Denied. Delusional thoughts: Denied.  Tobacco: Denied. Alcohol: He denied current alcohol consumption as well as a history of problematic alcohol abuse or  dependence.  Recreational drugs: Denied.  Family History:  Problem Relation Age of Onset   Memory loss Brother    Memory loss Paternal Uncle    Colon cancer Neg Hx    Esophageal cancer Neg Hx    This information was confirmed by Mr. [place].  Academic/Vocational History: Highest level of educational attainment: 16 years. He was born and raised in [place] before moving to [place], [place] approximately 30 years ago. He completed a 4-year college degree while in [place] and described himself as a strong, Dispensing optician in academic settings. His primary language is Urdu. However, he is able to communicate well in [place].  History of developmental delay: Denied. History of grade repetition: Denied. Enrollment in special education courses: Denied. History of LD/ADHD: Denied.  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   Evaluation Results:   Behavioral Observations: Mr. Towery was accompanied by his wife, arrived to his appointment on time, and was appropriately dressed and groomed. He appeared alert and oriented. Observed gait and station were within normal limits. Gross motor functioning appeared intact upon informal observation and no abnormal movements (e.g., tremors) were noted. His affect was positive. However, he did express some anxiety surrounding his cognitive abilities and upcoming testing procedures. He also reported experiencing a pressurized sensation in his frontal lobes which seemed to worsen as the interview progressed. Spontaneous speech was fluent and word finding difficulties were not observed during the clinical interview. Thought processes were coherent, organized, and normal in content. Insight into his cognitive difficulties appeared largely adequate.   During testing, Mr. Koelling experienced significant anxiety to the point were several breaks had to be taken due to him feeling overwhelmed. This anxiety eventually peaked where he requested  that the evaluation be discontinued prematurely due to the significance of these symptoms. As such, the current evaluation was abbreviated in response. Generally speaking, sustained attention was appropriate. Task engagement was adequate and he persisted when challenged. He required the repetition of instructions across several tasks, generally those more complex in nature. Overall, Mr. Mcglone was cooperative with the clinical interview and subsequent testing procedures which were completed.   Adequacy of Effort: The validity of neuropsychological testing is limited by the extent to which the individual being tested may be assumed to have exerted adequate effort during testing. Mr. Eatherly expressed his intention to perform to the best of his abilities and exhibited adequate task engagement and persistence. Scores across stand-alone and embedded performance validity measures were within expectation. As such, the results of the current evaluation are believed to be a valid representation of Mr. Defoor's current cognitive functioning.  Test Results: Mr. Arizola was poorly oriented at the time of the current evaluation. He incorrectly stated his age ("73") and was unable to provide his address. He also was unable to provide the current year ("2020"), month, time, or location.  Intellectual abilities based upon educational and vocational attainment were estimated to be in the average range. Premorbid abilities were unable to be estimated utilizing a single-word reading test due to language confounds. Performance on a combination of visual-based WAIS-IV subtests attempting to calculate a short-form IQ scored in the exceptionally low range.    Processing speed was exceptionally low. Basic attention was well below average. More complex attention (e.g., working memory) was unable to be assessed. Assessed executive functioning (i.e., cognitive flexibility) was exceptionally low.  Assessed receptive language  abilities were unable to be assessed. Assessed expressive language was mildly variable. Phonemic fluency was below average while semantic fluency and confrontation  naming was exceptionally low.     Assessed visuospatial/visuoconstructional abilities were exceptionally low. Points were lost on his drawing of a clock due to him placing the numbers outside of the circle, as well as being confused and unable to place the clock hands. Points were lost on his copy of a complex figure due to very poor attention to detail in that he fully omitted all external aspects on both sides of the figure.    Learning (i.e., encoding) of novel verbal information was exceptionally low to well below average. Spontaneous delayed recall (i.e., retrieval) of previously learned information was exceptionally low. Retention rates were 0% across a story learning task, 0% across a list learning task, and 0% across a figure drawing task. Performance across recognition tasks was exceptionally low to well below average, suggesting minimal evidence for information consolidation.   Results of emotional screening instruments suggested that recent symptoms of generalized anxiety were in the moderate range, while symptoms of depression were within the mild range. A screening instrument assessing recent sleep quality suggested the presence of minimal sleep dysfunction.  Tables of Scores:   Note: This summary of test scores accompanies the interpretive report and should not be considered in isolation without reference to the appropriate sections in the text. Descriptors are based on appropriate normative data and may be adjusted based on clinical judgment. Terms such as "Within Normal Limits" and "Outside Normal Limits" are used when a more specific description of the test score cannot be determined.       Percentile - Normative Descriptor > 98 - Exceptionally High 91-97 - Well Above Average 75-90 - Above Average 25-74 - Average 9-24 -  Below Average 2-8 - Well Below Average < 2 - Exceptionally Low       Validity:   DESCRIPTOR       Dot Counting Test: --- --- Outside Normal Limits  RBANS Effort Index: --- --- Outside Normal Limits       Orientation:      Raw Score Percentile   NAB Orientation, Form 1 22/29 --- ---       Cognitive Screening:      Raw Score Percentile   SLUMS: 8/30 --- ---       RBANS, Form A: Standard Score/ Scaled Score Percentile   Total Score 46 <1 Exceptionally Low  Immediate Memory 53 <1 Exceptionally Low    List Learning 4 2 Well Below Average    Story Memory 1 <1 Exceptionally Low  Visuospatial/Constructional 56 <1 Exceptionally Low    Figure Copy 2 <1 Exceptionally Low    Line Orientation 6/20 <2 Exceptionally Low  Language 44 <1 Exceptionally Low    Picture Naming 5/10 <2 Exceptionally Low    Semantic Fluency 1 <1 Exceptionally Low  Attention 49 <1 Exceptionally Low    Digit Span 4 2 Well Below Average    Coding 1 <1 Exceptionally Low  Delayed Memory 48 <1 Exceptionally Low    List Recall 0/10 <2 Exceptionally Low    List Recognition 15/20 <2 Exceptionally Low    Story Recall 1 <1 Exceptionally Low    Story Recognition 5/12 1-2 Exceptionally Low    Figure Recall 1 <1 Exceptionally Low    Figure Recognition 2/8 2-3 Well Below Average       Intellectual Functioning:      Standard Score Percentile   Barona Formula Estimated Premorbid IQ 106 66 Average        Standard Score Percentile   Test of  Premorbid Functioning: Not attempted --- ---       Wechsler Adult Intelligence Scale (WAIS-IV) Short Form*: Standard Score/ Scaled Score Percentile   Full Scale IQ  52 <1 Exceptionally Low    Block Design 2 <1 Exceptionally Low    Matrix Reasoning 1 <1 Exceptionally Low  *From [employer] (2009)          Attention/Executive Function:     Trail Making Test (TMT): Raw Score (T Score) Percentile     Part A 184 secs.,  1 error (13) <1 Exceptionally Low    Part B DC'D @ 300 secs.  --- Impaired        D-KEFS Verbal Fluency Test: Raw Score (Scaled Score) Percentile   Letter Total Correct 26 (7) 16 Below Average    Category Total Correct 8 (1) <1 Exceptionally Low    Category Switching Total Correct 2 (1) <1 Exceptionally Low    Category Switching Accuracy 1 (1) <1 Exceptionally Low      Total Set Loss Errors 3 (9) 37 Average      Total Repetition Errors 1 (12) 75 Above Average       Visuospatial/Visuoconstruction:      Raw Score Percentile   Clock Drawing: 4/10 --- Impaired       Mood and Personality:      Raw Score Percentile   PROMIS Depression Questionnaire: 17 --- Mild  PROMIS Anxiety Questionnaire: 22 --- Moderate       Additional Questionnaires:      Raw Score Percentile   PROMIS Sleep Disturbance Questionnaire: 9 --- None to Slight   Informed Consent and Coding/Compliance:   The current evaluation represents a clinical evaluation for the purposes previously outlined by the referral source and is in no way reflective of a forensic evaluation.   Mr. Gens was provided with a verbal description of the nature and purpose of the present neuropsychological evaluation. Also reviewed were the foreseeable risks and/or discomforts and benefits of the procedure, limits of confidentiality, and mandatory reporting requirements of this provider. The patient was given the opportunity to ask questions and receive answers about the evaluation. Oral consent to participate was provided by the patient.   This evaluation was conducted by Christia Reading, Ph.D., licensed clinical neuropsychologist. Mr. Ashby completed a clinical interview with Dr. Melvyn Novas, billed as one unit [phone], and 105 minutes of cognitive testing and scoring, billed as one unit [phone] and three additional units [phone]. Psychometrist Milana Kidney, B.S., assisted Dr. Melvyn Novas with test administration and scoring procedures. As a separate and discrete service, Dr. Melvyn Novas spent a total of 120 minutes in  interpretation and report writing billed as one unit [phone] and one unit [phone].

## 2021-04-08 LAB — AST: AST: 24 IU/L (ref 0–40)

## 2021-04-08 LAB — LIPID PANEL
Chol/HDL Ratio: 4.2 ratio (ref 0.0–5.0)
Cholesterol, Total: 161 mg/dL (ref 100–199)
HDL: 38 mg/dL — ABNORMAL LOW (ref >39)
LDL Chol Calc (NIH): 97 mg/dL (ref 0–99)
Triglycerides: 144 mg/dL (ref 0–149)
VLDL Cholesterol Cal: 26 mg/dL (ref 5–40)

## 2021-04-08 LAB — ALT: ALT: 20 IU/L (ref 0–44)

## 2021-04-13 ENCOUNTER — Telehealth: Payer: Self-pay

## 2021-04-13 ENCOUNTER — Telehealth: Payer: Self-pay | Admitting: Cardiology

## 2021-04-13 NOTE — Telephone Encounter (Signed)
-----   Message from Park Liter, MD sent at 04/12/2021  2:26 PM EDT ----- Liver function test abnormal, cholesterol better.  Continue present management

## 2021-04-13 NOTE — Telephone Encounter (Signed)
Called patient. Patient made aware of the lab results. Verbalized understanding. No questions or concerns expressed at this time.

## 2021-04-13 NOTE — Telephone Encounter (Signed)
Patient called in asking that you put the information in my chart for him. He states its easier for him. Please advise

## 2021-04-14 ENCOUNTER — Other Ambulatory Visit: Payer: Self-pay

## 2021-04-14 ENCOUNTER — Ambulatory Visit (HOSPITAL_COMMUNITY)
Admission: RE | Admit: 2021-04-14 | Discharge: 2021-04-14 | Disposition: A | Discharge status: Home / Self Care | Payer: 59 | Source: Ambulatory Visit | Attending: Urology | Admitting: Urology

## 2021-04-14 ENCOUNTER — Other Ambulatory Visit (HOSPITAL_COMMUNITY): Payer: Self-pay | Admitting: Urology

## 2021-04-14 DIAGNOSIS — C641 Malignant neoplasm of right kidney, except renal pelvis: Secondary | ICD-10-CM

## 2021-04-21 ENCOUNTER — Ambulatory Visit (INDEPENDENT_AMBULATORY_CARE_PROVIDER_SITE_OTHER): Payer: 59 | Admitting: Psychology

## 2021-04-21 ENCOUNTER — Other Ambulatory Visit: Payer: Self-pay

## 2021-04-21 DIAGNOSIS — G3184 Mild cognitive impairment, so stated: Secondary | ICD-10-CM | POA: Diagnosis not present

## 2021-04-21 DIAGNOSIS — F411 Generalized anxiety disorder: Secondary | ICD-10-CM

## 2021-04-21 NOTE — Progress Notes (Signed)
   Neuropsychology Feedback Session [name]. [place] [facility] of Neurology  Reason for Referral:   Thomas Hancock is a 65 y.o. right-handed male referred by Ellouise Newer, M.D., to characterize his current cognitive functioning and assist with diagnostic clarity and treatment planning in the context of subjective cognitive decline.   Feedback:   [name] completed a comprehensive neuropsychological evaluation on 03/31/2021. Please refer to that encounter for the full report and recommendations. During testing, [name] experienced significant anxiety to the point were several breaks had to be taken due to him feeling overwhelmed. This anxiety eventually peaked where he requested that the evaluation be discontinued prematurely due to overall symptom significance. As such, the current evaluation was abbreviated in response. Additionally of note, testing was completed in [place] rather than Urdu (his native language) due to the unavailability of a Optometrist. Given the extent of experienced anxiety and language factors described above, it is reasonable to assume that these negatively impacted performance across tasks that were completed to an unknown extent. As such, the results of the evaluation should be interpreted with caution and lower scores likely underestimate his true abilities to an unknown degree. While I cannot rule out a neurological cause for reported day-to-day dysfunction, testing did not yield strongly valid data points to express more pronounced concerns at the present time.   [name] was accompanied by his wife during the current feedback appointment. Content of the current session focused on the results of his neuropsychological evaluation. [name] was given the opportunity to ask questions and his questions were answered. He was encouraged to reach out should additional questions arise. A copy of his report was provided at the conclusion of  the visit.      30 minutes were spent conducting the current feedback session with [name], billed as one unit [phone].

## 2021-04-26 ENCOUNTER — Other Ambulatory Visit: Payer: Self-pay | Admitting: Hematology & Oncology

## 2021-04-26 NOTE — Telephone Encounter (Signed)
Received an request to refill Hydroxyurea 500 mg #60 BID. Last OV 03/10/21 and next scheduled 05/12/21. Please advise if ok to refill, thank you!

## 2021-05-11 ENCOUNTER — Telehealth: Payer: Self-pay | Admitting: Psychology

## 2021-05-11 NOTE — Telephone Encounter (Signed)
I have called patient several times to get him sch and can not reach patient  ===View-only below this line=== ----- Message ----- From: Hazle Coca, PhD Sent: 05/05/2021   8:16 AM EST To: Jan Fireman, *  Thanks. He is on the list of [facility] insurance patients that are no longer going to be in-network with [facility]. They may want to do repeat testing somewhere else if insurance isn't going to cover a future eval.   ----- Message ----- From: Virl Son Sent: 05/04/2021   3:40 PM EST To: Tresea Mall, PhD, *  LMOM  again today  ----- Message ----- From: Hazle Coca, PhD Sent: 04/21/2021   2:43 PM EST To: Heywood Iles  Can we call and schedule him for repeat testing in 12 months? And lets make a note on the record that they would like an Database administrator for that testing appointment just as a reminder. Thanks!

## 2021-05-12 ENCOUNTER — Other Ambulatory Visit: Payer: Self-pay

## 2021-05-12 ENCOUNTER — Telehealth: Payer: Self-pay | Admitting: *Deleted

## 2021-05-12 ENCOUNTER — Encounter: Payer: Self-pay | Admitting: Hematology & Oncology

## 2021-05-12 ENCOUNTER — Inpatient Hospital Stay: Payer: 59 | Attending: Family

## 2021-05-12 ENCOUNTER — Inpatient Hospital Stay: Payer: 59

## 2021-05-12 ENCOUNTER — Inpatient Hospital Stay (HOSPITAL_BASED_OUTPATIENT_CLINIC_OR_DEPARTMENT_OTHER): Payer: 59 | Admitting: Hematology & Oncology

## 2021-05-12 VITALS — BP 114/88 | HR 86 | Temp 99.3°F | Resp 18 | Wt 189.0 lb

## 2021-05-12 DIAGNOSIS — R109 Unspecified abdominal pain: Secondary | ICD-10-CM | POA: Insufficient documentation

## 2021-05-12 DIAGNOSIS — Z79899 Other long term (current) drug therapy: Secondary | ICD-10-CM | POA: Diagnosis not present

## 2021-05-12 DIAGNOSIS — D45 Polycythemia vera: Secondary | ICD-10-CM | POA: Insufficient documentation

## 2021-05-12 DIAGNOSIS — C641 Malignant neoplasm of right kidney, except renal pelvis: Secondary | ICD-10-CM | POA: Insufficient documentation

## 2021-05-12 LAB — CMP (CANCER CENTER ONLY)
ALT: 27 U/L (ref 0–44)
AST: 27 U/L (ref 15–41)
Albumin: 4.6 g/dL (ref 3.5–5.0)
Alkaline Phosphatase: 79 U/L (ref 38–126)
Anion gap: 8 (ref 5–15)
BUN: 17 mg/dL (ref 8–23)
CO2: 29 mmol/L (ref 22–32)
Calcium: 9.9 mg/dL (ref 8.9–10.3)
Chloride: 103 mmol/L (ref 98–111)
Creatinine: 1 mg/dL (ref 0.61–1.24)
GFR, Estimated: >60 mL/min (ref >60)
Glucose, Bld: 129 mg/dL — ABNORMAL HIGH (ref 70–99)
Potassium: 4.4 mmol/L (ref 3.5–5.1)
Sodium: 140 mmol/L (ref 135–145)
Total Bilirubin: 0.9 mg/dL (ref 0.3–1.2)
Total Protein: 7.9 g/dL (ref 6.5–8.1)

## 2021-05-12 LAB — CBC WITH DIFFERENTIAL (CANCER CENTER ONLY)
Abs Immature Granulocytes: 0.02 10*3/uL (ref 0.00–0.07)
Basophils Absolute: 0.1 10*3/uL (ref 0.0–0.1)
Basophils Relative: 2 %
Eosinophils Absolute: 0.2 10*3/uL (ref 0.0–0.5)
Eosinophils Relative: 3 %
HCT: 46 % (ref 39.0–52.0)
Hemoglobin: 15.6 g/dL (ref 13.0–17.0)
Immature Granulocytes: 0 %
Lymphocytes Relative: 21 %
Lymphs Abs: 1.2 10*3/uL (ref 0.7–4.0)
MCH: 32.3 pg (ref 26.0–34.0)
MCHC: 33.9 g/dL (ref 30.0–36.0)
MCV: 95.2 fL (ref 80.0–100.0)
Monocytes Absolute: 0.5 10*3/uL (ref 0.1–1.0)
Monocytes Relative: 9 %
Neutro Abs: 3.8 10*3/uL (ref 1.7–7.7)
Neutrophils Relative %: 65 %
Platelet Count: 292 10*3/uL (ref 150–400)
RBC: 4.83 MIL/uL (ref 4.22–5.81)
RDW: 12.4 % (ref 11.5–15.5)
WBC Count: 5.8 10*3/uL (ref 4.0–10.5)
nRBC: 0 % (ref 0.0–0.2)

## 2021-05-12 LAB — IRON AND TIBC
Iron: 78 ug/dL (ref 42–163)
Saturation Ratios: 20 % (ref 20–55)
TIBC: 390 ug/dL (ref 202–409)
UIBC: 312 ug/dL (ref 117–376)

## 2021-05-12 LAB — FERRITIN: Ferritin: 15 ng/mL — ABNORMAL LOW (ref 24–336)

## 2021-05-12 LAB — TSH: TSH: 0.443 u[IU]/mL (ref 0.320–4.118)

## 2021-05-12 NOTE — Telephone Encounter (Signed)
Per 05/12/21 los gave upcoming appointments - confirmed

## 2021-05-12 NOTE — Progress Notes (Signed)
Hematology and Oncology Follow Up Visit  Thomas Hancock [id number] 05-20-1956 65 y.o. 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  He comes in with his wife.  He just got back from [place].  They go down to visit their grandchildren.  There was have a wonderful time down there.  A week ago, he saw his urologist for the follow-up for his right renal cell carcinoma.  He had a CT scan and chest x-ray.  They report these are okay.  They do not have to see the urologist for 1 year.  The urologist is checking his testosterone level.  As far as the hemochromatosis concerned, his iron studies back in September showed a ferritin of 26 with an iron saturation of 27%.  There has been no problems with headaches.  He has had some memory if issues.  Will be interesting to see what his testosterone level is.  He has had no change in bowel or bladder habits.  He has had no cough or shortness of breath.  There has been no nausea or vomiting.  He has had no rashes.  There has been no leg swelling.  He has had no problems with weight loss or weight gain.  Overall, his performance status is ECOG 1.       Medications:  Allergies as of 05/12/2021   No Known Allergies      Medication List        Accurate as of May 12, 2021 10:30 AM. If you have any questions, ask your nurse or doctor.          ASPIRIN 81 PO Take 1 tablet by mouth daily.   atorvastatin 10 MG tablet Commonly known as: LIPITOR Take 1 tablet (10 mg total) by mouth daily.   B-12 PO Take 1 tablet by mouth daily. Unknown strength    hydroxyurea 500 MG capsule Commonly known as: HYDREA TAKE 1 CAPSULE BY MOUTH TWICE DAILY WITH FOOD TO  MINIMIZE  GI  SIDE  EFFECTS        Allergies: No Known Allergies  Past Medical History, Surgical history, Social history, and Family History were reviewed and updated.  Review of Systems: Review of Systems  Constitutional: Negative.   HENT: Negative.    Eyes: Negative.   Respiratory: Negative.    Cardiovascular: Negative.   Gastrointestinal:  Positive for abdominal pain.  Genitourinary: Negative.   Musculoskeletal: Negative.   Skin: Negative.   Neurological: Negative.   Endo/Heme/Allergies: Negative.   Psychiatric/Behavioral: Negative.      Physical Exam:  weight is 189 lb (85.7 kg). His oral temperature is 99.3 F (37.4 C). His blood pressure is 114/88 and his pulse is 86. His respiration is 18 and oxygen saturation is 100%.   Wt Readings from Last 3 Encounters:  05/12/21 189 lb (85.7 kg)  03/10/21 189 lb 12.8 oz (86.1 kg)  02/16/21 188 lb (85.3 kg)    Physical Exam Vitals reviewed.  HENT:     Head: Normocephalic and atraumatic.  Eyes:     Pupils: Pupils are equal, round, and reactive to  light.  Cardiovascular:     Rate and Rhythm: Normal rate and regular rhythm.     Heart sounds: Normal heart sounds.  Pulmonary:     Effort: Pulmonary effort is normal.     Breath sounds: Normal breath sounds.  Abdominal:     General: Bowel sounds are normal.     Palpations: Abdomen is soft.     Comments: Abdominal exam shows healing laparoscopy scars.  I think he has 5 scars.  They are well-healed.  There is no erythema.  There is no exudate.  His abdomen is slightly distended.  He has slightly decreased bowel sounds.  There is no guarding.  He has no palpable abdominal mass.  Is no palpable liver or spleen tip.  Musculoskeletal:        General: No tenderness or deformity. Normal range of motion.     Cervical back: Normal range of motion.  Lymphadenopathy:     Cervical:  No cervical adenopathy.  Skin:    General: Skin is warm and dry.     Findings: No erythema or rash.  Neurological:     Mental Status: He is alert and oriented to person, place, and time.  Psychiatric:        Behavior: Behavior normal.        Thought Content: Thought content normal.        Judgment: Judgment normal.     Lab Results  Component Value Date   WBC 5.8 05/12/2021   HGB 15.6 05/12/2021   HCT 46.0 05/12/2021   MCV 95.2 05/12/2021   PLT 292 05/12/2021   Lab Results  Component Value Date   FERRITIN 26 03/10/2021   IRON 96 03/10/2021   TIBC 360 03/10/2021   UIBC 264 03/10/2021   IRONPCTSAT 27 03/10/2021   Lab Results  Component Value Date   RETICCTPCT 1.2 07/08/2020   RBC 4.83 05/12/2021   No results found for: KPAFRELGTCHN, LAMBDASER, KAPLAMBRATIO No results found for: IGGSERUM, IGA, IGMSERUM No results found for: Odetta Pink, SPEI   Chemistry      Component Value Date/Time   NA 140 05/12/2021 0927   K 4.4 05/12/2021 0927   CL 103 05/12/2021 0927   CO2 29 05/12/2021 0927   BUN 17 05/12/2021 0927   CREATININE 1.00 05/12/2021 0927      Component Value Date/Time   CALCIUM 9.9 05/12/2021 0927   ALKPHOS 79 05/12/2021 0927   AST 27 05/12/2021 0927   ALT 27 05/12/2021 0927   BILITOT 0.9 05/12/2021 0927       Impression and Plan: Thomas Hancock is a very pleasant 65 yo [name] with Polycythemia vera -  JAK 2 positive; 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  The renal cell carcinoma has been treated with surgery.  Thankfully he did not require a complete nephrectomy.  He is being followed by urology for this.  Again, he just was seen by them.  He does not need to be seen for another year.  I really do not think we have to phlebotomize him.  His hematocrit is only 46%.   Volanda Napoleon, MD 11/17/202210:30 AM

## 2021-05-13 LAB — TESTOSTERONE: Testosterone: 288 ng/dL (ref 264–916)

## 2021-06-23 ENCOUNTER — Ambulatory Visit (INDEPENDENT_AMBULATORY_CARE_PROVIDER_SITE_OTHER): Payer: 59 | Admitting: Family Medicine

## 2021-06-23 ENCOUNTER — Encounter: Payer: Self-pay | Admitting: Family Medicine

## 2021-06-23 VITALS — BP 110/80 | HR 75 | Temp 98.0°F | Resp 18 | Ht 72.0 in | Wt 191.4 lb

## 2021-06-23 DIAGNOSIS — F330 Major depressive disorder, recurrent, mild: Secondary | ICD-10-CM | POA: Diagnosis not present

## 2021-06-23 DIAGNOSIS — F411 Generalized anxiety disorder: Secondary | ICD-10-CM | POA: Diagnosis not present

## 2021-06-23 MED ORDER — SERTRALINE HCL 50 MG PO TABS: 50.0000 mg | ORAL_TABLET | Freq: Every day | ORAL | 1 refills | Status: DC

## 2021-06-23 MED ORDER — SERTRALINE HCL 50 MG PO TABS: 50.0000 mg | ORAL_TABLET | Freq: Every day | ORAL | 0 refills | Status: DC

## 2021-06-23 NOTE — Patient Instructions (Addendum)
Take 1/2 tab daily of the Zoloft for 2 weeks and then go back to 1 tab daily.   Aim to do some physical exertion for 150 minutes per week. This is typically divided into 5 days per week, 30 minutes per day. The activity should be enough to get your heart rate up. Anything is better than nothing if you have time constraints.  Please consider counseling. Contact [phone] to schedule an appointment or inquire about cost/insurance coverage.  [facility] located at [place], [place], [place].  Phone number = [phone].  Dr. Lennice Sites - Adult Psychiatry.    [facility] located at [place], [place], [place]. Phone number = [phone].   [facility] located at [address], [place], [place].  Phone number = [phone].   [facility] located at [place], [place], [place].  Phone number = [phone].  Coping skills Choose 5 that work for you: Take a deep breath Count to 20 Read a book Do a puzzle Meditate Bake Sing Knit Garden Pray Go outside Call a friend Listen to music Take a walk Color Send a note Take a bath Watch a movie Be alone in a quiet place Pet an animal Visit a friend Journal Exercise Stretch

## 2021-06-23 NOTE — Progress Notes (Signed)
Chief Complaint  Patient presents with   Anxiety    Pt says he just has a lot of fear and anxiety. He took depression medication last year. Pt says he felt like sxs were better controlled while on the medication.     Subjective Thomas Hancock presents for f/u anxiety/depression. Here w spouse.   He stopped taking Zoloft 50 mg/d 6 mo ago and things started getting worse with anxiety and depression.  No thoughts of harming self or others. No self-medication with alcohol, prescription drugs or illicit drugs. Pt is not following with a counselor/psychologist.  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   Renal cell cancer, right 10/16/2019   Right renal mass    Silent micro-hemorrhage of brain    right parietal   Syncope and collapse 12/16/2020   Vitamin B12 deficiency    Allergies as of 06/23/2021   No Known Allergies      Medication List        Accurate as of June 23, 2021  1:39 PM. If you have any questions, ask your nurse or doctor.          ASPIRIN 81 PO Take 1 tablet by mouth daily.   atorvastatin 10 MG tablet Commonly known as: LIPITOR Take 1 tablet (10 mg total) by mouth daily.   B-12 PO Take 1 tablet by mouth daily. Unknown strength   hydroxyurea 500 MG capsule Commonly known as: HYDREA TAKE 1 CAPSULE BY MOUTH TWICE DAILY WITH FOOD TO  MINIMIZE  GI  SIDE  EFFECTS   sertraline 50 MG tablet Commonly known as: ZOLOFT Take 1 tablet (50 mg total) by mouth daily. Take 1/2 tab daily for first 2 weeks. Started by: Shelda Pal, DO   sertraline 50 MG tablet Commonly known as: ZOLOFT Take 1 tablet (50 mg total) by  mouth daily. Start taking on: July 23, 2021 Started by: Shelda Pal, DO        Exam BP 110/80 (BP Location: Left Arm, Patient Position: Sitting, Cuff Size: Normal)    Pulse 75    Temp 98 F (36.7 C) (Oral)    Resp 18    Ht 6' (1.829 m)    Wt 191 lb 6.4 oz (86.8 kg)    SpO2 98%    BMI 25.96 kg/m  General:  well developed, well nourished, in no apparent distress Heart: RRR Lungs:  CTAB. No respiratory distress Psych: well oriented with normal range of affect and age-appropriate judgement/insight, alert and oriented x4.  Assessment and Plan  Mild episode of recurrent major depressive disorder (HCC)  Generalized anxiety disorder  Chronic, uncontrolled. Restart Zoloft 25 mg/d for 2 weeks and then increase to 50 mg/d. Counseled on exercise. Counseling info provided in paperwork. Anxiety coping techniques provided. F/u in 6 weeks. The patient and his spouse voiced understanding and agreement to the plan.  [place], DO 06/23/21 1:39 PM

## 2021-06-28 ENCOUNTER — Other Ambulatory Visit: Payer: Self-pay | Admitting: Hematology & Oncology

## 2021-07-19 ENCOUNTER — Encounter: Payer: Self-pay | Admitting: Family

## 2021-07-21 ENCOUNTER — Encounter: Payer: Self-pay | Admitting: Hematology & Oncology

## 2021-07-21 ENCOUNTER — Inpatient Hospital Stay (HOSPITAL_BASED_OUTPATIENT_CLINIC_OR_DEPARTMENT_OTHER): Payer: 59 | Admitting: Hematology & Oncology

## 2021-07-21 ENCOUNTER — Telehealth: Payer: Self-pay | Admitting: *Deleted

## 2021-07-21 ENCOUNTER — Inpatient Hospital Stay: Payer: 59

## 2021-07-21 ENCOUNTER — Inpatient Hospital Stay: Payer: 59 | Attending: Family

## 2021-07-21 ENCOUNTER — Other Ambulatory Visit: Payer: Self-pay

## 2021-07-21 VITALS — BP 102/85 | HR 70 | Temp 99.1°F | Resp 18 | Wt 195.0 lb

## 2021-07-21 DIAGNOSIS — D45 Polycythemia vera: Secondary | ICD-10-CM | POA: Insufficient documentation

## 2021-07-21 DIAGNOSIS — Z79899 Other long term (current) drug therapy: Secondary | ICD-10-CM | POA: Insufficient documentation

## 2021-07-21 DIAGNOSIS — C641 Malignant neoplasm of right kidney, except renal pelvis: Secondary | ICD-10-CM

## 2021-07-21 LAB — CMP (CANCER CENTER ONLY)
ALT: 22 U/L (ref 0–44)
AST: 23 U/L (ref 15–41)
Albumin: 4.7 g/dL (ref 3.5–5.0)
Alkaline Phosphatase: 91 U/L (ref 38–126)
Anion gap: 9 (ref 5–15)
BUN: 17 mg/dL (ref 8–23)
CO2: 27 mmol/L (ref 22–32)
Calcium: 9.9 mg/dL (ref 8.9–10.3)
Chloride: 103 mmol/L (ref 98–111)
Creatinine: 1.12 mg/dL (ref 0.61–1.24)
GFR, Estimated: >60 mL/min (ref >60)
Glucose, Bld: 152 mg/dL — ABNORMAL HIGH (ref 70–99)
Potassium: 3.9 mmol/L (ref 3.5–5.1)
Sodium: 139 mmol/L (ref 135–145)
Total Bilirubin: 0.6 mg/dL (ref 0.3–1.2)
Total Protein: 7.8 g/dL (ref 6.5–8.1)

## 2021-07-21 LAB — IRON AND IRON BINDING CAPACITY (CC-WL,HP ONLY)
Iron: 56 ug/dL (ref 45–182)
Saturation Ratios: 12 % — ABNORMAL LOW (ref 17.9–39.5)
TIBC: 462 ug/dL — ABNORMAL HIGH (ref 250–450)
UIBC: 406 ug/dL — ABNORMAL HIGH (ref 117–376)

## 2021-07-21 LAB — CBC WITH DIFFERENTIAL (CANCER CENTER ONLY)
Abs Immature Granulocytes: 0.03 10*3/uL (ref 0.00–0.07)
Basophils Absolute: 0.1 10*3/uL (ref 0.0–0.1)
Basophils Relative: 2 %
Eosinophils Absolute: 0.1 10*3/uL (ref 0.0–0.5)
Eosinophils Relative: 2 %
HCT: 47.1 % (ref 39.0–52.0)
Hemoglobin: 15.8 g/dL (ref 13.0–17.0)
Immature Granulocytes: 1 %
Lymphocytes Relative: 19 %
Lymphs Abs: 1.1 10*3/uL (ref 0.7–4.0)
MCH: 30.8 pg (ref 26.0–34.0)
MCHC: 33.5 g/dL (ref 30.0–36.0)
MCV: 91.8 fL (ref 80.0–100.0)
Monocytes Absolute: 0.5 10*3/uL (ref 0.1–1.0)
Monocytes Relative: 8 %
Neutro Abs: 4.1 10*3/uL (ref 1.7–7.7)
Neutrophils Relative %: 68 %
Platelet Count: 340 10*3/uL (ref 150–400)
RBC: 5.13 MIL/uL (ref 4.22–5.81)
RDW: 12.5 % (ref 11.5–15.5)
WBC Count: 6 10*3/uL (ref 4.0–10.5)
nRBC: 0 % (ref 0.0–0.2)

## 2021-07-21 LAB — LACTATE DEHYDROGENASE: LDH: 154 U/L (ref 98–192)

## 2021-07-21 LAB — FERRITIN: Ferritin: 14 ng/mL — ABNORMAL LOW (ref 24–336)

## 2021-07-21 NOTE — Patient Instructions (Signed)

## 2021-07-21 NOTE — Progress Notes (Signed)
Hematology and Oncology Follow Up Visit  Thomas Hancock [id number] October 02, 1955 66 y.o. 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  He comes in with his wife.  They made it through the holiday season.  That a wonderful time.  As always, they are down in [place] visiting their grandchildren.  There was enjoy going down there.  He has had no problems with respect to the hemochromatosis or the polycythemia.  He is on Hydrea.  His hematocrit is 47.1% today.  As such, we probably will have to phlebotomize him.  Her last iron studies back in November showed a ferritin of 15 with iron saturation of 20%.  He has had no issues with fever.  There is no cough or shortness of breath.  He has had no nausea or vomiting.  He has had no change in bowel or bladder habits.  He sees a urologist yearly for his history of kidney cancer of the right kidney.  Currently, his performance status is ECOG 1.    Medications:  Allergies as of 07/21/2021   No Known Allergies      Medication List        Accurate as of July 21, 2021 10:45 AM. If you have any questions, ask your nurse or doctor.          ASPIRIN 81 PO Take 1 tablet by mouth daily.   atorvastatin 10 MG tablet Commonly known as: LIPITOR Take 1 tablet (10 mg total) by mouth daily.   B-12 PO Take 1 tablet by mouth daily. Unknown strength   hydroxyurea 500 MG capsule Commonly known as: HYDREA TAKE 1 CAPSULE BY MOUTH TWICE DAILY WITH  FOOD  TO  MINIMIZE  GI  SIDE  EFFECTS   sertraline 50 MG tablet Commonly known as: ZOLOFT Take 1 tablet (50 mg total)  by mouth daily. Take 1/2 tab daily for first 2 weeks.   sertraline 50 MG tablet Commonly known as: ZOLOFT Take 1 tablet (50 mg total) by mouth daily. Start taking on: July 23, 2021        Allergies: No Known Allergies  Past Medical History, Surgical history, Social history, and Family History were reviewed and updated.  Review of Systems: Review of Systems  Constitutional: Negative.   HENT: Negative.    Eyes: Negative.   Respiratory: Negative.    Cardiovascular: Negative.   Gastrointestinal:  Positive for abdominal pain.  Genitourinary: Negative.   Musculoskeletal: Negative.   Skin: Negative.   Neurological: Negative.   Endo/Heme/Allergies: Negative.   Psychiatric/Behavioral: Negative.      Physical Exam:  weight is 195 lb (88.5 kg). His oral temperature is 99.1 F (37.3 C). His blood pressure is 102/85 and his pulse is 70. His respiration is 18 and oxygen saturation is 98%.   Wt Readings from Last 3 Encounters:  07/21/21 195 lb (88.5 kg)  06/23/21 191 lb 6.4 oz (86.8 kg)  05/12/21 189 lb (85.7 kg)    Physical Exam Vitals reviewed.  HENT:     Head: Normocephalic and atraumatic.  Eyes:     Pupils: Pupils  are equal, round, and reactive to light.  Cardiovascular:     Rate and Rhythm: Normal rate and regular rhythm.     Heart sounds: Normal heart sounds.  Pulmonary:     Effort: Pulmonary effort is normal.     Breath sounds: Normal breath sounds.  Abdominal:     General: Bowel sounds are normal.     Palpations: Abdomen is soft.     Comments: Abdominal exam shows healing laparoscopy scars.  I think he has 5 scars.  They are well-healed.  There is no erythema.  There is no exudate.  His abdomen is slightly distended.  He has slightly decreased bowel sounds.  There is no guarding.  He has no palpable abdominal mass.  Is no palpable liver or spleen tip.  Musculoskeletal:        General: No tenderness or deformity. Normal range of motion.     Cervical back: Normal  range of motion.  Lymphadenopathy:     Cervical: No cervical adenopathy.  Skin:    General: Skin is warm and dry.     Findings: No erythema or rash.  Neurological:     Mental Status: He is alert and oriented to person, place, and time.  Psychiatric:        Behavior: Behavior normal.        Thought Content: Thought content normal.        Judgment: Judgment normal.     Lab Results  Component Value Date   WBC 6.0 07/21/2021   HGB 15.8 07/21/2021   HCT 47.1 07/21/2021   MCV 91.8 07/21/2021   PLT 340 07/21/2021   Lab Results  Component Value Date   FERRITIN 15 (L) 05/12/2021   IRON 78 05/12/2021   TIBC 390 05/12/2021   UIBC 312 05/12/2021   IRONPCTSAT 20 05/12/2021   Lab Results  Component Value Date   RETICCTPCT 1.2 07/08/2020   RBC 5.13 07/21/2021   No results found for: KPAFRELGTCHN, LAMBDASER, KAPLAMBRATIO No results found for: IGGSERUM, IGA, IGMSERUM No results found for: Ronnald Ramp, A1GS, A2GS, Violet Baldy, MSPIKE, SPEI   Chemistry      Component Value Date/Time   NA 139 07/21/2021 0930   K 3.9 07/21/2021 0930   CL 103 07/21/2021 0930   CO2 27 07/21/2021 0930   BUN 17 07/21/2021 0930   CREATININE 1.12 07/21/2021 0930      Component Value Date/Time   CALCIUM 9.9 07/21/2021 0930   ALKPHOS 91 07/21/2021 0930   AST 23 07/21/2021 0930   ALT 22 07/21/2021 0930   BILITOT 0.6 07/21/2021 0930       Impression and Plan: Thomas Hancock is a very pleasant 66 yo [name] with Polycythemia vera -  JAK 2 positive; 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  Again, I think we will have to phlebotomize him.  His hematocrit is up a little bit at 47.1%.  It would be nice to get this down a little bit.  Again I do not worry about the hemochromatosis.  I have to believe that his iron studies will be okay.  Urology is following the kidney cancer.  They do his CT scans.  We will plan to get him back  in 3 months.  I think this would be a reasonable time for follow-up.    Volanda Napoleon, MD 1/26/202310:45 AM

## 2021-07-21 NOTE — Telephone Encounter (Signed)
Per 07/21/21 los - gave upcoming appointments - confirmed

## 2021-08-17 ENCOUNTER — Telehealth: Payer: Self-pay | Admitting: Cardiology

## 2021-08-17 NOTE — Telephone Encounter (Signed)
Called patient and new appointment scheduled for March 9th at 9:00 am

## 2021-08-17 NOTE — Telephone Encounter (Signed)
Called patient to find out which day he would like to schedule his appointment and he did not answer. Left message for him to call us back.

## 2021-08-17 NOTE — Telephone Encounter (Signed)
Follow Up:      Patient is returning Richard's call from today. 

## 2021-08-17 NOTE — Telephone Encounter (Signed)
Pt is needing to reschedule his appt.. next available is not until July.. pt would like to know if it is ok to wait this long... please advise

## 2021-08-18 ENCOUNTER — Ambulatory Visit: Payer: Self-pay | Admitting: Cardiology

## 2021-08-29 ENCOUNTER — Other Ambulatory Visit: Payer: Self-pay | Admitting: *Deleted

## 2021-08-29 MED ORDER — HYDROXYUREA 500 MG PO CAPS: ORAL_CAPSULE | ORAL | 3 refills | Status: DC

## 2021-09-01 ENCOUNTER — Ambulatory Visit: Payer: Self-pay | Admitting: Cardiology

## 2021-09-27 ENCOUNTER — Other Ambulatory Visit: Payer: Self-pay | Admitting: Family Medicine

## 2021-10-19 ENCOUNTER — Inpatient Hospital Stay: Payer: 59 | Admitting: Hematology & Oncology

## 2021-10-19 ENCOUNTER — Inpatient Hospital Stay: Payer: 59

## 2021-10-19 ENCOUNTER — Inpatient Hospital Stay: Payer: 59 | Attending: Hematology & Oncology

## 2021-10-21 ENCOUNTER — Encounter: Payer: Self-pay | Admitting: Hematology & Oncology

## 2021-10-29 ENCOUNTER — Other Ambulatory Visit: Payer: Self-pay | Admitting: Family Medicine

## 2021-11-17 ENCOUNTER — Inpatient Hospital Stay: Payer: 59 | Attending: Hematology & Oncology

## 2021-11-17 ENCOUNTER — Inpatient Hospital Stay: Payer: 59

## 2021-11-17 ENCOUNTER — Telehealth: Payer: Self-pay | Admitting: *Deleted

## 2021-11-17 ENCOUNTER — Inpatient Hospital Stay (HOSPITAL_BASED_OUTPATIENT_CLINIC_OR_DEPARTMENT_OTHER): Payer: 59 | Admitting: Hematology & Oncology

## 2021-11-17 ENCOUNTER — Encounter: Payer: Self-pay | Admitting: Hematology & Oncology

## 2021-11-17 ENCOUNTER — Other Ambulatory Visit: Payer: Self-pay

## 2021-11-17 VITALS — BP 98/75 | HR 86 | Temp 98.2°F | Resp 18 | Wt 196.0 lb

## 2021-11-17 DIAGNOSIS — Z85528 Personal history of other malignant neoplasm of kidney: Secondary | ICD-10-CM | POA: Insufficient documentation

## 2021-11-17 DIAGNOSIS — Z79899 Other long term (current) drug therapy: Secondary | ICD-10-CM | POA: Diagnosis not present

## 2021-11-17 DIAGNOSIS — C641 Malignant neoplasm of right kidney, except renal pelvis: Secondary | ICD-10-CM

## 2021-11-17 DIAGNOSIS — D45 Polycythemia vera: Secondary | ICD-10-CM | POA: Diagnosis not present

## 2021-11-17 LAB — CMP (CANCER CENTER ONLY)
ALT: 15 U/L (ref 0–44)
AST: 21 U/L (ref 15–41)
Albumin: 4.5 g/dL (ref 3.5–5.0)
Alkaline Phosphatase: 84 U/L (ref 38–126)
Anion gap: 10 (ref 5–15)
BUN: 20 mg/dL (ref 8–23)
CO2: 26 mmol/L (ref 22–32)
Calcium: 9.2 mg/dL (ref 8.9–10.3)
Chloride: 104 mmol/L (ref 98–111)
Creatinine: 1.18 mg/dL (ref 0.61–1.24)
GFR, Estimated: >60 mL/min (ref >60)
Glucose, Bld: 152 mg/dL — ABNORMAL HIGH (ref 70–99)
Potassium: 3.9 mmol/L (ref 3.5–5.1)
Sodium: 140 mmol/L (ref 135–145)
Total Bilirubin: 0.5 mg/dL (ref 0.3–1.2)
Total Protein: 7.8 g/dL (ref 6.5–8.1)

## 2021-11-17 LAB — CBC WITH DIFFERENTIAL (CANCER CENTER ONLY)
Abs Immature Granulocytes: 0.01 10*3/uL (ref 0.00–0.07)
Basophils Absolute: 0.1 10*3/uL (ref 0.0–0.1)
Basophils Relative: 2 %
Eosinophils Absolute: 0.2 10*3/uL (ref 0.0–0.5)
Eosinophils Relative: 3 %
HCT: 44.2 % (ref 39.0–52.0)
Hemoglobin: 14.4 g/dL (ref 13.0–17.0)
Immature Granulocytes: 0 %
Lymphocytes Relative: 20 %
Lymphs Abs: 1.3 10*3/uL (ref 0.7–4.0)
MCH: 29 pg (ref 26.0–34.0)
MCHC: 32.6 g/dL (ref 30.0–36.0)
MCV: 88.9 fL (ref 80.0–100.0)
Monocytes Absolute: 0.5 10*3/uL (ref 0.1–1.0)
Monocytes Relative: 8 %
Neutro Abs: 4.3 10*3/uL (ref 1.7–7.7)
Neutrophils Relative %: 67 %
Platelet Count: 362 10*3/uL (ref 150–400)
RBC: 4.97 MIL/uL (ref 4.22–5.81)
RDW: 13.2 % (ref 11.5–15.5)
WBC Count: 6.4 10*3/uL (ref 4.0–10.5)
nRBC: 0 % (ref 0.0–0.2)

## 2021-11-17 LAB — IRON AND IRON BINDING CAPACITY (CC-WL,HP ONLY)
Iron: 45 ug/dL (ref 45–182)
Saturation Ratios: 10 % — ABNORMAL LOW (ref 17.9–39.5)
TIBC: 454 ug/dL — ABNORMAL HIGH (ref 250–450)
UIBC: 409 ug/dL — ABNORMAL HIGH (ref 117–376)

## 2021-11-17 LAB — FERRITIN: Ferritin: 9 ng/mL — ABNORMAL LOW (ref 24–336)

## 2021-11-17 NOTE — Telephone Encounter (Signed)
Per 11/17/21 los - gave upcoming appointments - confirmed

## 2021-11-17 NOTE — Progress Notes (Signed)
Hematology and Oncology Follow Up Visit  Thomas Hancock [id number] Feb 11, 1956 66 y.o. 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  Thomas Hancock and Thomas Hancock wife just got back from [place].  They have a grandson down there who had a second birthday.  The office Thomas Hancock had a good time.  Thomas Hancock is feeling okay.  Thomas Hancock really has had no complaints.  There is been no problems with dizziness.  Thomas Hancock has had no issues with finding the right word.  Thomas Hancock last iron studies back in January showed a ferritin of 14 with an iron saturation of 12%.  As such, we have not had to phlebotomize him.  Thomas Hancock is doing well on the Hydrea.  Thomas Hancock blood counts look great today.  Thomas Hancock has had no fever.  There is no cough or shortness of breath.  Thomas Hancock has had no nausea or vomiting.  Thomas Hancock has had no change in bowel or bladder habits.  There has been no rashes.  Thomas Hancock has had no headache.  Overall, I would say Thomas Hancock performance status is probably ECOG 1.      Medications:  Allergies as of 11/17/2021   No Known Allergies      Medication List        Accurate as of Nov 17, 2021  9:40 AM. If you have any questions, ask your nurse or doctor.          ASPIRIN 81 PO Take 1 tablet by mouth daily.   atorvastatin 10 MG tablet Commonly known as: LIPITOR Take 1 tablet (10 mg total) by mouth daily.   B-12 PO Take 1 tablet by mouth daily. Unknown strength   hydroxyurea 500 MG capsule Commonly known as: HYDREA TAKE 1 CAPSULE BY MOUTH TWICE DAILY WITH  FOOD  TO  MINIMIZE  GI  SIDE  EFFECTS   sertraline 50 MG tablet Commonly known as: ZOLOFT Take 1 tablet (50 mg total)  by mouth daily.        Allergies: No Known Allergies  Past Medical History, Surgical history, Social history, and Family History were reviewed and updated.  Review of Systems: Review of Systems  Constitutional: Negative.   HENT: Negative.    Eyes: Negative.   Respiratory: Negative.    Cardiovascular: Negative.   Gastrointestinal:  Positive for abdominal pain.  Genitourinary: Negative.   Musculoskeletal: Negative.   Skin: Negative.   Neurological: Negative.   Endo/Heme/Allergies: Negative.   Psychiatric/Behavioral: Negative.      Physical Exam:  weight is 196 lb (88.9 kg). Thomas Hancock oral temperature is 98.2 F (36.8 C). Thomas Hancock blood pressure is 98/75 and Thomas Hancock pulse is 86. Thomas Hancock respiration is 18 and oxygen saturation is 100%.   Wt Readings from Last 3 Encounters:  11/17/21 196 lb (88.9 kg)  07/21/21 195 lb (88.5 kg)  06/23/21 191 lb 6.4 oz (86.8 kg)    Physical Exam Vitals reviewed.  HENT:     Head: Normocephalic and atraumatic.  Eyes:     Pupils: Pupils are equal, round, and reactive to light.  Cardiovascular:     Rate and Rhythm: Normal rate  and regular rhythm.     Heart sounds: Normal heart sounds.  Pulmonary:     Effort: Pulmonary effort is normal.     Breath sounds: Normal breath sounds.  Abdominal:     General: Bowel sounds are normal.     Palpations: Abdomen is soft.     Comments: Abdominal exam shows healing laparoscopy scars.  I think Thomas Hancock has 5 scars.  They are well-healed.  There is no erythema.  There is no exudate.  Thomas Hancock abdomen is slightly distended.  Thomas Hancock has slightly decreased bowel sounds.  There is no guarding.  Thomas Hancock has no palpable abdominal mass.  Is no palpable liver or spleen tip.  Musculoskeletal:        General: No tenderness or deformity. Normal range of motion.     Cervical back: Normal range of motion.  Lymphadenopathy:     Cervical: No cervical adenopathy.  Skin:    General: Skin is warm and dry.     Findings: No erythema or rash.  Neurological:      Mental Status: Thomas Hancock is alert and oriented to person, place, and time.  Psychiatric:        Behavior: Behavior normal.        Thought Content: Thought content normal.        Judgment: Judgment normal.     Lab Results  Component Value Date   WBC 6.4 11/17/2021   HGB 14.4 11/17/2021   HCT 44.2 11/17/2021   MCV 88.9 11/17/2021   PLT 362 11/17/2021   Lab Results  Component Value Date   FERRITIN 14 (L) 07/21/2021   IRON 56 07/21/2021   TIBC 462 (H) 07/21/2021   UIBC 406 (H) 07/21/2021   IRONPCTSAT 12 (L) 07/21/2021   Lab Results  Component Value Date   RETICCTPCT 1.2 07/08/2020   RBC 4.97 11/17/2021   No results found for: KPAFRELGTCHN, LAMBDASER, KAPLAMBRATIO No results found for: IGGSERUM, IGA, IGMSERUM No results found for: Odetta Pink, SPEI   Chemistry      Component Value Date/Time   NA 140 11/17/2021 0845   K 3.9 11/17/2021 0845   CL 104 11/17/2021 0845   CO2 26 11/17/2021 0845   BUN 20 11/17/2021 0845   CREATININE 1.18 11/17/2021 0845      Component Value Date/Time   CALCIUM 9.2 11/17/2021 0845   ALKPHOS 84 11/17/2021 0845   AST 21 11/17/2021 0845   ALT 15 11/17/2021 0845   BILITOT 0.5 11/17/2021 0845       Impression and Plan: Thomas Hancock is a very pleasant 66 yo [name] with Polycythemia vera -  JAK 2 positive; 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  Thomas Hancock does not need a phlebotomy today.  I am very happy about that.  I forgot to mention that the urologist will see him in I think August or September for Thomas Hancock kidney cancer.  They follow him up with Thomas Hancock CT scans.  We will try to get him through the Summer now.  Hopefully we will be able to do this.    Volanda Napoleon, MD 5/25/20239:40 AM

## 2021-11-28 ENCOUNTER — Other Ambulatory Visit: Payer: Self-pay | Admitting: Family Medicine

## 2021-12-28 ENCOUNTER — Other Ambulatory Visit: Payer: Self-pay | Admitting: Family Medicine

## 2022-01-05 ENCOUNTER — Ambulatory Visit: Payer: Self-pay | Admitting: Cardiology

## 2022-01-22 ENCOUNTER — Other Ambulatory Visit: Payer: Self-pay | Admitting: Family Medicine

## 2022-01-23 MED ORDER — SERTRALINE HCL 50 MG PO TABS: 50.0000 mg | ORAL_TABLET | Freq: Every day | ORAL | 0 refills | Status: DC

## 2022-02-16 ENCOUNTER — Other Ambulatory Visit: Payer: Self-pay | Admitting: Cardiology

## 2022-02-17 MED ORDER — ATORVASTATIN CALCIUM 10 MG PO TABS: 10.0000 mg | ORAL_TABLET | Freq: Every day | ORAL | 0 refills | Status: DC

## 2022-02-25 ENCOUNTER — Other Ambulatory Visit: Payer: Self-pay | Admitting: Family Medicine

## 2022-02-25 ENCOUNTER — Other Ambulatory Visit: Payer: Self-pay | Admitting: Cardiology

## 2022-02-28 ENCOUNTER — Other Ambulatory Visit: Payer: Self-pay | Admitting: Hematology & Oncology

## 2022-03-06 ENCOUNTER — Encounter: Payer: Self-pay | Admitting: Family

## 2022-03-09 ENCOUNTER — Inpatient Hospital Stay: Payer: Commercial Managed Care - HMO

## 2022-03-09 ENCOUNTER — Encounter: Payer: Self-pay | Admitting: Family

## 2022-03-09 ENCOUNTER — Other Ambulatory Visit: Payer: Self-pay

## 2022-03-09 ENCOUNTER — Encounter: Payer: Self-pay | Admitting: Hematology & Oncology

## 2022-03-09 ENCOUNTER — Inpatient Hospital Stay: Payer: Commercial Managed Care - HMO | Attending: Hematology & Oncology

## 2022-03-09 ENCOUNTER — Inpatient Hospital Stay (HOSPITAL_BASED_OUTPATIENT_CLINIC_OR_DEPARTMENT_OTHER): Payer: Commercial Managed Care - HMO | Admitting: Hematology & Oncology

## 2022-03-09 VITALS — BP 95/73 | HR 73 | Temp 98.3°F | Resp 18 | Ht 72.0 in | Wt 187.1 lb

## 2022-03-09 DIAGNOSIS — D45 Polycythemia vera: Secondary | ICD-10-CM | POA: Diagnosis not present

## 2022-03-09 DIAGNOSIS — C641 Malignant neoplasm of right kidney, except renal pelvis: Secondary | ICD-10-CM

## 2022-03-09 DIAGNOSIS — Z79899 Other long term (current) drug therapy: Secondary | ICD-10-CM | POA: Diagnosis not present

## 2022-03-09 LAB — CBC WITH DIFFERENTIAL (CANCER CENTER ONLY)
Abs Immature Granulocytes: 0.01 10*3/uL (ref 0.00–0.07)
Basophils Absolute: 0.1 10*3/uL (ref 0.0–0.1)
Basophils Relative: 2 %
Eosinophils Absolute: 0.2 10*3/uL (ref 0.0–0.5)
Eosinophils Relative: 3 %
HCT: 45.6 % (ref 39.0–52.0)
Hemoglobin: 15.1 g/dL (ref 13.0–17.0)
Immature Granulocytes: 0 %
Lymphocytes Relative: 21 %
Lymphs Abs: 1.2 10*3/uL (ref 0.7–4.0)
MCH: 30.2 pg (ref 26.0–34.0)
MCHC: 33.1 g/dL (ref 30.0–36.0)
MCV: 91.2 fL (ref 80.0–100.0)
Monocytes Absolute: 0.4 10*3/uL (ref 0.1–1.0)
Monocytes Relative: 7 %
Neutro Abs: 3.7 10*3/uL (ref 1.7–7.7)
Neutrophils Relative %: 67 %
Platelet Count: 369 10*3/uL (ref 150–400)
RBC: 5 MIL/uL (ref 4.22–5.81)
RDW: 13.5 % (ref 11.5–15.5)
WBC Count: 5.6 10*3/uL (ref 4.0–10.5)
nRBC: 0 % (ref 0.0–0.2)

## 2022-03-09 LAB — CMP (CANCER CENTER ONLY)
ALT: 15 U/L (ref 0–44)
AST: 22 U/L (ref 15–41)
Albumin: 4.7 g/dL (ref 3.5–5.0)
Alkaline Phosphatase: 74 U/L (ref 38–126)
Anion gap: 10 (ref 5–15)
BUN: 20 mg/dL (ref 8–23)
CO2: 24 mmol/L (ref 22–32)
Calcium: 9.5 mg/dL (ref 8.9–10.3)
Chloride: 106 mmol/L (ref 98–111)
Creatinine: 1.16 mg/dL (ref 0.61–1.24)
GFR, Estimated: >60 mL/min (ref >60)
Glucose, Bld: 168 mg/dL — ABNORMAL HIGH (ref 70–99)
Potassium: 3.8 mmol/L (ref 3.5–5.1)
Sodium: 140 mmol/L (ref 135–145)
Total Bilirubin: 0.8 mg/dL (ref 0.3–1.2)
Total Protein: 8.2 g/dL — ABNORMAL HIGH (ref 6.5–8.1)

## 2022-03-09 LAB — IRON AND IRON BINDING CAPACITY (CC-WL,HP ONLY)
Iron: 64 ug/dL (ref 45–182)
Saturation Ratios: 14 % — ABNORMAL LOW (ref 17.9–39.5)
TIBC: 455 ug/dL — ABNORMAL HIGH (ref 250–450)
UIBC: 391 ug/dL — ABNORMAL HIGH (ref 117–376)

## 2022-03-09 LAB — LACTATE DEHYDROGENASE: LDH: 142 U/L (ref 98–192)

## 2022-03-09 LAB — FERRITIN: Ferritin: 9 ng/mL — ABNORMAL LOW (ref 24–336)

## 2022-03-09 MED ORDER — SODIUM CHLORIDE 0.9 % IV SOLN: INTRAVENOUS | Status: DC

## 2022-03-09 NOTE — Progress Notes (Signed)
Thomas Hancock presents today for phlebotomy per MD orders. Phlebotomy procedure started at 1333 and ended at 1405. 400 grams removed via 20 gauge PIV to left forearm. Patient observed for 30 minutes after procedure without any incident while receiving IVF per MD orders. Patient tolerated procedure well. IV needle removed intact.

## 2022-03-09 NOTE — Patient Instructions (Signed)

## 2022-03-09 NOTE — Progress Notes (Signed)
Hematology and Oncology Follow Up Visit  Thomas Hancock [id number] [birth year] 66 y.o. 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 is here today for follow-up.  We last saw him back in May.  He has had a good summer.  As always, he and his wife are down in [place] seeing a grandson.  He is 89 years old.  He is doing well on the Hydrea.  He has had no problems with nausea or vomiting.  He has had no cough or shortness of breath.  He has had no change in bowel or bladder habits.  There is been no headache.  He has had occasional dizziness.  He has had no rashes.  His last iron studies back in May showed a ferritin of 9 with an iron saturation of 10%.  He has had no mouth sores.  Overall, I would say his performance status is probably ECOG 0.     Medications:  Allergies as of 03/09/2022   No Known Allergies      Medication List        Accurate as of March 09, 2022 12:53 PM. If you have any questions, ask your nurse or doctor.          ASPIRIN 81 PO Take 1 tablet by mouth daily.   atorvastatin 10 MG tablet Commonly known as: LIPITOR Take 1 tablet (10 mg total) by mouth daily. Needs appointment for future refills / 2nd attempt   B-12 PO Take 1 tablet by mouth daily. Unknown strength   hydroxyurea 500 MG capsule Commonly known as: HYDREA TAKE 1 CAPSULE BY MOUTH TWICE DAILY WITH MEALS TO  MINIMIZE  GI  SIDE  EFFECTS   sertraline 50 MG tablet Commonly known as: ZOLOFT Take 1 tablet by mouth once daily        Allergies: No Known Allergies  Past Medical History,  Surgical history, Social history, and Family History were reviewed and updated.  Review of Systems: Review of Systems  Constitutional: Negative.   HENT: Negative.    Eyes: Negative.   Respiratory: Negative.    Cardiovascular: Negative.   Gastrointestinal:  Positive for abdominal pain.  Genitourinary: Negative.   Musculoskeletal: Negative.   Skin: Negative.   Neurological: Negative.   Endo/Heme/Allergies: Negative.   Psychiatric/Behavioral: Negative.       Physical Exam:  height is 6' (1.829 m) and weight is 187 lb 1.9 oz (84.9 kg). His oral temperature is 98.3 F (36.8 C). His blood pressure is 95/73 and his pulse is 73. His respiration is 18 and oxygen saturation is 100%.   Wt Readings from Last 3 Encounters:  03/09/22 187 lb 1.9 oz (84.9 kg)  11/17/21 196 lb (88.9 kg)  07/21/21 195 lb (88.5 kg)    Physical Exam Vitals reviewed.  HENT:     Head: Normocephalic and atraumatic.  Eyes:     Pupils: Pupils are equal, round, and reactive to light.  Cardiovascular:     Rate and Rhythm: Normal rate and regular rhythm.     Heart sounds: Normal heart sounds.  Pulmonary:  Effort: Pulmonary effort is normal.     Breath sounds: Normal breath sounds.  Abdominal:     General: Bowel sounds are normal.     Palpations: Abdomen is soft.     Comments: Abdominal exam shows healing laparoscopy scars.  I think he has 5 scars.  They are well-healed.  There is no erythema.  There is no exudate.  His abdomen is slightly distended.  He has slightly decreased bowel sounds.  There is no guarding.  He has no palpable abdominal mass.  Is no palpable liver or spleen tip.  Musculoskeletal:        General: No tenderness or deformity. Normal range of motion.     Cervical back: Normal range of motion.  Lymphadenopathy:     Cervical: No cervical adenopathy.  Skin:    General: Skin is warm and dry.     Findings: No erythema or rash.  Neurological:     Mental Status: He is alert and oriented to  person, place, and time.  Psychiatric:        Behavior: Behavior normal.        Thought Content: Thought content normal.        Judgment: Judgment normal.      Lab Results  Component Value Date   WBC 5.6 03/09/2022   HGB 15.1 03/09/2022   HCT 45.6 03/09/2022   MCV 91.2 03/09/2022   PLT 369 03/09/2022   Lab Results  Component Value Date   FERRITIN 9 (L) 11/17/2021   IRON 45 11/17/2021   TIBC 454 (H) 11/17/2021   UIBC 409 (H) 11/17/2021   IRONPCTSAT 10 (L) 11/17/2021   Lab Results  Component Value Date   RETICCTPCT 1.2 07/08/2020   RBC 5.00 03/09/2022   No results found for: "KPAFRELGTCHN", "LAMBDASER", "KAPLAMBRATIO" No results found for: "IGGSERUM", "IGA", "IGMSERUM" No results found for: "TOTALPROTELP", "ALBUMINELP", "A1GS", "A2GS", "BETS", "BETA2SER", "GAMS", "MSPIKE", "SPEI"   Chemistry      Component Value Date/Time   NA 140 03/09/2022 1158   K 3.8 03/09/2022 1158   CL 106 03/09/2022 1158   CO2 24 03/09/2022 1158   BUN 20 03/09/2022 1158   CREATININE 1.16 03/09/2022 1158      Component Value Date/Time   CALCIUM 9.5 03/09/2022 1158   ALKPHOS 74 03/09/2022 1158   AST 22 03/09/2022 1158   ALT 15 03/09/2022 1158   BILITOT 0.8 03/09/2022 1158       Impression and Plan: [name] is a very pleasant 66 yo [name] with Polycythemia vera -  JAK 2 positive; 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  We will go ahead and phlebotomize him.  His hematocrit is little bit of 45%.  I just would feel better with him being phlebotomized to keep his hematocrit below 45%.  I am not going to make any change in his Hydrea dose.  I would like to think that the should see the urologist for his kidney cancer probably this month or next month.  The urologist does the CT scans.  I think we can probably get him back in about 3 more months.  If all looks good in 3 months, then we might be able to move his appointment out to 4  months.   Volanda Napoleon, MD 9/14/202312:53 PM

## 2022-03-19 ENCOUNTER — Other Ambulatory Visit: Payer: Self-pay | Admitting: Cardiology

## 2022-04-05 ENCOUNTER — Other Ambulatory Visit: Payer: Self-pay | Admitting: Cardiology

## 2022-04-05 ENCOUNTER — Other Ambulatory Visit: Payer: Self-pay | Admitting: Family Medicine

## 2022-04-05 MED ORDER — SERTRALINE HCL 50 MG PO TABS: 50.0000 mg | ORAL_TABLET | Freq: Every day | ORAL | 0 refills | Status: DC

## 2022-04-11 ENCOUNTER — Encounter: Payer: Self-pay | Admitting: Family Medicine

## 2022-04-11 ENCOUNTER — Ambulatory Visit (INDEPENDENT_AMBULATORY_CARE_PROVIDER_SITE_OTHER): Payer: Commercial Managed Care - HMO | Admitting: Family Medicine

## 2022-04-11 VITALS — BP 112/80 | HR 67 | Temp 98.0°F | Ht 72.0 in | Wt 188.1 lb

## 2022-04-11 DIAGNOSIS — Z125 Encounter for screening for malignant neoplasm of prostate: Secondary | ICD-10-CM

## 2022-04-11 DIAGNOSIS — Z23 Encounter for immunization: Secondary | ICD-10-CM | POA: Diagnosis not present

## 2022-04-11 DIAGNOSIS — Z0000 Encounter for general adult medical examination without abnormal findings: Secondary | ICD-10-CM | POA: Diagnosis not present

## 2022-04-11 LAB — COMPREHENSIVE METABOLIC PANEL
ALT: 16 U/L (ref 0–53)
AST: 22 U/L (ref 0–37)
Albumin: 4.6 g/dL (ref 3.5–5.2)
Alkaline Phosphatase: 85 U/L (ref 39–117)
BUN: 16 mg/dL (ref 6–23)
CO2: 28 mEq/L (ref 19–32)
Calcium: 9.3 mg/dL (ref 8.4–10.5)
Chloride: 104 mEq/L (ref 96–112)
Creatinine, Ser: 0.96 mg/dL (ref 0.40–1.50)
GFR: 83.48 mL/min (ref >60.00)
Glucose, Bld: 88 mg/dL (ref 70–99)
Potassium: 4.8 mEq/L (ref 3.5–5.1)
Sodium: 140 mEq/L (ref 135–145)
Total Bilirubin: 0.6 mg/dL (ref 0.2–1.2)
Total Protein: 7.7 g/dL (ref 6.0–8.3)

## 2022-04-11 LAB — LIPID PANEL
Cholesterol: 179 mg/dL (ref 0–200)
HDL: 38 mg/dL — ABNORMAL LOW (ref >39.00)
LDL Cholesterol: 111 mg/dL — ABNORMAL HIGH (ref 0–99)
NonHDL: 140.68
Total CHOL/HDL Ratio: 5
Triglycerides: 146 mg/dL (ref 0.0–149.0)
VLDL: 29.2 mg/dL (ref 0.0–40.0)

## 2022-04-11 LAB — CBC
HCT: 43.8 % (ref 39.0–52.0)
Hemoglobin: 14.1 g/dL (ref 13.0–17.0)
MCHC: 32.2 g/dL (ref 30.0–36.0)
MCV: 92.5 fl (ref 78.0–100.0)
Platelets: 421 10*3/uL — ABNORMAL HIGH (ref 150.0–400.0)
RBC: 4.73 Mil/uL (ref 4.22–5.81)
RDW: 14 % (ref 11.5–15.5)
WBC: 5.6 10*3/uL (ref 4.0–10.5)

## 2022-04-11 LAB — PSA: PSA: 1.52 ng/mL (ref 0.10–4.00)

## 2022-04-11 MED ORDER — SERTRALINE HCL 50 MG PO TABS: 50.0000 mg | ORAL_TABLET | Freq: Every day | ORAL | 2 refills | Status: DC

## 2022-04-11 MED ORDER — ATORVASTATIN CALCIUM 10 MG PO TABS: ORAL_TABLET | ORAL | 2 refills | Status: DC

## 2022-04-11 NOTE — Progress Notes (Signed)
Chief Complaint  Patient presents with   Annual Exam    Headache for last 3 to 4 days Refill cholesterol medication.  Was out of refills and unable to reach pharmacy.    Well Male Thomas Hancock is here for a complete physical.   His last physical was >1 year ago.  Current diet: in general, a "healthy" diet.  Current exercise: walking Weight trend: stable Fatigue out of ordinary? No. Seat belt? Yes.   Advanced directive? No  Health maintenance Shingrix- Yes Colonoscopy- Yes Tetanus- Yes HIV- Yes Hep C- Yes   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   Renal cell cancer, right 10/16/2019   Right renal mass    Silent micro-hemorrhage of brain    right parietal   Syncope and collapse 12/16/2020   Vitamin B12 deficiency       Past Surgical History:  Procedure Laterality Date   COLONOSCOPY WITH ESOPHAGOGASTRODUODENOSCOPY (EGD)  07/2019   NECK SURGERY     ROBOTIC ASSITED PARTIAL NEPHRECTOMY Right 10/10/2019   Procedure: XI ROBOTIC ASSITED PARTIAL NEPHRECTOMY;  Surgeon: Ceasar Mons, MD;  Location: [facility];  Service: Urology;  Laterality: Right;    Medications  Current Outpatient Medications on File Prior to Visit  Medication Sig Dispense Refill   ASPIRIN 81 PO Take 1 tablet by mouth daily.     Cyanocobalamin (B-12 PO) Take 1 tablet by mouth daily. Unknown strength     hydroxyurea (HYDREA) 500 MG capsule TAKE 1 CAPSULE BY MOUTH TWICE DAILY WITH MEALS TO  MINIMIZE  GI  SIDE  EFFECTS 60 capsule 0    Allergies No Known Allergies  Family History Family History  Problem Relation Age of Onset   Memory loss Brother    Memory  loss Paternal Uncle    Colon cancer Neg Hx    Esophageal cancer Neg Hx     Review of Systems: Constitutional:  no fevers Eye:  no recent significant change in vision Ear/Nose/Mouth/Throat:  Ears:  no hearing loss Nose/Mouth/Throat:  no complaints of nasal congestion, no sore throat Cardiovascular:  no chest pain Respiratory:  no shortness of breath Gastrointestinal:  no change in bowel habits GU:  Male: negative for dysuria, frequency Musculoskeletal/Extremities:  no joint pain Integumentary (Skin/Breast):  no abnormal skin lesions reported Neurologic:  no headaches Endocrine: No unexpected weight changes Hematologic/Lymphatic:  no abnormal bleeding  Exam BP 112/80 (BP Location: Left Arm, Patient Position: Sitting, Cuff Size: Normal)   Pulse 67   Temp 98 F (36.7 C) (Oral)   Ht 6' (1.829 m)   Wt 188 lb 2 oz (85.3 kg)   SpO2 99%   BMI 25.51 kg/m  General:  well developed, well nourished, in no apparent distress Skin:  no significant moles, warts, or growths Head:  no masses, lesions, or tenderness Eyes:  pupils equal and round, sclera anicteric without injection Ears:  canals without lesions, TMs shiny without retraction, no obvious effusion, no erythema Nose:  nares patent, mucosa normal Throat/Pharynx:  lips and gingiva without lesion; tongue and uvula midline; non-inflamed pharynx; no exudates or postnasal drainage Neck: neck supple without adenopathy, thyromegaly, or masses Cardiac: RRR, no bruits, no LE  edema Lungs:  clear to auscultation, breath sounds equal bilaterally, no respiratory distress Abdomen: BS+, soft, non-tender, non-distended, no masses or organomegaly noted Rectal: Deferred Musculoskeletal:  symmetrical muscle groups noted without atrophy or deformity Neuro:  gait normal; deep tendon reflexes normal and symmetric Psych: well oriented with normal range of affect and appropriate judgment/insight  Assessment and Plan  Well adult exam - Plan: Lipid  panel, CBC, Comprehensive metabolic panel  Screening for malignant neoplasm of prostate - Plan: PSA   Well 66 y.o. male. Counseled on diet and exercise. Counseled on risks and benefits of prostate cancer screening with PSA. The patient agrees to undergo testing. Advanced directive form provided today. Flu shot today.   Immunizations, labs, and further orders as above. Follow up in 6 mo or prn. The patient voiced understanding and agreement to the plan.  [place], DO 04/11/22 1:18 PM

## 2022-04-11 NOTE — Addendum Note (Signed)
Addended by: Sharon Seller B on: 04/11/2022 01:36 PM   Modules accepted: Orders

## 2022-04-11 NOTE — Patient Instructions (Signed)
Give us 2-3 business days to get the results of your labs back.   Keep the diet clean and stay active.  Please get me a copy of your advanced directive form at your convenience.   Let us know if you need anything.  

## 2022-04-12 ENCOUNTER — Ambulatory Visit: Payer: Commercial Managed Care - HMO | Admitting: Family Medicine

## 2022-04-14 ENCOUNTER — Encounter: Payer: Self-pay | Admitting: Hematology & Oncology

## 2022-05-01 ENCOUNTER — Other Ambulatory Visit: Payer: Self-pay | Admitting: Hematology & Oncology

## 2022-05-01 MED ORDER — HYDROXYUREA 500 MG PO CAPS: ORAL_CAPSULE | ORAL | 3 refills | Status: DC

## 2022-05-04 ENCOUNTER — Ambulatory Visit (HOSPITAL_COMMUNITY)
Admission: RE | Admit: 2022-05-04 | Discharge: 2022-05-04 | Disposition: A | Discharge status: Home / Self Care | Payer: Commercial Managed Care - HMO | Source: Ambulatory Visit | Attending: Urology | Admitting: Urology

## 2022-05-04 ENCOUNTER — Other Ambulatory Visit (HOSPITAL_COMMUNITY): Payer: Self-pay | Admitting: Urology

## 2022-05-04 DIAGNOSIS — C641 Malignant neoplasm of right kidney, except renal pelvis: Secondary | ICD-10-CM

## 2022-06-08 ENCOUNTER — Encounter: Payer: Self-pay | Admitting: Hematology & Oncology

## 2022-06-08 ENCOUNTER — Inpatient Hospital Stay: Payer: Commercial Managed Care - HMO | Attending: Hematology & Oncology

## 2022-06-08 ENCOUNTER — Other Ambulatory Visit: Payer: Self-pay

## 2022-06-08 ENCOUNTER — Inpatient Hospital Stay (HOSPITAL_BASED_OUTPATIENT_CLINIC_OR_DEPARTMENT_OTHER): Payer: Commercial Managed Care - HMO | Admitting: Hematology & Oncology

## 2022-06-08 ENCOUNTER — Inpatient Hospital Stay: Payer: Commercial Managed Care - HMO

## 2022-06-08 VITALS — BP 107/75 | HR 84 | Temp 98.3°F | Resp 18 | Ht 72.0 in | Wt 192.1 lb

## 2022-06-08 DIAGNOSIS — D45 Polycythemia vera: Secondary | ICD-10-CM | POA: Insufficient documentation

## 2022-06-08 DIAGNOSIS — Z79899 Other long term (current) drug therapy: Secondary | ICD-10-CM | POA: Insufficient documentation

## 2022-06-08 DIAGNOSIS — Z85528 Personal history of other malignant neoplasm of kidney: Secondary | ICD-10-CM | POA: Diagnosis not present

## 2022-06-08 LAB — CMP (CANCER CENTER ONLY)
ALT: 17 U/L (ref 0–44)
AST: 23 U/L (ref 15–41)
Albumin: 4.5 g/dL (ref 3.5–5.0)
Alkaline Phosphatase: 77 U/L (ref 38–126)
Anion gap: 9 (ref 5–15)
BUN: 20 mg/dL (ref 8–23)
CO2: 27 mmol/L (ref 22–32)
Calcium: 9.5 mg/dL (ref 8.9–10.3)
Chloride: 104 mmol/L (ref 98–111)
Creatinine: 1.18 mg/dL (ref 0.61–1.24)
GFR, Estimated: >60 mL/min (ref >60)
Glucose, Bld: 156 mg/dL — ABNORMAL HIGH (ref 70–99)
Potassium: 3.8 mmol/L (ref 3.5–5.1)
Sodium: 140 mmol/L (ref 135–145)
Total Bilirubin: 0.6 mg/dL (ref 0.3–1.2)
Total Protein: 7.9 g/dL (ref 6.5–8.1)

## 2022-06-08 LAB — CBC WITH DIFFERENTIAL (CANCER CENTER ONLY)
Abs Immature Granulocytes: 0.02 10*3/uL (ref 0.00–0.07)
Basophils Absolute: 0.1 10*3/uL (ref 0.0–0.1)
Basophils Relative: 2 %
Eosinophils Absolute: 0.1 10*3/uL (ref 0.0–0.5)
Eosinophils Relative: 2 %
HCT: 44.4 % (ref 39.0–52.0)
Hemoglobin: 14.3 g/dL (ref 13.0–17.0)
Immature Granulocytes: 0 %
Lymphocytes Relative: 16 %
Lymphs Abs: 1 10*3/uL (ref 0.7–4.0)
MCH: 29.1 pg (ref 26.0–34.0)
MCHC: 32.2 g/dL (ref 30.0–36.0)
MCV: 90.4 fL (ref 80.0–100.0)
Monocytes Absolute: 0.4 10*3/uL (ref 0.1–1.0)
Monocytes Relative: 7 %
Neutro Abs: 4.3 10*3/uL (ref 1.7–7.7)
Neutrophils Relative %: 73 %
Platelet Count: 415 10*3/uL — ABNORMAL HIGH (ref 150–400)
RBC: 4.91 MIL/uL (ref 4.22–5.81)
RDW: 13.1 % (ref 11.5–15.5)
WBC Count: 5.9 10*3/uL (ref 4.0–10.5)
nRBC: 0 % (ref 0.0–0.2)

## 2022-06-08 LAB — LACTATE DEHYDROGENASE: LDH: 129 U/L (ref 98–192)

## 2022-06-08 LAB — FERRITIN: Ferritin: 8 ng/mL — ABNORMAL LOW (ref 24–336)

## 2022-06-08 LAB — IRON AND IRON BINDING CAPACITY (CC-WL,HP ONLY)
Iron: 53 ug/dL (ref 45–182)
Saturation Ratios: 11 % — ABNORMAL LOW (ref 17.9–39.5)
TIBC: 470 ug/dL — ABNORMAL HIGH (ref 250–450)
UIBC: 417 ug/dL — ABNORMAL HIGH (ref 117–376)

## 2022-06-08 LAB — SAVE SMEAR(SSMR), FOR PROVIDER SLIDE REVIEW

## 2022-06-08 NOTE — Progress Notes (Signed)
Hematology and Oncology Follow Up Visit  Thomas Hancock [id number] January 08, 1956 66 y.o. 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  He is doing okay.  His main complaint has been his memory.  He has had memory issues in the past.  I told him that this is something that his family doctor will have to manage.  I cannot imagine that this is anything related to his hematologic issue.  His polycythemia has been very well controlled.  His hemochromatosis has also been very well controlled.  When we last saw him in September, his ferritin was 9 with an iron saturation of 14%.  He is on 500 mg of Hydrea a day.  This is controlling his platelet count fairly well.  He saw urology recently.  He had a CT scan as follow-up for his renal cell carcinoma.  From what he says, the CT scan turned out okay.  He has had no change in bowel or bladder habits.  He has had no cough.  His grandson is 22 years old.  I am sure that they we will see him for Christmas.  His appetite has been okay.  He has had no nausea or vomiting.  Overall, I would say his performance status is probably ECOG 1.      Medications:  Allergies as of 06/08/2022   No Known Allergies      Medication List        Accurate as of June 08, 2022  1:40 PM. If you have any questions, ask your nurse or doctor.          ASPIRIN 81 PO Take 1 tablet by mouth daily.   atorvastatin 10 MG tablet Commonly known as: LIPITOR TAKE 1 TABLET BY MOUTH ONCE DAILY.   B-12 PO Take 1 tablet by mouth daily. Unknown  strength   hydroxyurea 500 MG capsule Commonly known as: HYDREA TAKE 1 CAPSULE BY MOUTH TWICE DAILY WITH MEALS TO  MINIMIZE  GI  SIDE  EFFECTS   sertraline 50 MG tablet Commonly known as: ZOLOFT Take 1 tablet (50 mg total) by mouth daily.        Allergies: No Known Allergies  Past Medical History, Surgical history, Social history, and Family History were reviewed and updated.  Review of Systems: Review of Systems  Constitutional: Negative.   HENT: Negative.    Eyes: Negative.   Respiratory: Negative.    Cardiovascular: Negative.   Gastrointestinal:  Positive for abdominal pain.  Genitourinary: Negative.   Musculoskeletal: Negative.   Skin: Negative.   Neurological: Negative.   Endo/Heme/Allergies: Negative.   Psychiatric/Behavioral: Negative.       Physical Exam:  height is 6' (1.829 m) and weight is 192 lb 1.3 oz (87.1 kg). His oral temperature is 98.3 F (36.8 C). His blood pressure is 107/75 and his pulse is 84. His respiration is 18 and oxygen saturation is 100%.   Wt Readings from Last 3 Encounters:  06/08/22 192 lb 1.3 oz (87.1 kg)  04/11/22 188 lb 2 oz (85.3 kg)  03/09/22 187  lb 1.9 oz (84.9 kg)    Physical Exam Vitals reviewed.  HENT:     Head: Normocephalic and atraumatic.  Eyes:     Pupils: Pupils are equal, round, and reactive to light.  Cardiovascular:     Rate and Rhythm: Normal rate and regular rhythm.     Heart sounds: Normal heart sounds.  Pulmonary:     Effort: Pulmonary effort is normal.     Breath sounds: Normal breath sounds.  Abdominal:     General: Bowel sounds are normal.     Palpations: Abdomen is soft.     Comments: Abdominal exam shows healing laparoscopy scars.  I think he has 5 scars.  They are well-healed.  There is no erythema.  There is no exudate.  His abdomen is slightly distended.  He has slightly decreased bowel sounds.  There is no guarding.  He has no palpable abdominal mass.  Is no palpable liver or spleen tip.   Musculoskeletal:        General: No tenderness or deformity. Normal range of motion.     Cervical back: Normal range of motion.  Lymphadenopathy:     Cervical: No cervical adenopathy.  Skin:    General: Skin is warm and dry.     Findings: No erythema or rash.  Neurological:     Mental Status: He is alert and oriented to person, place, and time.  Psychiatric:        Behavior: Behavior normal.        Thought Content: Thought content normal.        Judgment: Judgment normal.      Lab Results  Component Value Date   WBC 5.9 06/08/2022   HGB 14.3 06/08/2022   HCT 44.4 06/08/2022   MCV 90.4 06/08/2022   PLT 415 (H) 06/08/2022   Lab Results  Component Value Date   FERRITIN 9 (L) 03/09/2022   IRON 64 03/09/2022   TIBC 455 (H) 03/09/2022   UIBC 391 (H) 03/09/2022   IRONPCTSAT 14 (L) 03/09/2022   Lab Results  Component Value Date   RETICCTPCT 1.2 07/08/2020   RBC 4.91 06/08/2022   No results found for: "KPAFRELGTCHN", "LAMBDASER", "KAPLAMBRATIO" No results found for: "IGGSERUM", "IGA", "IGMSERUM" No results found for: "TOTALPROTELP", "ALBUMINELP", "A1GS", "A2GS", "BETS", "BETA2SER", "GAMS", "MSPIKE", "SPEI"   Chemistry      Component Value Date/Time   NA 140 04/11/2022 1333   K 4.8 04/11/2022 1333   CL 104 04/11/2022 1333   CO2 28 04/11/2022 1333   BUN 16 04/11/2022 1333   CREATININE 0.96 04/11/2022 1333   CREATININE 1.16 03/09/2022 1158      Component Value Date/Time   CALCIUM 9.3 04/11/2022 1333   ALKPHOS 85 04/11/2022 1333   AST 22 04/11/2022 1333   AST 22 03/09/2022 1158   ALT 16 04/11/2022 1333   ALT 15 03/09/2022 1158   BILITOT 0.6 04/11/2022 1333   BILITOT 0.8 03/09/2022 1158       Impression and Plan: Thomas Hancock is a very pleasant 66 yo [name] with Polycythemia vera -  JAK 2 positive; 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  We do not have to phlebotomize him.  I am sure that that the We see  him and we probably will have to phlebotomize him.  His platelet count could seem to be on the higher side because of the iron deficiency.  We will have to talk to his family doctor about doing  some kind of neurologic testing for the memory loss.  I am sure that there are neuropsychologist that he can be referred to for this specialized testing.  For right now, we will plan to get him back in another 3 months.  Of note, his wife will be going to [place] in January.  She will be gone for 2 months.   Volanda Napoleon, MD 12/14/20231:40 PM

## 2022-06-16 ENCOUNTER — Ambulatory Visit: Payer: Commercial Managed Care - HMO | Admitting: Family Medicine

## 2022-08-18 ENCOUNTER — Encounter: Payer: Self-pay | Admitting: Gastroenterology

## 2022-09-14 ENCOUNTER — Encounter: Payer: Self-pay | Admitting: Family

## 2022-09-21 ENCOUNTER — Inpatient Hospital Stay: Payer: Medicaid Other

## 2022-09-21 ENCOUNTER — Encounter: Payer: Self-pay | Admitting: Family

## 2022-09-21 ENCOUNTER — Inpatient Hospital Stay (HOSPITAL_BASED_OUTPATIENT_CLINIC_OR_DEPARTMENT_OTHER): Payer: Medicaid Other | Admitting: Hematology & Oncology

## 2022-09-21 ENCOUNTER — Other Ambulatory Visit: Payer: Self-pay

## 2022-09-21 ENCOUNTER — Encounter: Payer: Self-pay | Admitting: Hematology & Oncology

## 2022-09-21 ENCOUNTER — Inpatient Hospital Stay: Payer: Medicaid Other | Attending: Hematology & Oncology

## 2022-09-21 VITALS — BP 125/83 | HR 75 | Temp 98.1°F | Resp 18 | Ht 72.0 in | Wt 195.0 lb

## 2022-09-21 DIAGNOSIS — G3184 Mild cognitive impairment, so stated: Secondary | ICD-10-CM

## 2022-09-21 DIAGNOSIS — D45 Polycythemia vera: Secondary | ICD-10-CM

## 2022-09-21 DIAGNOSIS — Z85528 Personal history of other malignant neoplasm of kidney: Secondary | ICD-10-CM | POA: Insufficient documentation

## 2022-09-21 LAB — CBC WITH DIFFERENTIAL (CANCER CENTER ONLY)
Abs Immature Granulocytes: 0.02 10*3/uL (ref 0.00–0.07)
Basophils Absolute: 0.1 10*3/uL (ref 0.0–0.1)
Basophils Relative: 2 %
Eosinophils Absolute: 0.2 10*3/uL (ref 0.0–0.5)
Eosinophils Relative: 3 %
HCT: 44.9 % (ref 39.0–52.0)
Hemoglobin: 14.4 g/dL (ref 13.0–17.0)
Immature Granulocytes: 0 %
Lymphocytes Relative: 23 %
Lymphs Abs: 1.4 10*3/uL (ref 0.7–4.0)
MCH: 28.6 pg (ref 26.0–34.0)
MCHC: 32.1 g/dL (ref 30.0–36.0)
MCV: 89.3 fL (ref 80.0–100.0)
Monocytes Absolute: 0.6 10*3/uL (ref 0.1–1.0)
Monocytes Relative: 10 %
Neutro Abs: 3.7 10*3/uL (ref 1.7–7.7)
Neutrophils Relative %: 62 %
Platelet Count: 352 10*3/uL (ref 150–400)
RBC: 5.03 MIL/uL (ref 4.22–5.81)
RDW: 14.1 % (ref 11.5–15.5)
WBC Count: 6 10*3/uL (ref 4.0–10.5)
nRBC: 0 % (ref 0.0–0.2)

## 2022-09-21 LAB — CMP (CANCER CENTER ONLY)
ALT: 23 U/L (ref 0–44)
AST: 25 U/L (ref 15–41)
Albumin: 4.6 g/dL (ref 3.5–5.0)
Alkaline Phosphatase: 91 U/L (ref 38–126)
Anion gap: 8 (ref 5–15)
BUN: 21 mg/dL (ref 8–23)
CO2: 29 mmol/L (ref 22–32)
Calcium: 9.5 mg/dL (ref 8.9–10.3)
Chloride: 102 mmol/L (ref 98–111)
Creatinine: 1.21 mg/dL (ref 0.61–1.24)
GFR, Estimated: >60 mL/min (ref >60)
Glucose, Bld: 130 mg/dL — ABNORMAL HIGH (ref 70–99)
Potassium: 4.1 mmol/L (ref 3.5–5.1)
Sodium: 139 mmol/L (ref 135–145)
Total Bilirubin: 0.4 mg/dL (ref 0.3–1.2)
Total Protein: 7.6 g/dL (ref 6.5–8.1)

## 2022-09-21 LAB — FERRITIN: Ferritin: 8 ng/mL — ABNORMAL LOW (ref 24–336)

## 2022-09-21 LAB — LACTATE DEHYDROGENASE: LDH: 143 U/L (ref 98–192)

## 2022-09-21 NOTE — Progress Notes (Signed)
Thomas Hancock presents today for phlebotomy per MD orders. Phlebotomy procedure started at 1425 and ended at 1434. 563 grams removed via 16 gauge needle to left AC.  Patient observed for 30 minutes after procedure without any incident. Patient tolerated procedure well and received replacement fluids after procedure.  Patient understands to call if he has any questions or concerns post discharge.

## 2022-09-21 NOTE — Patient Instructions (Signed)

## 2022-09-21 NOTE — Progress Notes (Signed)
Hematology and Oncology Follow Up Visit  Thomas Hancock [id number] September 04, 1955 67 y.o. 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  His wife just got back from [place].  It sounds like she had a wonderful time out there.  He still is having issues with his memory.  I really think that he needs formal neuropsychologic testing.  We will have to see about making a referral for this.  He has had no headaches.  He is retired now.  He is enjoying retirement.  There is been no problems with respect to hemochromatosis.  We are keeping his iron levels under very good control.  When we last saw him, his ferritin was 8 with an iron saturation of 11%.  He is doing well on the Hydrea.  He is on 500 mg of Hydrea a day.  He also takes baby aspirin.  We will go ahead and phlebotomize him today as his hematocrit is basically 45%.  He has had no change in bowel or bladder habits.  He has had no rashes.  He has had no issues with respect to the renal cell carcinoma.  He is being followed by Urology for this.  Overall, I would say his performance status is probably ECOG 1.    Medications:  Allergies as of 09/21/2022   No Known Allergies      Medication List        Accurate as of September 21, 2022  2:18 PM. If you have any questions, ask your nurse or doctor.          ASPIRIN 81 PO Take 1 tablet by mouth daily.   atorvastatin 10 MG tablet Commonly known as: LIPITOR TAKE 1 TABLET BY MOUTH ONCE DAILY.   B-12 PO Take 1 tablet by mouth daily. Unknown strength    hydroxyurea 500 MG capsule Commonly known as: HYDREA TAKE 1 CAPSULE BY MOUTH TWICE DAILY WITH MEALS TO  MINIMIZE  GI  SIDE  EFFECTS   sertraline 50 MG tablet Commonly known as: ZOLOFT Take 1 tablet (50 mg total) by mouth daily.        Allergies: No Known Allergies  Past Medical History, Surgical history, Social history, and Family History were reviewed and updated.  Review of Systems: Review of Systems  Constitutional: Negative.   HENT: Negative.    Eyes: Negative.   Respiratory: Negative.    Cardiovascular: Negative.   Gastrointestinal:  Positive for abdominal pain.  Genitourinary: Negative.   Musculoskeletal: Negative.   Skin: Negative.   Neurological: Negative.   Endo/Heme/Allergies: Negative.   Psychiatric/Behavioral: Negative.       Physical Exam:  height is 6' (1.829 m) and weight is 195 lb (88.5 kg). His oral temperature is 98.1 F (36.7 C). His blood pressure is 125/83 and his pulse is 75. His respiration is 18 and oxygen saturation is 100%.   Wt Readings from Last 3 Encounters:  09/21/22 195 lb (88.5 kg)  06/08/22 192 lb 1.3 oz (87.1 kg)  04/11/22 188 lb 2 oz (85.3 kg)    Physical  Exam Vitals reviewed.  HENT:     Head: Normocephalic and atraumatic.  Eyes:     Pupils: Pupils are equal, round, and reactive to light.  Cardiovascular:     Rate and Rhythm: Normal rate and regular rhythm.     Heart sounds: Normal heart sounds.  Pulmonary:     Effort: Pulmonary effort is normal.     Breath sounds: Normal breath sounds.  Abdominal:     General: Bowel sounds are normal.     Palpations: Abdomen is soft.     Comments: Abdominal exam shows healing laparoscopy scars.  I think he has 5 scars.  They are well-healed.  There is no erythema.  There is no exudate.  His abdomen is slightly distended.  He has slightly decreased bowel sounds.  There is no guarding.  He has no palpable abdominal mass.  Is no palpable liver or spleen tip.  Musculoskeletal:         General: No tenderness or deformity. Normal range of motion.     Cervical back: Normal range of motion.  Lymphadenopathy:     Cervical: No cervical adenopathy.  Skin:    General: Skin is warm and dry.     Findings: No erythema or rash.  Neurological:     Mental Status: He is alert and oriented to person, place, and time.  Psychiatric:        Behavior: Behavior normal.        Thought Content: Thought content normal.        Judgment: Judgment normal.      Lab Results  Component Value Date   WBC 6.0 09/21/2022   HGB 14.4 09/21/2022   HCT 44.9 09/21/2022   MCV 89.3 09/21/2022   PLT 352 09/21/2022   Lab Results  Component Value Date   FERRITIN 8 (L) 06/08/2022   IRON 53 06/08/2022   TIBC 470 (H) 06/08/2022   UIBC 417 (H) 06/08/2022   IRONPCTSAT 11 (L) 06/08/2022   Lab Results  Component Value Date   RETICCTPCT 1.2 07/08/2020   RBC 5.03 09/21/2022   No results found for: "KPAFRELGTCHN", "LAMBDASER", "KAPLAMBRATIO" No results found for: "IGGSERUM", "IGA", "IGMSERUM" No results found for: "TOTALPROTELP", "ALBUMINELP", "A1GS", "A2GS", "BETS", "BETA2SER", "GAMS", "MSPIKE", "SPEI"   Chemistry      Component Value Date/Time   NA 139 09/21/2022 1332   K 4.1 09/21/2022 1332   CL 102 09/21/2022 1332   CO2 29 09/21/2022 1332   BUN 21 09/21/2022 1332   CREATININE 1.21 09/21/2022 1332      Component Value Date/Time   CALCIUM 9.5 09/21/2022 1332   ALKPHOS 91 09/21/2022 1332   AST 25 09/21/2022 1332   ALT 23 09/21/2022 1332   BILITOT 0.4 09/21/2022 1332       Impression and Plan: Thomas Hancock is a very pleasant 67 yo [name] with Polycythemia vera -  JAK 2 positive; 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  We we will phlebotomize him today.  I do think this will help him out.  His platelet count is doing great.  I am not too worried about this.  The Hydrea is also helping him.  Again I just hate that is having this  issue with the memory.  We will have to see about some formal testing.  We will see about making a referral for neuropsychology.  We will still plan to get him back in 3 months.  I think this is a  good interval for follow-up.    Volanda Napoleon, MD 3/28/20242:18 PM

## 2022-09-22 LAB — IRON AND IRON BINDING CAPACITY (CC-WL,HP ONLY)
Iron: 35 ug/dL — ABNORMAL LOW (ref 45–182)
Saturation Ratios: 8 % — ABNORMAL LOW (ref 17.9–39.5)
TIBC: 462 ug/dL — ABNORMAL HIGH (ref 250–450)
UIBC: 427 ug/dL — ABNORMAL HIGH (ref 117–376)

## 2022-09-27 ENCOUNTER — Encounter: Payer: Self-pay | Admitting: Family

## 2022-10-04 ENCOUNTER — Other Ambulatory Visit: Payer: Self-pay | Admitting: *Deleted

## 2022-10-04 ENCOUNTER — Encounter: Payer: Self-pay | Admitting: Hematology & Oncology

## 2022-10-04 ENCOUNTER — Encounter: Payer: Self-pay | Admitting: Family Medicine

## 2022-10-04 MED ORDER — ATORVASTATIN CALCIUM 10 MG PO TABS: ORAL_TABLET | ORAL | 2 refills | Status: DC

## 2022-10-04 MED ORDER — HYDROXYUREA 500 MG PO CAPS: 500.0000 mg | ORAL_CAPSULE | Freq: Two times a day (BID) | ORAL | 0 refills | Status: AC

## 2022-10-04 MED ORDER — HYDROXYUREA 500 MG PO CAPS: ORAL_CAPSULE | ORAL | 2 refills | Status: DC

## 2022-10-04 MED ORDER — SERTRALINE HCL 50 MG PO TABS: 50.0000 mg | ORAL_TABLET | Freq: Every day | ORAL | 2 refills | Status: DC

## 2022-10-04 NOTE — Telephone Encounter (Signed)
Pt is requesting a 90 day supply of Hydroxyurea to [facility]. Last refilled 05/01/22 #60 with 3 refills. Please advise, thanks.

## 2022-10-24 ENCOUNTER — Encounter: Payer: Self-pay | Admitting: Family

## 2022-10-25 ENCOUNTER — Encounter: Payer: Self-pay | Admitting: Family

## 2022-10-26 ENCOUNTER — Encounter: Payer: Self-pay | Admitting: Family

## 2022-10-31 ENCOUNTER — Ambulatory Visit (AMBULATORY_SURGERY_CENTER): Payer: Medicare Other

## 2022-10-31 ENCOUNTER — Encounter: Payer: Self-pay | Admitting: Family

## 2022-10-31 VITALS — Ht 72.0 in | Wt 196.8 lb

## 2022-10-31 DIAGNOSIS — Z86010 Personal history of colonic polyps: Secondary | ICD-10-CM

## 2022-10-31 MED ORDER — PEG 3350-KCL-NA BICARB-NACL 420 G PO SOLR: 4000.0000 mL | Freq: Once | ORAL | 0 refills | Status: AC

## 2022-10-31 NOTE — Progress Notes (Signed)
No egg or soy allergy known to patient  No issues known to pt with past sedation with any surgeries or procedures - pt reports muscle pain after general anesthesia for his partial nephrectomy  Patient denies ever being told they had issues or difficulty with intubation  No FH of Malignant Hyperthermia Pt is not on diet pills Pt is not on  home 02  Pt is not on blood thinners  Pt denies issues with constipation  No A fib or A flutter Have any cardiac testing pending--no  Pt denies any mobility issues   PV completed with wife Uzma as Nurse, learning disability . Pt waived interpreter services. Waiver form filled out at apt. Prep reviewed with husband and wife. Hard copy given. Rx sent to [facility] on [address].

## 2022-11-10 ENCOUNTER — Encounter: Payer: Self-pay | Admitting: Hematology & Oncology

## 2022-12-01 ENCOUNTER — Encounter: Payer: Self-pay | Admitting: Gastroenterology

## 2022-12-01 ENCOUNTER — Ambulatory Visit (AMBULATORY_SURGERY_CENTER): Payer: Medicare Other | Admitting: Gastroenterology

## 2022-12-01 VITALS — BP 102/65 | HR 69 | Temp 98.4°F | Resp 14 | Ht 72.0 in | Wt 196.8 lb

## 2022-12-01 DIAGNOSIS — Z86010 Personal history of colonic polyps: Secondary | ICD-10-CM | POA: Diagnosis not present

## 2022-12-01 DIAGNOSIS — D120 Benign neoplasm of cecum: Secondary | ICD-10-CM

## 2022-12-01 DIAGNOSIS — Z09 Encounter for follow-up examination after completed treatment for conditions other than malignant neoplasm: Secondary | ICD-10-CM

## 2022-12-01 DIAGNOSIS — D122 Benign neoplasm of ascending colon: Secondary | ICD-10-CM

## 2022-12-01 MED ORDER — SODIUM CHLORIDE 0.9 % IV SOLN
500.0000 mL | Freq: Once | INTRAVENOUS | Status: DC
Start: 2022-12-01 — End: 2022-12-01

## 2022-12-01 NOTE — Patient Instructions (Signed)
Handouts provided about hemorrhoids, diverticulosis and polyps.  Continue presents medications.  Await pathology results.  Repeat colonoscopy for surveillance based on pathology results.     YOU HAD AN ENDOSCOPIC PROCEDURE TODAY AT THE [facility]:   Refer to the procedure report that was given to you for any specific questions about what was found during the examination.  If the procedure report does not answer your questions, please call your gastroenterologist to clarify.  If you requested that your care partner not be given the details of your procedure findings, then the procedure report has been included in a sealed envelope for you to review at your convenience later.  YOU SHOULD EXPECT: Some feelings of bloating in the abdomen. Passage of more gas than usual.  Walking can help get rid of the air that was put into your GI tract during the procedure and reduce the bloating. If you had a lower endoscopy (such as a colonoscopy or flexible sigmoidoscopy) you may notice spotting of blood in your stool or on the toilet paper. If you underwent a bowel prep for your procedure, you may not have a normal bowel movement for a few days.  Please Note:  You might notice some irritation and congestion in your nose or some drainage.  This is from the oxygen used during your procedure.  There is no need for concern and it should clear up in a day or so.  SYMPTOMS TO REPORT IMMEDIATELY:  Following lower endoscopy (colonoscopy or flexible sigmoidoscopy):  Excessive amounts of blood in the stool  Significant tenderness or worsening of abdominal pains  Swelling of the abdomen that is new, acute  Fever of 100F or higher  For urgent or emergent issues, a gastroenterologist can be reached at any hour by calling [phone]. Do not use MyChart messaging for urgent concerns.    DIET:  We do recommend a small meal at first, but then you may proceed to your regular diet.  Drink plenty of fluids but  you should avoid alcoholic beverages for 24 hours.  ACTIVITY:  You should plan to take it easy for the rest of today and you should NOT DRIVE or use heavy machinery until tomorrow (because of the sedation medicines used during the test).    FOLLOW UP: Our staff will call the number listed on your records the next business day following your procedure.  We will call around 7:15- 8:00 am to check on you and address any questions or concerns that you may have regarding the information given to you following your procedure. If we do not reach you, we will leave a message.     If any biopsies were taken you will be contacted by phone or by letter within the next 1-3 weeks.  Please call us at [phone] if you have not heard about the biopsies in 3 weeks.    SIGNATURES/CONFIDENTIALITY: You and/or your care partner have signed paperwork which will be entered into your electronic medical record.  These signatures attest to the fact that that the information above on your After Visit Summary has been reviewed and is understood.  Full responsibility of the confidentiality of this discharge information lies with you and/or your care-partner.

## 2022-12-01 NOTE — Progress Notes (Signed)
VS completed by DT.  Pt's states no medical or surgical changes since previsit or office visit.  

## 2022-12-01 NOTE — Op Note (Signed)
[facility] Patient Name: Thomas Hancock Procedure Date: 12/01/2022 10:18 AM MRN: [id number] Endoscopist: Lynann Bologna , MD, [id number] Age: 67 Referring MD:  Date of Birth: 02/13/1956 Gender: Male Account #: [id number] Procedure:                Colonoscopy Indications:              High risk colon cancer surveillance: Personal                            history of colonic polyps Medicines:                Monitored Anesthesia Care Procedure:                Pre-Anesthesia Assessment:                           - Prior to the procedure, a History and Physical                            was performed, and patient medications and                            allergies were reviewed. The patient's tolerance of                            previous anesthesia was also reviewed. The risks                            and benefits of the procedure and the sedation                            options and risks were discussed with the patient.                            All questions were answered, and informed consent                            was obtained. Prior Anticoagulants: The patient has                            taken no anticoagulant or antiplatelet agents. ASA                            Grade Assessment: II - A patient with mild systemic                            disease. After reviewing the risks and benefits,                            the patient was deemed in satisfactory condition to                            undergo the procedure.  After obtaining informed consent, the colonoscope                            was passed under direct vision. Throughout the                            procedure, the patient's blood pressure, pulse, and                            oxygen saturations were monitored continuously. The                            Olympus [id number]-[id number]) Colonoscope was                            introduced through the anus and advanced  to the the                            cecum, identified by appendiceal orifice and                            ileocecal valve. The colonoscopy was performed                            without difficulty. The patient tolerated the                            procedure well. The quality of the bowel                            preparation was good. The ileocecal valve,                            appendiceal orifice, and rectum were photographed. Scope In: 10:33:01 AM Scope Out: 10:44:46 AM Scope Withdrawal Time: 0 hours 9 minutes 31 seconds  Total Procedure Duration: 0 hours 11 minutes 45 seconds  Findings:                 Two sessile polyps were found in the proximal                            ascending colon and cecum. The polyps were 4 to 6                            mm in size. These polyps were removed with a cold                            snare. Resection and retrieval were complete.                           A few small-mouthed diverticula were found in the                            sigmoid colon.  Non-bleeding internal hemorrhoids were found during                            retroflexion. The hemorrhoids were small and Grade                            I (internal hemorrhoids that do not prolapse).                           The exam was otherwise without abnormality on                            direct and retroflexion views. Complications:            No immediate complications. Estimated Blood Loss:     Estimated blood loss: none. Impression:               - Two 4 to 6 mm polyps in the proximal ascending                            colon and in the cecum, removed with a cold snare.                            Resected and retrieved.                           - Mild sigmoid diverticulosis.                           - Non-bleeding internal hemorrhoids.                           - The examination was otherwise normal on direct                            and  retroflexion views. Recommendation:           - Patient has a contact number available for                            emergencies. The signs and symptoms of potential                            delayed complications were discussed with the                            patient. Return to normal activities tomorrow.                            Written discharge instructions were provided to the                            patient.                           - Resume previous diet.                           -  Continue present medications.                           - Await pathology results.                           - Repeat colonoscopy for surveillance based on                            pathology results.                           - The findings and recommendations were discussed                            with the patient's family. Lynann Bologna, MD 12/01/2022 10:48:35 AM This report has been signed electronically.

## 2022-12-01 NOTE — Progress Notes (Signed)
Vss nad trans to pacu 

## 2022-12-01 NOTE — Progress Notes (Signed)
[facility] History and Physical   Primary Care Physician:  Sharlene Dory, DO   Reason for Procedure:   History of polyps  Plan:     colonoscopy     HPI: Thomas Hancock is a 67 y.o. 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 Authorizing Provider  ASPIRIN 81 PO Take 1 tablet by mouth daily.   Yes [provider]  Cyanocobalamin (B-12 PO) Take 1 tablet by mouth daily. Unknown strength   Yes [provider]  hydroxyurea (HYDREA) 500 MG capsule TAKE 1 CAPSULE BY MOUTH TWICE DAILY WITH MEALS TO  MINIMIZE  GI  SIDE  EFFECTS 10/04/22  Yes Ennever, Rose Phi, MD  hydroxyurea (HYDREA) 500 MG capsule Take 1 capsule (500 mg total) by mouth 2 (two) times daily. May take with food to minimize GI side effects. 10/04/22 01/02/23 Yes Ennever, Rose Phi, MD  sertraline (ZOLOFT) 50 MG tablet Take 1 tablet (50 mg total) by mouth daily. 10/04/22  Yes Sharlene Dory, DO  atorvastatin (LIPITOR) 10 MG tablet TAKE 1 TABLET  BY MOUTH ONCE DAILY. Patient not taking: Reported on 12/01/2022 10/04/22   Sharlene Dory, DO    Current Outpatient Medications  Medication Sig Dispense Refill   ASPIRIN 81 PO Take 1 tablet by mouth daily.     Cyanocobalamin (B-12 PO) Take 1 tablet by mouth daily. Unknown strength     hydroxyurea (HYDREA) 500 MG capsule TAKE 1 CAPSULE BY MOUTH TWICE DAILY WITH MEALS TO  MINIMIZE  GI  SIDE  EFFECTS 180 capsule 2   hydroxyurea (HYDREA) 500 MG capsule Take 1 capsule (500 mg total) by mouth 2 (two) times daily. May take with food to minimize GI side effects. 180 capsule 0   sertraline (ZOLOFT) 50 MG tablet Take 1 tablet (50 mg total) by mouth daily. 90 tablet 2   atorvastatin (LIPITOR) 10 MG tablet TAKE 1 TABLET BY MOUTH ONCE DAILY. (Patient not taking: Reported on 12/01/2022) 90 tablet 2   Current Facility-Administered Medications  Medication Dose Route Frequency Provider Last Rate Last Admin   0.9 %  sodium chloride infusion  500 mL Intravenous Once Lynann Bologna, MD        Allergies as of 12/01/2022   (No Known Allergies)    Family History  Problem Relation Age of Onset   Memory loss Brother    Memory loss Paternal Uncle    Colon cancer Neg Hx    Esophageal cancer Neg Hx    Rectal  cancer Neg Hx    Stomach cancer Neg Hx     Social History   Socioeconomic History   Marital status: Married    Spouse name: Not on file   Number of children: 2   Years of education: 16   Highest education level: Bachelor's degree (e.g., BA, AB, BS)  Occupational History   Occupation: Sales  Tobacco Use   Smoking status: Former    Types: Cigarettes   Smokeless tobacco: Never   Tobacco comments:    quit over 20 years ago  Vaping Use   Vaping Use: Never used  Substance and Sexual Activity   Alcohol use: Never   Drug use: Never   Sexual activity: Not on file  Other Topics Concern   Not on file  Social History Narrative   Right handed   Drinks caffeine   Two story home   Social  Determinants of Health   Financial Resource Strain: Not on file  Food Insecurity: Not on file  Transportation Needs: Not on file  Physical Activity: Not on file  Stress: Not on file  Social Connections: Not on file  Intimate Partner Violence: Not on file    Review of Systems: Positive for nine All other review of systems negative except as mentioned in the HPI.  Physical Exam: Vital signs in last 24 hours: @VSRANGES @   General:   Alert,  Well-developed, well-nourished, pleasant and cooperative in NAD Lungs:  Clear throughout to auscultation.   Heart:  Regular rate and rhythm; no murmurs, clicks, rubs,  or gallops. Abdomen:  Soft, nontender and nondistended. Normal bowel sounds.   Neuro/Psych:  Alert and cooperative. Normal mood and affect. A and O x 3    No significant changes were identified.  The patient continues to be an appropriate candidate for the planned procedure and anesthesia.   Edman Circle, MD. [facility] 12/01/2022 10:27 AM@

## 2022-12-01 NOTE — Progress Notes (Signed)
Called to room to assist during endoscopic procedure.  Patient ID and intended procedure confirmed with present staff. Received instructions for my participation in the procedure from the performing physician.  

## 2022-12-04 ENCOUNTER — Telehealth: Payer: Self-pay

## 2022-12-04 NOTE — Telephone Encounter (Signed)
Post procedure follow up call, no answer 

## 2022-12-14 ENCOUNTER — Encounter: Payer: Self-pay | Admitting: Gastroenterology

## 2022-12-22 ENCOUNTER — Ambulatory Visit: Payer: Medicaid Other | Admitting: Hematology & Oncology

## 2022-12-22 ENCOUNTER — Other Ambulatory Visit: Payer: Medicaid Other

## 2022-12-25 ENCOUNTER — Encounter: Payer: Self-pay | Admitting: Hematology & Oncology

## 2022-12-25 ENCOUNTER — Inpatient Hospital Stay: Payer: Medicare Other | Attending: Hematology & Oncology

## 2022-12-25 ENCOUNTER — Inpatient Hospital Stay: Payer: Medicare Other

## 2022-12-25 ENCOUNTER — Inpatient Hospital Stay (HOSPITAL_BASED_OUTPATIENT_CLINIC_OR_DEPARTMENT_OTHER): Payer: Medicare Other | Admitting: Hematology & Oncology

## 2022-12-25 ENCOUNTER — Other Ambulatory Visit: Payer: Self-pay

## 2022-12-25 VITALS — BP 115/76 | HR 96 | Temp 98.7°F | Resp 18 | Ht 72.0 in | Wt 197.0 lb

## 2022-12-25 DIAGNOSIS — D45 Polycythemia vera: Secondary | ICD-10-CM

## 2022-12-25 DIAGNOSIS — Z85528 Personal history of other malignant neoplasm of kidney: Secondary | ICD-10-CM | POA: Diagnosis not present

## 2022-12-25 DIAGNOSIS — C641 Malignant neoplasm of right kidney, except renal pelvis: Secondary | ICD-10-CM | POA: Diagnosis not present

## 2022-12-25 DIAGNOSIS — G3184 Mild cognitive impairment, so stated: Secondary | ICD-10-CM

## 2022-12-25 LAB — CMP (CANCER CENTER ONLY)
ALT: 14 U/L (ref 0–44)
AST: 20 U/L (ref 15–41)
Albumin: 4.6 g/dL (ref 3.5–5.0)
Alkaline Phosphatase: 73 U/L (ref 38–126)
Anion gap: 10 (ref 5–15)
BUN: 24 mg/dL — ABNORMAL HIGH (ref 8–23)
CO2: 24 mmol/L (ref 22–32)
Calcium: 9.5 mg/dL (ref 8.9–10.3)
Chloride: 105 mmol/L (ref 98–111)
Creatinine: 1.21 mg/dL (ref 0.61–1.24)
GFR, Estimated: >60 mL/min (ref >60)
Glucose, Bld: 134 mg/dL — ABNORMAL HIGH (ref 70–99)
Potassium: 3.7 mmol/L (ref 3.5–5.1)
Sodium: 139 mmol/L (ref 135–145)
Total Bilirubin: 0.6 mg/dL (ref 0.3–1.2)
Total Protein: 8.1 g/dL (ref 6.5–8.1)

## 2022-12-25 LAB — CBC WITH DIFFERENTIAL (CANCER CENTER ONLY)
Abs Immature Granulocytes: 0.02 10*3/uL (ref 0.00–0.07)
Basophils Absolute: 0.1 10*3/uL (ref 0.0–0.1)
Basophils Relative: 2 %
Eosinophils Absolute: 0.1 10*3/uL (ref 0.0–0.5)
Eosinophils Relative: 2 %
HCT: 42 % (ref 39.0–52.0)
Hemoglobin: 13.3 g/dL (ref 13.0–17.0)
Immature Granulocytes: 0 %
Lymphocytes Relative: 22 %
Lymphs Abs: 1.4 10*3/uL (ref 0.7–4.0)
MCH: 27.8 pg (ref 26.0–34.0)
MCHC: 31.7 g/dL (ref 30.0–36.0)
MCV: 87.9 fL (ref 80.0–100.0)
Monocytes Absolute: 0.6 10*3/uL (ref 0.1–1.0)
Monocytes Relative: 9 %
Neutro Abs: 4 10*3/uL (ref 1.7–7.7)
Neutrophils Relative %: 65 %
Platelet Count: 400 10*3/uL (ref 150–400)
RBC: 4.78 MIL/uL (ref 4.22–5.81)
RDW: 13.9 % (ref 11.5–15.5)
WBC Count: 6.2 10*3/uL (ref 4.0–10.5)
nRBC: 0 % (ref 0.0–0.2)

## 2022-12-25 LAB — SAVE SMEAR(SSMR), FOR PROVIDER SLIDE REVIEW

## 2022-12-25 LAB — IRON AND IRON BINDING CAPACITY (CC-WL,HP ONLY)
Iron: 33 ug/dL — ABNORMAL LOW (ref 45–182)
Saturation Ratios: 7 % — ABNORMAL LOW (ref 17.9–39.5)
TIBC: 473 ug/dL — ABNORMAL HIGH (ref 250–450)
UIBC: 440 ug/dL — ABNORMAL HIGH (ref 117–376)

## 2022-12-25 LAB — LACTATE DEHYDROGENASE: LDH: 145 U/L (ref 98–192)

## 2022-12-25 LAB — FERRITIN: Ferritin: 6 ng/mL — ABNORMAL LOW (ref 24–336)

## 2022-12-25 NOTE — Progress Notes (Signed)
Hematology and Oncology Follow Up Visit  Thomas Hancock [id number] [birth year] 67 y.o. 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  Thomas Hancock comes in with Thomas Hancock wife.  Thomas Hancock is now retired.  I am very happy for him.  Thomas Hancock seems to be doing okay.  Thomas Hancock does have Thomas nail infection in the middle finger of the left hand.  It also seems to be Thomas possible nail infection on the nailbed of the big toe of the right foot.  I do not think that this is from the [facility] that Thomas Hancock is taking.  I would think that this was for Hydrea that multiple nails would be affected.  I think Thomas Hancock probably does need to see dermatology.  Of course, Thomas Hancock does not have Thomas dermatologist.  We will have to see about making Thomas referral.  Thomas Hancock had iron studies done back in March.  At that time, Thomas Hancock ferritin was 8 with an iron saturation of 8%.  Thomas Hancock has had no problems with the renal cell carcinoma.  Thomas Hancock is being followed by Urology for this.  Thomas Hancock has had no issues with the hemochromatosis since Thomas Hancock gets phlebotomized on occasion.  Thomas Hancock has had no problems with COVID.  So far, I think the memory issues have certainly not worsened.  Overall, I would say Thomas Hancock performance status is probably ECOG 1.     Medications:  Allergies as of 12/25/2022   No Known Allergies      Medication List        Accurate as of December 25, 2022  2:20 PM. If you have any questions, ask your nurse or doctor.          ASPIRIN 81 PO Take 1 tablet by mouth daily.   atorvastatin 10 MG tablet Commonly known as: LIPITOR TAKE 1 TABLET BY MOUTH ONCE  DAILY.   B-12 PO Take 1 tablet by mouth daily. Unknown strength   hydroxyurea 500 MG capsule Commonly known as: HYDREA TAKE 1 CAPSULE BY MOUTH TWICE DAILY WITH MEALS TO  MINIMIZE  GI  SIDE  EFFECTS   hydroxyurea 500 MG capsule Commonly known as: HYDREA Take 1 capsule (500 mg total) by mouth 2 (two) times daily. May take with food to minimize GI side effects.   sertraline 50 MG tablet Commonly known as: ZOLOFT Take 1 tablet (50 mg total) by mouth daily.        Allergies: No Known Allergies  Past Medical History, Surgical history, Social history, and Family History were reviewed and updated.  Review of Systems: Review of Systems  Constitutional: Negative.   HENT: Negative.    Eyes: Negative.   Respiratory: Negative.    Cardiovascular: Negative.   Gastrointestinal:  Positive for abdominal pain.  Genitourinary: Negative.   Musculoskeletal: Negative.   Skin: Negative.   Neurological: Negative.   Endo/Heme/Allergies: Negative.   Psychiatric/Behavioral: Negative.       Physical Exam:  height is 6' (1.829 m) and weight is 197 lb (89.4 kg). Thomas Hancock oral temperature is 98.7 F (37.1 C). Thomas Hancock blood pressure  is 115/76 and Thomas Hancock pulse is 96. Thomas Hancock respiration is 18 and oxygen saturation is 98%.   Wt Readings from Last 3 Encounters:  12/25/22 197 lb (89.4 kg)  12/01/22 196 lb 12.8 oz (89.3 kg)  10/31/22 196 lb 12.8 oz (89.3 kg)    Physical Exam Vitals reviewed.  HENT:     Head: Normocephalic and atraumatic.  Eyes:     Pupils: Pupils are equal, round, and reactive to light.  Cardiovascular:     Rate and Rhythm: Normal rate and regular rhythm.     Heart sounds: Normal heart sounds.  Pulmonary:     Effort: Pulmonary effort is normal.     Breath sounds: Normal breath sounds.  Abdominal:     General: Bowel sounds are normal.     Palpations: Abdomen is soft.     Comments: Abdominal exam shows healing laparoscopy scars.  I think Thomas Hancock has 5 scars.  They are well-healed.  There is no  erythema.  There is no exudate.  Thomas Hancock abdomen is slightly distended.  Thomas Hancock has slightly decreased bowel sounds.  There is no guarding.  Thomas Hancock has no palpable abdominal mass.  Is no palpable liver or spleen tip.  Musculoskeletal:        General: No tenderness or deformity. Normal range of motion.     Cervical back: Normal range of motion.  Lymphadenopathy:     Cervical: No cervical adenopathy.  Skin:    General: Skin is warm and dry.     Findings: No erythema or rash.  Neurological:     Mental Status: Thomas Hancock is alert and oriented to person, place, and time.  Psychiatric:        Behavior: Behavior normal.        Thought Content: Thought content normal.        Judgment: Judgment normal.      Lab Results  Component Value Date   WBC 6.2 12/25/2022   HGB 13.3 12/25/2022   HCT 42.0 12/25/2022   MCV 87.9 12/25/2022   PLT 400 12/25/2022   Lab Results  Component Value Date   FERRITIN 8 (L) 09/21/2022   IRON 35 (L) 09/21/2022   TIBC 462 (H) 09/21/2022   UIBC 427 (H) 09/21/2022   IRONPCTSAT 8 (L) 09/21/2022   Lab Results  Component Value Date   RETICCTPCT 1.2 07/08/2020   RBC 4.78 12/25/2022   No results found for: "KPAFRELGTCHN", "LAMBDASER", "KAPLAMBRATIO" No results found for: "IGGSERUM", "IGA", "IGMSERUM" No results found for: "TOTALPROTELP", "ALBUMINELP", "A1GS", "A2GS", "BETS", "BETA2SER", "GAMS", "MSPIKE", "SPEI"   Chemistry      Component Value Date/Time   NA 139 12/25/2022 1318   K 3.7 12/25/2022 1318   CL 105 12/25/2022 1318   CO2 24 12/25/2022 1318   BUN 24 (H) 12/25/2022 1318   CREATININE 1.21 12/25/2022 1318      Component Value Date/Time   CALCIUM 9.5 12/25/2022 1318   ALKPHOS 73 12/25/2022 1318   AST 20 12/25/2022 1318   ALT 14 12/25/2022 1318   BILITOT 0.6 12/25/2022 1318       Impression and Plan: Thomas Hancock is Thomas very pleasant 67 yo [name] with Polycythemia vera -  JAK 2 positive; 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  Thomas Hancock does not need to be phlebotomized today.  This part I am happy about.  Again, it looks like Thomas Hancock does have Thomas nail infection on the middle finger of the left hand.  Hopefully,  we can get him to Dermatology for follow-up.  We will go ahead and plan to get him back in 3 months.  [name], MD 7/1/20242:20 PM

## 2023-03-28 ENCOUNTER — Encounter: Payer: Self-pay | Admitting: Hematology & Oncology

## 2023-03-28 ENCOUNTER — Inpatient Hospital Stay: Payer: Medicare Other | Attending: Hematology & Oncology

## 2023-03-28 ENCOUNTER — Inpatient Hospital Stay (HOSPITAL_BASED_OUTPATIENT_CLINIC_OR_DEPARTMENT_OTHER): Payer: Medicaid Other | Admitting: Hematology & Oncology

## 2023-03-28 ENCOUNTER — Inpatient Hospital Stay: Payer: Medicare Other

## 2023-03-28 VITALS — BP 121/79 | HR 80 | Temp 98.7°F | Resp 20 | Ht 72.0 in | Wt 198.1 lb

## 2023-03-28 DIAGNOSIS — D45 Polycythemia vera: Secondary | ICD-10-CM | POA: Insufficient documentation

## 2023-03-28 DIAGNOSIS — Z79899 Other long term (current) drug therapy: Secondary | ICD-10-CM | POA: Diagnosis not present

## 2023-03-28 DIAGNOSIS — C641 Malignant neoplasm of right kidney, except renal pelvis: Secondary | ICD-10-CM | POA: Diagnosis not present

## 2023-03-28 LAB — CMP (CANCER CENTER ONLY)
ALT: 16 U/L (ref 0–44)
AST: 22 U/L (ref 15–41)
Albumin: 4.3 g/dL (ref 3.5–5.0)
Alkaline Phosphatase: 79 U/L (ref 38–126)
Anion gap: 9 (ref 5–15)
BUN: 20 mg/dL (ref 8–23)
CO2: 24 mmol/L (ref 22–32)
Calcium: 9 mg/dL (ref 8.9–10.3)
Chloride: 105 mmol/L (ref 98–111)
Creatinine: 1.02 mg/dL (ref 0.61–1.24)
GFR, Estimated: >60 mL/min (ref >60)
Glucose, Bld: 131 mg/dL — ABNORMAL HIGH (ref 70–99)
Potassium: 3.9 mmol/L (ref 3.5–5.1)
Sodium: 138 mmol/L (ref 135–145)
Total Bilirubin: 0.5 mg/dL (ref 0.3–1.2)
Total Protein: 7.7 g/dL (ref 6.5–8.1)

## 2023-03-28 LAB — CBC WITH DIFFERENTIAL (CANCER CENTER ONLY)
Abs Immature Granulocytes: 0.02 10*3/uL (ref 0.00–0.07)
Basophils Absolute: 0.1 10*3/uL (ref 0.0–0.1)
Basophils Relative: 2 %
Eosinophils Absolute: 0.2 10*3/uL (ref 0.0–0.5)
Eosinophils Relative: 2 %
HCT: 42.5 % (ref 39.0–52.0)
Hemoglobin: 13.6 g/dL (ref 13.0–17.0)
Immature Granulocytes: 0 %
Lymphocytes Relative: 20 %
Lymphs Abs: 1.3 10*3/uL (ref 0.7–4.0)
MCH: 27.6 pg (ref 26.0–34.0)
MCHC: 32 g/dL (ref 30.0–36.0)
MCV: 86.4 fL (ref 80.0–100.0)
Monocytes Absolute: 0.6 10*3/uL (ref 0.1–1.0)
Monocytes Relative: 9 %
Neutro Abs: 4.2 10*3/uL (ref 1.7–7.7)
Neutrophils Relative %: 67 %
Platelet Count: 482 10*3/uL — ABNORMAL HIGH (ref 150–400)
RBC: 4.92 MIL/uL (ref 4.22–5.81)
RDW: 14.5 % (ref 11.5–15.5)
WBC Count: 6.3 10*3/uL (ref 4.0–10.5)
nRBC: 0 % (ref 0.0–0.2)

## 2023-03-28 LAB — FERRITIN: Ferritin: 7 ng/mL — ABNORMAL LOW (ref 24–336)

## 2023-03-28 LAB — LACTATE DEHYDROGENASE: LDH: 138 U/L (ref 98–192)

## 2023-03-28 NOTE — Progress Notes (Signed)
Hematology and Oncology Follow Up Visit  Domenick Chatmon [id number] 02/21/1956 67 y.o. 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 twice daily on 03/28/2023 Phlebotomy as indicated to maintain ferritin < 100 and iron saturation < 50% Phlebotomy to keep hematocrit below 45% EC ASA 81 mg po q day   Interim History:  Mr. Hardeman is here today for follow-up.  He comes in with his daughter.  Overall, he is doing okay.  He is retired.  He has been trying to do things around the house.  He has had no complaints since we last saw him.  He has yet to see a dermatologist about the fingernails.  Again it looks like he may have some fungal infection in the nails.  He has had no fever.  He has had no cough or shortness of breath.  There has been no change in bowel or bladder habits.  He has had no rashes.  There has been no bleeding.  He has had no leg swelling.  He does have hemochromatosis.  However, his last iron studies showed a ferritin of 6 with an iron saturation of 7%.  Overall, I would have said that his performance status is probably ECOG 1.     Medications:  Allergies as of 03/28/2023   No Known Allergies      Medication List        Accurate as of March 28, 2023  2:18 PM. If you have any questions, ask your nurse or doctor.          acetaminophen 500 MG tablet Commonly known as: TYLENOL Take 500 mg by mouth every 6 (six) hours as needed.   ASPIRIN 81 PO Take 1 tablet by mouth daily.   atorvastatin 10 MG tablet Commonly known as: LIPITOR TAKE 1 TABLET BY MOUTH ONCE DAILY.   B-12 PO Take 1 tablet by mouth daily. Unknown strength   hydroxyurea 500 MG capsule Commonly known as: HYDREA TAKE 1 CAPSULE BY MOUTH TWICE  DAILY WITH MEALS TO  MINIMIZE  GI  SIDE  EFFECTS What changed:  how much to take how to take this when to take this   ibuprofen 200 MG tablet Commonly known as: ADVIL Take 400 mg by mouth 2 (two) times daily.   MULTIVITAMIN ADULTS PO Take by mouth daily.   sertraline 50 MG tablet Commonly known as: ZOLOFT Take 1 tablet (50 mg total) by mouth daily.        Allergies: No Known Allergies  Past Medical History, Surgical history, Social history, and Family History were reviewed and updated.  Review of Systems: Review of Systems  Constitutional: Negative.   HENT: Negative.    Eyes: Negative.   Respiratory: Negative.    Cardiovascular: Negative.   Gastrointestinal:  Positive for abdominal pain.  Genitourinary: Negative.   Musculoskeletal: Negative.   Skin: Negative.   Neurological: Negative.   Endo/Heme/Allergies: Negative.   Psychiatric/Behavioral: Negative.       Physical Exam:  height is 6' (1.829 m) and weight is 198 lb 1.3 oz (89.8 kg). His oral temperature is 98.7 F (37.1 C). His blood pressure is 121/79 and his pulse is 80. His respiration is 20 and oxygen saturation is 97%.   Wt Readings from  Last 3 Encounters:  03/28/23 198 lb 1.3 oz (89.8 kg)  12/25/22 197 lb (89.4 kg)  12/01/22 196 lb 12.8 oz (89.3 kg)    Physical Exam Vitals reviewed.  HENT:     Head: Normocephalic and atraumatic.  Eyes:     Pupils: Pupils are equal, round, and reactive to light.  Cardiovascular:     Rate and Rhythm: Normal rate and regular rhythm.     Heart sounds: Normal heart sounds.  Pulmonary:     Effort: Pulmonary effort is normal.     Breath sounds: Normal breath sounds.  Abdominal:     General: Bowel sounds are normal.     Palpations: Abdomen is soft.     Comments: Abdominal exam shows healing laparoscopy scars.  I think he has 5 scars.  They are well-healed.  There is no erythema.  There is no exudate.  His abdomen is slightly distended.  He has slightly decreased  bowel sounds.  There is no guarding.  He has no palpable abdominal mass.  Is no palpable liver or spleen tip.  Musculoskeletal:        General: No tenderness or deformity. Normal range of motion.     Cervical back: Normal range of motion.  Lymphadenopathy:     Cervical: No cervical adenopathy.  Skin:    General: Skin is warm and dry.     Findings: No erythema or rash.  Neurological:     Mental Status: He is alert and oriented to person, place, and time.  Psychiatric:        Behavior: Behavior normal.        Thought Content: Thought content normal.        Judgment: Judgment normal.      Lab Results  Component Value Date   WBC 6.3 03/28/2023   HGB 13.6 03/28/2023   HCT 42.5 03/28/2023   MCV 86.4 03/28/2023   PLT 482 (H) 03/28/2023   Lab Results  Component Value Date   FERRITIN 6 (L) 12/25/2022   IRON 33 (L) 12/25/2022   TIBC 473 (H) 12/25/2022   UIBC 440 (H) 12/25/2022   IRONPCTSAT 7 (L) 12/25/2022   Lab Results  Component Value Date   RETICCTPCT 1.2 07/08/2020   RBC 4.92 03/28/2023   No results found for: "KPAFRELGTCHN", "LAMBDASER", "KAPLAMBRATIO" No results found for: "IGGSERUM", "IGA", "IGMSERUM" No results found for: "TOTALPROTELP", "ALBUMINELP", "A1GS", "A2GS", "BETS", "BETA2SER", "GAMS", "MSPIKE", "SPEI"   Chemistry      Component Value Date/Time   NA 138 03/28/2023 1329   K 3.9 03/28/2023 1329   CL 105 03/28/2023 1329   CO2 24 03/28/2023 1329   BUN 20 03/28/2023 1329   CREATININE 1.02 03/28/2023 1329      Component Value Date/Time   CALCIUM 9.0 03/28/2023 1329   ALKPHOS 79 03/28/2023 1329   AST 22 03/28/2023 1329   ALT 16 03/28/2023 1329   BILITOT 0.5 03/28/2023 1329       Impression and Plan: Mr. 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  He does not need to be phlebotomized today.  This part I am happy  about.  Again, we will increase the Hydrea to 500 mg p.o. twice daily.  Hopefully, this will help bring the platelet count down a little bit.  I would like to see him back in about 6 weeks now.  Rose Phi  Myna Hidalgo, MD 10/2/20242:18 PM

## 2023-03-29 LAB — IRON AND IRON BINDING CAPACITY (CC-WL,HP ONLY)
Iron: 37 ug/dL — ABNORMAL LOW (ref 45–182)
Saturation Ratios: 8 % — ABNORMAL LOW (ref 17.9–39.5)
TIBC: 465 ug/dL — ABNORMAL HIGH (ref 250–450)
UIBC: 428 ug/dL — ABNORMAL HIGH (ref 117–376)

## 2023-03-30 ENCOUNTER — Encounter: Payer: Self-pay | Admitting: Family Medicine

## 2023-04-25 ENCOUNTER — Ambulatory Visit (HOSPITAL_COMMUNITY)
Admission: RE | Admit: 2023-04-25 | Discharge: 2023-04-25 | Disposition: A | Discharge status: Home / Self Care | Payer: Medicare Other | Source: Ambulatory Visit | Attending: Urology | Admitting: Urology

## 2023-04-25 ENCOUNTER — Other Ambulatory Visit (HOSPITAL_COMMUNITY): Payer: Self-pay | Admitting: Urology

## 2023-04-25 DIAGNOSIS — C641 Malignant neoplasm of right kidney, except renal pelvis: Secondary | ICD-10-CM

## 2023-04-25 DIAGNOSIS — Z85528 Personal history of other malignant neoplasm of kidney: Secondary | ICD-10-CM | POA: Diagnosis not present

## 2023-04-25 DIAGNOSIS — R948 Abnormal results of function studies of other organs and systems: Secondary | ICD-10-CM | POA: Diagnosis not present

## 2023-05-02 DIAGNOSIS — C641 Malignant neoplasm of right kidney, except renal pelvis: Secondary | ICD-10-CM | POA: Diagnosis not present

## 2023-05-09 ENCOUNTER — Inpatient Hospital Stay: Payer: Medicare Other | Attending: Hematology & Oncology

## 2023-05-09 ENCOUNTER — Inpatient Hospital Stay: Payer: Medicare Other

## 2023-05-09 ENCOUNTER — Encounter: Payer: Self-pay | Admitting: Hematology & Oncology

## 2023-05-09 ENCOUNTER — Inpatient Hospital Stay (HOSPITAL_BASED_OUTPATIENT_CLINIC_OR_DEPARTMENT_OTHER): Payer: Medicare Other | Admitting: Hematology & Oncology

## 2023-05-09 ENCOUNTER — Other Ambulatory Visit: Payer: Self-pay

## 2023-05-09 VITALS — BP 122/78 | HR 85 | Temp 98.9°F | Resp 18 | Wt 198.0 lb

## 2023-05-09 DIAGNOSIS — D45 Polycythemia vera: Secondary | ICD-10-CM

## 2023-05-09 DIAGNOSIS — Z79899 Other long term (current) drug therapy: Secondary | ICD-10-CM | POA: Insufficient documentation

## 2023-05-09 DIAGNOSIS — C641 Malignant neoplasm of right kidney, except renal pelvis: Secondary | ICD-10-CM

## 2023-05-09 DIAGNOSIS — Z85528 Personal history of other malignant neoplasm of kidney: Secondary | ICD-10-CM | POA: Insufficient documentation

## 2023-05-09 LAB — CMP (CANCER CENTER ONLY)
ALT: 20 U/L (ref 0–44)
AST: 25 U/L (ref 15–41)
Albumin: 4.3 g/dL (ref 3.5–5.0)
Alkaline Phosphatase: 69 U/L (ref 38–126)
Anion gap: 9 (ref 5–15)
BUN: 22 mg/dL (ref 8–23)
CO2: 23 mmol/L (ref 22–32)
Calcium: 8.7 mg/dL — ABNORMAL LOW (ref 8.9–10.3)
Chloride: 105 mmol/L (ref 98–111)
Creatinine: 1.11 mg/dL (ref 0.61–1.24)
GFR, Estimated: >60 mL/min (ref >60)
Glucose, Bld: 157 mg/dL — ABNORMAL HIGH (ref 70–99)
Potassium: 3.7 mmol/L (ref 3.5–5.1)
Sodium: 137 mmol/L (ref 135–145)
Total Bilirubin: 0.6 mg/dL (ref <1.2)
Total Protein: 8 g/dL (ref 6.5–8.1)

## 2023-05-09 LAB — CBC WITH DIFFERENTIAL (CANCER CENTER ONLY)
Abs Immature Granulocytes: 0.02 10*3/uL (ref 0.00–0.07)
Basophils Absolute: 0.1 10*3/uL (ref 0.0–0.1)
Basophils Relative: 3 %
Eosinophils Absolute: 0.1 10*3/uL (ref 0.0–0.5)
Eosinophils Relative: 2 %
HCT: 43 % (ref 39.0–52.0)
Hemoglobin: 14.3 g/dL (ref 13.0–17.0)
Immature Granulocytes: 0 %
Lymphocytes Relative: 22 %
Lymphs Abs: 1 10*3/uL (ref 0.7–4.0)
MCH: 30.2 pg (ref 26.0–34.0)
MCHC: 33.3 g/dL (ref 30.0–36.0)
MCV: 90.9 fL (ref 80.0–100.0)
Monocytes Absolute: 0.4 10*3/uL (ref 0.1–1.0)
Monocytes Relative: 9 %
Neutro Abs: 3 10*3/uL (ref 1.7–7.7)
Neutrophils Relative %: 64 %
Platelet Count: 258 10*3/uL (ref 150–400)
RBC: 4.73 MIL/uL (ref 4.22–5.81)
RDW: 19.3 % — ABNORMAL HIGH (ref 11.5–15.5)
WBC Count: 4.7 10*3/uL (ref 4.0–10.5)
nRBC: 0 % (ref 0.0–0.2)

## 2023-05-09 NOTE — Progress Notes (Signed)
Hematology and Oncology Follow Up Visit  Thomas Hancock [id number] [birth year] 67 y.o. 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 twice daily on 03/28/2023 Phlebotomy as indicated to maintain ferritin < 100 and iron saturation < 50% Phlebotomy to keep hematocrit below 45% EC ASA 81 mg po q day   Interim History:  Thomas Hancock is here today for follow-up.  He comes in with his daughter.  We last saw him about 6 weeks ago.  We did change his Hydrea dose.  This is worked quite nicely.  His platelet count is coming down nicely.  He has had no problems with nausea or vomiting.  His daughter says that he is eating little bit more right now.  He has had no fever.  He has had no cough or shortness of breath.  He has had no headache.  There has been no leg swelling.  He has had no rashes..  He sees a urologist yearly for his renal cell carcinoma.  He had a CT scan about a week ago.  They do not know the results yet.  He has had no problems with the hemochromatosis.  When we last saw him, his ferritin was 7 with iron saturation of 8%.  Currently, his performance status is ECOG 0.    Medications:  Allergies as of 05/09/2023   No Known Allergies      Medication List        Accurate as of May 09, 2023 10:25 AM. If you have any questions, ask your nurse or doctor.          acetaminophen 500 MG tablet Commonly known as: TYLENOL Take 500 mg by mouth every 6 (six) hours as needed.   ASPIRIN 81 PO Take 1 tablet by mouth daily.   atorvastatin 10 MG tablet Commonly known as: LIPITOR TAKE 1 TABLET BY MOUTH ONCE DAILY.   B-12 PO Take 1 tablet by mouth daily. Unknown strength   hydroxyurea 500 MG capsule Commonly known as:  HYDREA TAKE 1 CAPSULE BY MOUTH TWICE DAILY WITH MEALS TO  MINIMIZE  GI  SIDE  EFFECTS What changed:  how much to take how to take this when to take this   ibuprofen 200 MG tablet Commonly known as: ADVIL Take 400 mg by mouth 2 (two) times daily.   MULTIVITAMIN ADULTS PO Take by mouth daily.   sertraline 50 MG tablet Commonly known as: ZOLOFT Take 1 tablet (50 mg total) by mouth daily.        Allergies: No Known Allergies  Past Medical History, Surgical history, Social history, and Family History were reviewed and updated.  Review of Systems: Review of Systems  Constitutional: Negative.   HENT: Negative.    Eyes: Negative.   Respiratory: Negative.    Cardiovascular: Negative.   Gastrointestinal:  Positive for abdominal pain.  Genitourinary: Negative.   Musculoskeletal: Negative.   Skin: Negative.   Neurological: Negative.   Endo/Heme/Allergies: Negative.   Psychiatric/Behavioral: Negative.       Physical Exam:  weight is 198 lb (89.8 kg). His oral temperature is 98.9 F (37.2 C). His blood pressure is 122/78 and his pulse is 85. His respiration is 18 and oxygen saturation is 99%.   Wt Readings from  Last 3 Encounters:  05/09/23 198 lb (89.8 kg)  03/28/23 198 lb 1.3 oz (89.8 kg)  12/25/22 197 lb (89.4 kg)    Physical Exam Vitals reviewed.  HENT:     Head: Normocephalic and atraumatic.  Eyes:     Pupils: Pupils are equal, round, and reactive to light.  Cardiovascular:     Rate and Rhythm: Normal rate and regular rhythm.     Heart sounds: Normal heart sounds.  Pulmonary:     Effort: Pulmonary effort is normal.     Breath sounds: Normal breath sounds.  Abdominal:     General: Bowel sounds are normal.     Palpations: Abdomen is soft.     Comments: Abdominal exam shows healing laparoscopy scars.  I think he has 5 scars.  They are well-healed.  There is no erythema.  There is no exudate.  His abdomen is slightly distended.  He has slightly decreased bowel  sounds.  There is no guarding.  He has no palpable abdominal mass.  Is no palpable liver or spleen tip.  Musculoskeletal:        General: No tenderness or deformity. Normal range of motion.     Cervical back: Normal range of motion.  Lymphadenopathy:     Cervical: No cervical adenopathy.  Skin:    General: Skin is warm and dry.     Findings: No erythema or rash.  Neurological:     Mental Status: He is alert and oriented to person, place, and time.  Psychiatric:        Behavior: Behavior normal.        Thought Content: Thought content normal.        Judgment: Judgment normal.      Lab Results  Component Value Date   WBC 4.7 05/09/2023   HGB 14.3 05/09/2023   HCT 43.0 05/09/2023   MCV 90.9 05/09/2023   PLT 258 05/09/2023   Lab Results  Component Value Date   FERRITIN 7 (L) 03/28/2023   IRON 37 (L) 03/28/2023   TIBC 465 (H) 03/28/2023   UIBC 428 (H) 03/28/2023   IRONPCTSAT 8 (L) 03/28/2023   Lab Results  Component Value Date   RETICCTPCT 1.2 07/08/2020   RBC 4.73 05/09/2023   No results found for: "KPAFRELGTCHN", "LAMBDASER", "KAPLAMBRATIO" No results found for: "IGGSERUM", "IGA", "IGMSERUM" No results found for: "TOTALPROTELP", "ALBUMINELP", "A1GS", "A2GS", "BETS", "BETA2SER", "GAMS", "MSPIKE", "SPEI"   Chemistry      Component Value Date/Time   NA 138 03/28/2023 1329   K 3.9 03/28/2023 1329   CL 105 03/28/2023 1329   CO2 24 03/28/2023 1329   BUN 20 03/28/2023 1329   CREATININE 1.02 03/28/2023 1329      Component Value Date/Time   CALCIUM 9.0 03/28/2023 1329   ALKPHOS 79 03/28/2023 1329   AST 22 03/28/2023 1329   ALT 16 03/28/2023 1329   BILITOT 0.5 03/28/2023 1329       Impression and Plan: Thomas Hancock is a very pleasant 67 yo [place] gentleman with Polycythemia vera -  JAK 2 positive; 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  He does not need to be phlebotomized today.  This part I am happy about.  We will  now plan to get him through the Holiday season.  I will plan to get him back to see me in January.  Thomas Macho, MD 11/13/202410:25 AM

## 2023-05-28 DIAGNOSIS — R351 Nocturia: Secondary | ICD-10-CM | POA: Diagnosis not present

## 2023-05-28 DIAGNOSIS — C641 Malignant neoplasm of right kidney, except renal pelvis: Secondary | ICD-10-CM | POA: Diagnosis not present

## 2023-05-28 DIAGNOSIS — N401 Enlarged prostate with lower urinary tract symptoms: Secondary | ICD-10-CM | POA: Diagnosis not present

## 2023-06-29 ENCOUNTER — Other Ambulatory Visit: Payer: Self-pay | Admitting: Family Medicine

## 2023-07-11 ENCOUNTER — Inpatient Hospital Stay: Payer: Medicare Other

## 2023-07-11 ENCOUNTER — Encounter: Payer: Self-pay | Admitting: Hematology & Oncology

## 2023-07-11 ENCOUNTER — Inpatient Hospital Stay: Payer: Medicare Other | Attending: Hematology & Oncology

## 2023-07-11 ENCOUNTER — Inpatient Hospital Stay (HOSPITAL_BASED_OUTPATIENT_CLINIC_OR_DEPARTMENT_OTHER): Payer: Medicare Other | Admitting: Hematology & Oncology

## 2023-07-11 VITALS — BP 131/78 | HR 83 | Temp 98.6°F | Resp 20 | Ht 72.0 in | Wt 200.0 lb

## 2023-07-11 DIAGNOSIS — D45 Polycythemia vera: Secondary | ICD-10-CM

## 2023-07-11 DIAGNOSIS — Z85528 Personal history of other malignant neoplasm of kidney: Secondary | ICD-10-CM | POA: Diagnosis not present

## 2023-07-11 DIAGNOSIS — Z79899 Other long term (current) drug therapy: Secondary | ICD-10-CM | POA: Insufficient documentation

## 2023-07-11 DIAGNOSIS — C641 Malignant neoplasm of right kidney, except renal pelvis: Secondary | ICD-10-CM | POA: Diagnosis not present

## 2023-07-11 DIAGNOSIS — Z7982 Long term (current) use of aspirin: Secondary | ICD-10-CM | POA: Insufficient documentation

## 2023-07-11 LAB — CBC WITH DIFFERENTIAL (CANCER CENTER ONLY)
Abs Immature Granulocytes: 0.01 10*3/uL (ref 0.00–0.07)
Basophils Absolute: 0.1 10*3/uL (ref 0.0–0.1)
Basophils Relative: 2 %
Eosinophils Absolute: 0.1 10*3/uL (ref 0.0–0.5)
Eosinophils Relative: 1 %
HCT: 41.7 % (ref 39.0–52.0)
Hemoglobin: 14.6 g/dL (ref 13.0–17.0)
Immature Granulocytes: 0 %
Lymphocytes Relative: 23 %
Lymphs Abs: 0.9 10*3/uL (ref 0.7–4.0)
MCH: 35.3 pg — ABNORMAL HIGH (ref 26.0–34.0)
MCHC: 35 g/dL (ref 30.0–36.0)
MCV: 100.7 fL — ABNORMAL HIGH (ref 80.0–100.0)
Monocytes Absolute: 0.6 10*3/uL (ref 0.1–1.0)
Monocytes Relative: 15 %
Neutro Abs: 2.4 10*3/uL (ref 1.7–7.7)
Neutrophils Relative %: 59 %
Platelet Count: 246 10*3/uL (ref 150–400)
RBC: 4.14 MIL/uL — ABNORMAL LOW (ref 4.22–5.81)
RDW: 17.6 % — ABNORMAL HIGH (ref 11.5–15.5)
WBC Count: 4.1 10*3/uL (ref 4.0–10.5)
nRBC: 0 % (ref 0.0–0.2)

## 2023-07-11 LAB — FERRITIN: Ferritin: 16 ng/mL — ABNORMAL LOW (ref 24–336)

## 2023-07-11 LAB — CMP (CANCER CENTER ONLY)
ALT: 25 U/L (ref 0–44)
AST: 24 U/L (ref 15–41)
Albumin: 4.5 g/dL (ref 3.5–5.0)
Alkaline Phosphatase: 69 U/L (ref 38–126)
Anion gap: 10 (ref 5–15)
BUN: 19 mg/dL (ref 8–23)
CO2: 25 mmol/L (ref 22–32)
Calcium: 9.3 mg/dL (ref 8.9–10.3)
Chloride: 105 mmol/L (ref 98–111)
Creatinine: 1 mg/dL (ref 0.61–1.24)
GFR, Estimated: >60 mL/min (ref >60)
Glucose, Bld: 134 mg/dL — ABNORMAL HIGH (ref 70–99)
Potassium: 3.9 mmol/L (ref 3.5–5.1)
Sodium: 140 mmol/L (ref 135–145)
Total Bilirubin: 0.8 mg/dL (ref 0.0–1.2)
Total Protein: 7.8 g/dL (ref 6.5–8.1)

## 2023-07-11 LAB — IRON AND IRON BINDING CAPACITY (CC-WL,HP ONLY)
Iron: 109 ug/dL (ref 45–182)
Saturation Ratios: 26 % (ref 17.9–39.5)
TIBC: 427 ug/dL (ref 250–450)
UIBC: 318 ug/dL (ref 117–376)

## 2023-07-11 LAB — LACTATE DEHYDROGENASE: LDH: 160 U/L (ref 98–192)

## 2023-07-11 NOTE — Progress Notes (Signed)
 Hematology and Oncology Follow Up Visit  Thomas Hancock [id number] March 21, 1956 68 y.o. 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 twice daily on 03/28/2023 Phlebotomy as indicated to maintain ferritin < 100 and iron saturation < 50% Phlebotomy to keep hematocrit below 45% EC ASA 81 mg po q day   Interim History:  [name] is here today for follow-up.  So far, he is doing quite well.  He had no problems over the holidays.  The big news is that his son from [place] is moving his family up here.  The rehab around [place].  This will make Dinah Franco a whole easier for him.  He is doing well with his health.  He is on Hydrea  twice a day now.  This really is helping his platelets.  He has had no problems with bleeding.  He has had no cough or shortness of breath.  He has had no problems with COVID.  As far as hemochromatosis, he has had no problems with this.  His ferritin was 7 with an iron saturation of 8%.  He has had no change in bowel or bladder habits.  Urology is watching his renal cell carcinoma.  Had think he had a CT scan done in the Fall.  Everything looks fine.  Overall, I would say that his performance status is probably ECOG 0.    Medications:  Allergies as of 07/11/2023   No Known Allergies      Medication List        Accurate as of July 11, 2023 11:15 AM. If you have any questions, ask your nurse or doctor.          acetaminophen  500 MG tablet Commonly known as: TYLENOL  Take 500 mg by mouth every 6 (six) hours as needed.   ASPIRIN 81 PO Take 1 tablet by mouth daily.   atorvastatin  10 MG tablet Commonly known as: LIPITOR TAKE 1 TABLET DAILY   B-12 PO Take 1 tablet by mouth daily. Unknown strength    hydroxyurea  500 MG capsule Commonly known as: HYDREA  TAKE 1 CAPSULE BY MOUTH TWICE DAILY WITH MEALS TO  MINIMIZE  GI  SIDE  EFFECTS What changed:  how much to take how to take this when to take this   ibuprofen 200 MG tablet Commonly known as: ADVIL Take 400 mg by mouth 2 (two) times daily as needed.   MULTIVITAMIN ADULTS PO Take by mouth daily.   sertraline  50 MG tablet Commonly known as: ZOLOFT  TAKE 1 TABLET DAILY        Allergies: No Known Allergies  Past Medical History, Surgical history, Social history, and Family History were reviewed and updated.  Review of Systems: Review of Systems  Constitutional: Negative.   HENT: Negative.    Eyes: Negative.   Respiratory: Negative.    Cardiovascular: Negative.   Gastrointestinal:  Positive for abdominal pain.  Genitourinary: Negative.   Musculoskeletal: Negative.   Skin: Negative.   Neurological: Negative.   Endo/Heme/Allergies: Negative.   Psychiatric/Behavioral: Negative.       Physical Exam:  height is 6' (1.829 m) and weight is 200 lb (90.7 kg). His oral temperature is 98.6 F (37 C). His blood pressure is 131/78 and his pulse is 83. His respiration  is 20 and oxygen saturation is 99%.   Wt Readings from Last 3 Encounters:  07/11/23 200 lb (90.7 kg)  05/09/23 198 lb (89.8 kg)  03/28/23 198 lb 1.3 oz (89.8 kg)    Physical Exam Vitals reviewed.  HENT:     Head: Normocephalic and atraumatic.  Eyes:     Pupils: Pupils are equal, round, and reactive to light.  Cardiovascular:     Rate and Rhythm: Normal rate and regular rhythm.     Heart sounds: Normal heart sounds.  Pulmonary:     Effort: Pulmonary effort is normal.     Breath sounds: Normal breath sounds.  Abdominal:     General: Bowel sounds are normal.     Palpations: Abdomen is soft.     Comments: Abdominal exam shows healing laparoscopy scars.  I think he has 5 scars.  They are well-healed.  There is no erythema.  There is no exudate.  His  abdomen is slightly distended.  He has slightly decreased bowel sounds.  There is no guarding.  He has no palpable abdominal mass.  Is no palpable liver or spleen tip.  Musculoskeletal:        General: No tenderness or deformity. Normal range of motion.     Cervical back: Normal range of motion.  Lymphadenopathy:     Cervical: No cervical adenopathy.  Skin:    General: Skin is warm and dry.     Findings: No erythema or rash.  Neurological:     Mental Status: He is alert and oriented to person, place, and time.  Psychiatric:        Behavior: Behavior normal.        Thought Content: Thought content normal.        Judgment: Judgment normal.      Lab Results  Component Value Date   WBC 4.1 07/11/2023   HGB 14.6 07/11/2023   HCT 41.7 07/11/2023   MCV 100.7 (H) 07/11/2023   PLT 246 07/11/2023   Lab Results  Component Value Date   FERRITIN 7 (L) 03/28/2023   IRON 37 (L) 03/28/2023   TIBC 465 (H) 03/28/2023   UIBC 428 (H) 03/28/2023   IRONPCTSAT 8 (L) 03/28/2023   Lab Results  Component Value Date   RETICCTPCT 1.2 07/08/2020   RBC 4.14 (L) 07/11/2023   No results found for: "KPAFRELGTCHN", "LAMBDASER", "KAPLAMBRATIO" No results found for: "IGGSERUM", "IGA", "IGMSERUM" No results found for: "TOTALPROTELP", "ALBUMINELP", "A1GS", "A2GS", "BETS", "BETA2SER", "GAMS", "MSPIKE", "SPEI"   Chemistry      Component Value Date/Time   NA 140 07/11/2023 1007   K 3.9 07/11/2023 1007   CL 105 07/11/2023 1007   CO2 25 07/11/2023 1007   BUN 19 07/11/2023 1007   CREATININE 1.00 07/11/2023 1007      Component Value Date/Time   CALCIUM  9.3 07/11/2023 1007   ALKPHOS 69 07/11/2023 1007   AST 24 07/11/2023 1007   ALT 25 07/11/2023 1007   BILITOT 0.8 07/11/2023 1007       Impression and Plan: [name] is a very pleasant 68 yo [place] gentleman with Polycythemia vera -  JAK 2 positive; 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  He does  not need to be phlebotomized today.  This part I am happy about.  I am very happy for the fact that his son is moving up to [place] .  For now, we will plan to get him back in another 2 or  3 months.  We will get him through the Winter.   Ivor Mars, MD 1/15/202511:15 AM

## 2023-07-13 ENCOUNTER — Telehealth: Payer: Self-pay

## 2023-07-13 NOTE — Telephone Encounter (Signed)
Called pt lvm spoke with him to ask him a question. Ask for him to call our office back.

## 2023-08-06 ENCOUNTER — Encounter: Payer: Self-pay | Admitting: Family

## 2023-08-14 ENCOUNTER — Encounter: Payer: Self-pay | Admitting: Family

## 2023-08-21 ENCOUNTER — Encounter: Payer: Self-pay | Admitting: Family

## 2023-09-05 ENCOUNTER — Inpatient Hospital Stay (HOSPITAL_BASED_OUTPATIENT_CLINIC_OR_DEPARTMENT_OTHER): Payer: Medicare Other | Admitting: Hematology & Oncology

## 2023-09-05 ENCOUNTER — Encounter: Payer: Self-pay | Admitting: Hematology & Oncology

## 2023-09-05 ENCOUNTER — Inpatient Hospital Stay: Payer: Medicare Other

## 2023-09-05 ENCOUNTER — Inpatient Hospital Stay: Payer: Medicare Other | Attending: Hematology & Oncology

## 2023-09-05 VITALS — BP 122/80 | HR 77 | Temp 99.0°F | Resp 20 | Ht 72.0 in | Wt 204.0 lb

## 2023-09-05 DIAGNOSIS — D45 Polycythemia vera: Secondary | ICD-10-CM

## 2023-09-05 DIAGNOSIS — C641 Malignant neoplasm of right kidney, except renal pelvis: Secondary | ICD-10-CM | POA: Diagnosis not present

## 2023-09-05 LAB — IRON AND IRON BINDING CAPACITY (CC-WL,HP ONLY)
Iron: 105 ug/dL (ref 45–182)
Saturation Ratios: 29 % (ref 17.9–39.5)
TIBC: 357 ug/dL (ref 250–450)
UIBC: 252 ug/dL (ref 117–376)

## 2023-09-05 LAB — CBC WITH DIFFERENTIAL (CANCER CENTER ONLY)
Abs Immature Granulocytes: 0.06 10*3/uL (ref 0.00–0.07)
Basophils Absolute: 0.1 10*3/uL (ref 0.0–0.1)
Basophils Relative: 2 %
Eosinophils Absolute: 0.1 10*3/uL (ref 0.0–0.5)
Eosinophils Relative: 2 %
HCT: 38.8 % — ABNORMAL LOW (ref 39.0–52.0)
Hemoglobin: 13.7 g/dL (ref 13.0–17.0)
Immature Granulocytes: 1 %
Lymphocytes Relative: 25 %
Lymphs Abs: 1.1 10*3/uL (ref 0.7–4.0)
MCH: 38.5 pg — ABNORMAL HIGH (ref 26.0–34.0)
MCHC: 35.3 g/dL (ref 30.0–36.0)
MCV: 109 fL — ABNORMAL HIGH (ref 80.0–100.0)
Monocytes Absolute: 0.6 10*3/uL (ref 0.1–1.0)
Monocytes Relative: 14 %
Neutro Abs: 2.6 10*3/uL (ref 1.7–7.7)
Neutrophils Relative %: 56 %
Platelet Count: 272 10*3/uL (ref 150–400)
RBC: 3.56 MIL/uL — ABNORMAL LOW (ref 4.22–5.81)
RDW: 12.2 % (ref 11.5–15.5)
WBC Count: 4.6 10*3/uL (ref 4.0–10.5)
nRBC: 0 % (ref 0.0–0.2)

## 2023-09-05 LAB — CMP (CANCER CENTER ONLY)
ALT: 51 U/L — ABNORMAL HIGH (ref 0–44)
AST: 37 U/L (ref 15–41)
Albumin: 4.4 g/dL (ref 3.5–5.0)
Alkaline Phosphatase: 79 U/L (ref 38–126)
Anion gap: 8 (ref 5–15)
BUN: 19 mg/dL (ref 8–23)
CO2: 28 mmol/L (ref 22–32)
Calcium: 9.1 mg/dL (ref 8.9–10.3)
Chloride: 104 mmol/L (ref 98–111)
Creatinine: 1.05 mg/dL (ref 0.61–1.24)
GFR, Estimated: >60 mL/min (ref >60)
Glucose, Bld: 135 mg/dL — ABNORMAL HIGH (ref 70–99)
Potassium: 4 mmol/L (ref 3.5–5.1)
Sodium: 140 mmol/L (ref 135–145)
Total Bilirubin: 0.5 mg/dL (ref 0.0–1.2)
Total Protein: 7.6 g/dL (ref 6.5–8.1)

## 2023-09-05 LAB — RETICULOCYTES
Immature Retic Fract: 19.6 % — ABNORMAL HIGH (ref 2.3–15.9)
RBC.: 3.51 MIL/uL — ABNORMAL LOW (ref 4.22–5.81)
Retic Count, Absolute: 49.1 10*3/uL (ref 19.0–186.0)
Retic Ct Pct: 1.4 % (ref 0.4–3.1)

## 2023-09-05 LAB — LACTATE DEHYDROGENASE: LDH: 187 U/L (ref 98–192)

## 2023-09-05 LAB — FERRITIN: Ferritin: 63 ng/mL (ref 24–336)

## 2023-09-05 NOTE — Progress Notes (Signed)
 Hematology and Oncology Follow Up Visit  Thomas Hancock [id number] 12-16-1955 68 y.o. 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 twice daily on 03/28/2023 Phlebotomy as indicated to maintain ferritin < 100 and iron saturation < 50% Phlebotomy to keep hematocrit below 45% EC ASA 81 mg po q day   Interim History:  Thomas Hancock is here today for follow-up.  Thomas birth was yesterday.  He and Thomas wife are celebrating Ramadan right now.  He is doing okay.  He feels all right.  The big news is that Thomas Hancock and Thomas family have moved to [place].  This makes life a lot easier for him and Thomas wife.  He has had no problems with Thomas polycythemia.  We increased Thomas Hydrea dose back in October this is worked very well for him.  He has not needed a phlebotomy for quite a while.  Thomas iron studies for the hemochromatosis have been doing well.  When we last saw him in January, Thomas ferritin was 16 with iron saturation of 26%.  He has had no fever.  He has had no cough or shortness of breath.  He has been followed by Urology for the renal cell carcinoma.  He last saw them back in October.  Overall, I would say that Thomas performance status is probably ECOG 1.     Medications:  Allergies as of 09/05/2023   No Known Allergies      Medication List        Accurate as of September 05, 2023 11:41 AM. If you have any questions, ask your nurse or doctor.          acetaminophen 500 MG tablet Commonly known as: TYLENOL Take 500 mg by mouth every 6 (six) hours as needed.   ASPIRIN 81 PO Take 1 tablet by mouth daily.   atorvastatin 10 MG tablet Commonly known as: LIPITOR TAKE 1 TABLET DAILY   B-12 PO Take 1 tablet by mouth daily. Unknown  strength   hydroxyurea 500 MG capsule Commonly known as: HYDREA TAKE 1 CAPSULE BY MOUTH TWICE DAILY WITH MEALS TO  MINIMIZE  GI  SIDE  EFFECTS What changed:  how much to take how to take this when to take this   ibuprofen 200 MG tablet Commonly known as: ADVIL Take 400 mg by mouth 2 (two) times daily as needed.   MULTIVITAMIN ADULTS PO Take by mouth daily.   sertraline 50 MG tablet Commonly known as: ZOLOFT TAKE 1 TABLET DAILY        Allergies: No Known Allergies  Past Medical History, Surgical history, Social history, and Family History were reviewed and updated.  Review of Systems: Review of Systems  Constitutional: Negative.   HENT: Negative.    Eyes: Negative.   Respiratory: Negative.    Cardiovascular: Negative.   Gastrointestinal:  Positive for abdominal pain.  Genitourinary: Negative.   Musculoskeletal: Negative.   Skin: Negative.   Neurological: Negative.   Endo/Heme/Allergies: Negative.   Psychiatric/Behavioral: Negative.       Physical Exam:  height is 6' (1.829 m) and weight is 204 lb (92.5 kg). Thomas oral temperature is 99 F (37.2 C). Thomas blood pressure is 122/80 and Thomas pulse is 77. Thomas respiration is 20  and oxygen saturation is 99%.   Wt Readings from Last 3 Encounters:  09/05/23 204 lb (92.5 kg)  07/11/23 200 lb (90.7 kg)  05/09/23 198 lb (89.8 kg)    Physical Exam Vitals reviewed.  HENT:     Head: Normocephalic and atraumatic.  Eyes:     Pupils: Pupils are equal, round, and reactive to light.  Cardiovascular:     Rate and Rhythm: Normal rate and regular rhythm.     Heart sounds: Normal heart sounds.  Pulmonary:     Effort: Pulmonary effort is normal.     Breath sounds: Normal breath sounds.  Abdominal:     General: Bowel sounds are normal.     Palpations: Abdomen is soft.     Comments: Abdominal exam shows healing laparoscopy scars.  I think he has 5 scars.  They are well-healed.  There is no erythema.  There is no exudate.  Thomas  abdomen is slightly distended.  He has slightly decreased bowel sounds.  There is no guarding.  He has no palpable abdominal mass.  Is no palpable liver or spleen tip.  Musculoskeletal:        General: No tenderness or deformity. Normal range of motion.     Cervical back: Normal range of motion.  Lymphadenopathy:     Cervical: No cervical adenopathy.  Skin:    General: Skin is warm and dry.     Findings: No erythema or rash.  Neurological:     Mental Status: He is alert and oriented to person, place, and time.  Psychiatric:        Behavior: Behavior normal.        Thought Content: Thought content normal.        Judgment: Judgment normal.      Lab Results  Component Value Date   WBC 4.6 09/05/2023   HGB 13.7 09/05/2023   HCT 38.8 (L) 09/05/2023   MCV 109.0 (H) 09/05/2023   PLT 272 09/05/2023   Lab Results  Component Value Date   FERRITIN 16 (L) 07/11/2023   IRON 109 07/11/2023   TIBC 427 07/11/2023   UIBC 318 07/11/2023   IRONPCTSAT 26 07/11/2023   Lab Results  Component Value Date   RETICCTPCT 1.4 09/05/2023   RBC 3.56 (L) 09/05/2023   RBC 3.51 (L) 09/05/2023   No results found for: "KPAFRELGTCHN", "LAMBDASER", "KAPLAMBRATIO" No results found for: "IGGSERUM", "IGA", "IGMSERUM" No results found for: "TOTALPROTELP", "ALBUMINELP", "A1GS", "A2GS", "BETS", "BETA2SER", "GAMS", "MSPIKE", "SPEI"   Chemistry      Component Value Date/Time   NA 140 09/05/2023 1032   K 4.0 09/05/2023 1032   CL 104 09/05/2023 1032   CO2 28 09/05/2023 1032   BUN 19 09/05/2023 1032   CREATININE 1.05 09/05/2023 1032      Component Value Date/Time   CALCIUM 9.1 09/05/2023 1032   ALKPHOS 79 09/05/2023 1032   AST 37 09/05/2023 1032   ALT 51 (H) 09/05/2023 1032   BILITOT 0.5 09/05/2023 1032       Impression and Plan: Thomas Hancock is a very pleasant 68 yo [place] gentleman with Polycythemia vera -  JAK 2 positive; hemochromatosis - homozygous for the H63D mutation and right renal cell  carcinoma.  He does not need to be phlebotomized today.  I would imagine that Thomas iron studies are also okay.  I think we can now get him back in 3 months.  Given that everything is holding steady, we can try to move Thomas appointments out  a little bit longer.    [name], MD 3/12/202511:41 AM

## 2023-09-24 ENCOUNTER — Other Ambulatory Visit: Payer: Self-pay | Admitting: Hematology & Oncology

## 2023-12-05 ENCOUNTER — Inpatient Hospital Stay: Attending: Hematology & Oncology

## 2023-12-05 ENCOUNTER — Inpatient Hospital Stay (HOSPITAL_BASED_OUTPATIENT_CLINIC_OR_DEPARTMENT_OTHER): Admitting: Hematology & Oncology

## 2023-12-05 ENCOUNTER — Encounter: Payer: Self-pay | Admitting: Hematology & Oncology

## 2023-12-05 VITALS — BP 119/80 | HR 73 | Temp 98.9°F | Resp 18 | Ht 72.0 in | Wt 200.4 lb

## 2023-12-05 DIAGNOSIS — D45 Polycythemia vera: Secondary | ICD-10-CM | POA: Insufficient documentation

## 2023-12-05 DIAGNOSIS — C641 Malignant neoplasm of right kidney, except renal pelvis: Secondary | ICD-10-CM | POA: Diagnosis not present

## 2023-12-05 DIAGNOSIS — Z85528 Personal history of other malignant neoplasm of kidney: Secondary | ICD-10-CM | POA: Insufficient documentation

## 2023-12-05 DIAGNOSIS — Z79899 Other long term (current) drug therapy: Secondary | ICD-10-CM | POA: Diagnosis not present

## 2023-12-05 LAB — CMP (CANCER CENTER ONLY)
ALT: 18 U/L (ref 0–44)
AST: 22 U/L (ref 15–41)
Albumin: 4.5 g/dL (ref 3.5–5.0)
Alkaline Phosphatase: 66 U/L (ref 38–126)
Anion gap: 9 (ref 5–15)
BUN: 17 mg/dL (ref 8–23)
CO2: 25 mmol/L (ref 22–32)
Calcium: 9.3 mg/dL (ref 8.9–10.3)
Chloride: 104 mmol/L (ref 98–111)
Creatinine: 1.16 mg/dL (ref 0.61–1.24)
GFR, Estimated: >60 mL/min (ref >60)
Glucose, Bld: 125 mg/dL — ABNORMAL HIGH (ref 70–99)
Potassium: 3.8 mmol/L (ref 3.5–5.1)
Sodium: 138 mmol/L (ref 135–145)
Total Bilirubin: 0.9 mg/dL (ref 0.0–1.2)
Total Protein: 7.8 g/dL (ref 6.5–8.1)

## 2023-12-05 LAB — CBC WITH DIFFERENTIAL (CANCER CENTER ONLY)
Abs Immature Granulocytes: 0.01 10*3/uL (ref 0.00–0.07)
Basophils Absolute: 0.1 10*3/uL (ref 0.0–0.1)
Basophils Relative: 1 %
Eosinophils Absolute: 0.1 10*3/uL (ref 0.0–0.5)
Eosinophils Relative: 1 %
HCT: 38.5 % — ABNORMAL LOW (ref 39.0–52.0)
Hemoglobin: 13.7 g/dL (ref 13.0–17.0)
Immature Granulocytes: 0 %
Lymphocytes Relative: 24 %
Lymphs Abs: 1 10*3/uL (ref 0.7–4.0)
MCH: 39.4 pg — ABNORMAL HIGH (ref 26.0–34.0)
MCHC: 35.6 g/dL (ref 30.0–36.0)
MCV: 110.6 fL — ABNORMAL HIGH (ref 80.0–100.0)
Monocytes Absolute: 0.4 10*3/uL (ref 0.1–1.0)
Monocytes Relative: 11 %
Neutro Abs: 2.6 10*3/uL (ref 1.7–7.7)
Neutrophils Relative %: 63 %
Platelet Count: 215 10*3/uL (ref 150–400)
RBC: 3.48 MIL/uL — ABNORMAL LOW (ref 4.22–5.81)
RDW: 11.6 % (ref 11.5–15.5)
WBC Count: 4.2 10*3/uL (ref 4.0–10.5)
nRBC: 0 % (ref 0.0–0.2)

## 2023-12-05 LAB — LACTATE DEHYDROGENASE: LDH: 159 U/L (ref 98–192)

## 2023-12-05 LAB — FERRITIN: Ferritin: 39 ng/mL (ref 24–336)

## 2023-12-05 LAB — IRON AND IRON BINDING CAPACITY (CC-WL,HP ONLY)
Iron: 113 ug/dL (ref 45–182)
Saturation Ratios: 31 % (ref 17.9–39.5)
TIBC: 361 ug/dL (ref 250–450)
UIBC: 248 ug/dL (ref 117–376)

## 2023-12-05 NOTE — Progress Notes (Signed)
 Hematology and Oncology Follow Up Visit  Thomas Hancock [id number] [birth year] 68 y.o. 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 twice daily on 03/28/2023 Phlebotomy as indicated to maintain ferritin < 100 and iron saturation < 50% Phlebotomy to keep hematocrit below 45% EC ASA 81 mg po q day   Interim History:  Thomas Hancock is here today for follow-up.  We last saw him back in March.  Since then, he has been doing pretty well.  Thankfully, his son has moved up here to [place] .  It is a lot easier for he and his wife now to see their grandson.  He is doing okay.  He really has had no complaints.  Apparently, when he does exercise he gets little bit dizzy.  I told him that he may have to increase his fluid intake.  He has had no problems with this prior to the renal cell carcinoma.  He has now been 4 years since he had this resected.  He is followed by Urology for this.  He has had no problems with nausea or vomiting.  He has had no headache.  He does have hemochromatosis.  However, this really has not been an issue.  His last iron studies done back in March showed a ferritin of 63 with an iron saturation of 29%.  He has had no cough or shortness of breath.  He has had no change in bowel or bladder habits.  He has had no rashes.  There is been no leg swelling.  Overall, I would say his performance status is probably ECOG 1.   Medications:  Allergies as of 12/05/2023   No Known Allergies      Medication List        Accurate as of December 05, 2023 12:50 PM. If you have any questions, ask your nurse or doctor.          acetaminophen  500 MG tablet Commonly known as: TYLENOL  Take 500 mg by mouth every 6 (six) hours as  needed.   ASPIRIN 81 PO Take 1 tablet by mouth daily.   atorvastatin  10 MG tablet Commonly known as: LIPITOR TAKE 1 TABLET DAILY   B-12 PO Take 1 tablet by mouth daily. Unknown strength   hydroxyurea  500 MG capsule Commonly known as: HYDREA  TAKE 1 CAPSULE TWICE A DAY WITH MEALS TO MINIMIZE GASTROINTESTINAL SIDE EFFECTS   ibuprofen 200 MG tablet Commonly known as: ADVIL Take 400 mg by mouth 2 (two) times daily as needed.   MULTIVITAMIN ADULTS PO Take by mouth daily.   sertraline  50 MG tablet Commonly known as: ZOLOFT  TAKE 1 TABLET DAILY        Allergies: No Known Allergies  Past Medical History, Surgical history, Social history, and Family History were reviewed and updated.  Review of Systems: Review of Systems  Constitutional: Negative.   HENT: Negative.    Eyes: Negative.   Respiratory: Negative.    Cardiovascular: Negative.   Gastrointestinal:  Positive for abdominal pain.  Genitourinary: Negative.   Musculoskeletal: Negative.   Skin: Negative.   Neurological: Negative.   Endo/Heme/Allergies: Negative.   Psychiatric/Behavioral: Negative.       Physical Exam:  height is 6' (1.829 m) and weight is 200 lb 6.4  oz (90.9 kg). His oral temperature is 98.9 F (37.2 C). His blood pressure is 119/80 and his pulse is 73. His respiration is 18 and oxygen saturation is 98%.   Wt Readings from Last 3 Encounters:  12/05/23 200 lb 6.4 oz (90.9 kg)  09/05/23 204 lb (92.5 kg)  07/11/23 200 lb (90.7 kg)    Physical Exam Vitals reviewed.  HENT:     Head: Normocephalic and atraumatic.  Eyes:     Pupils: Pupils are equal, round, and reactive to light.  Cardiovascular:     Rate and Rhythm: Normal rate and regular rhythm.     Heart sounds: Normal heart sounds.  Pulmonary:     Effort: Pulmonary effort is normal.     Breath sounds: Normal breath sounds.  Abdominal:     General: Bowel sounds are normal.     Palpations: Abdomen is soft.     Comments: Abdominal exam  shows healing laparoscopy scars.  I think he has 5 scars.  They are well-healed.  There is no erythema.  There is no exudate.  His abdomen is slightly distended.  He has slightly decreased bowel sounds.  There is no guarding.  He has no palpable abdominal mass.  Is no palpable liver or spleen tip.  Musculoskeletal:        General: No tenderness or deformity. Normal range of motion.     Cervical back: Normal range of motion.  Lymphadenopathy:     Cervical: No cervical adenopathy.  Skin:    General: Skin is warm and dry.     Findings: No erythema or rash.  Neurological:     Mental Status: He is alert and oriented to person, place, and time.  Psychiatric:        Behavior: Behavior normal.        Thought Content: Thought content normal.        Judgment: Judgment normal.     Lab Results  Component Value Date   WBC 4.2 12/05/2023   HGB 13.7 12/05/2023   HCT 38.5 (L) 12/05/2023   MCV 110.6 (H) 12/05/2023   PLT 215 12/05/2023   Lab Results  Component Value Date   FERRITIN 63 09/05/2023   IRON 105 09/05/2023   TIBC 357 09/05/2023   UIBC 252 09/05/2023   IRONPCTSAT 29 09/05/2023   Lab Results  Component Value Date   RETICCTPCT 1.4 09/05/2023   RBC 3.48 (L) 12/05/2023   No results found for: KPAFRELGTCHN, LAMBDASER, KAPLAMBRATIO No results found for: IGGSERUM, IGA, IGMSERUM No results found for: Tanner Fanny, A1GS, Merton Abts, SPEI   Chemistry      Component Value Date/Time   NA 138 12/05/2023 1151   K 3.8 12/05/2023 1151   CL 104 12/05/2023 1151   CO2 25 12/05/2023 1151   BUN 17 12/05/2023 1151   CREATININE 1.16 12/05/2023 1151      Component Value Date/Time   CALCIUM  9.3 12/05/2023 1151   ALKPHOS 66 12/05/2023 1151   AST 22 12/05/2023 1151   ALT 18 12/05/2023 1151   BILITOT 0.9 12/05/2023 1151       Impression and Plan: Thomas Hancock is a very pleasant 68 yo [place] gentleman with Polycythemia vera  -  JAK 2 positive; 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  He does not need to be phlebotomized today.  I would imagine that his iron studies are also okay.  As such, I do not see a problem  with respect to his hemochromatosis.  Again, Urology is following his renal cell carcinoma.  I think he sees him sometime in the Fall.  Will plan to get him back in 3 months.  If all looks good in 3 months, then we might be able to get him through all of the Holiday season.    Ivor Mars, MD 6/11/202512:50 PM

## 2023-12-17 ENCOUNTER — Ambulatory Visit: Payer: Self-pay | Admitting: Family Medicine

## 2023-12-17 ENCOUNTER — Encounter: Payer: Self-pay | Admitting: Family Medicine

## 2023-12-17 ENCOUNTER — Ambulatory Visit (INDEPENDENT_AMBULATORY_CARE_PROVIDER_SITE_OTHER): Admitting: Family Medicine

## 2023-12-17 VITALS — BP 122/74 | HR 72 | Temp 97.9°F | Resp 16 | Ht 73.0 in | Wt 200.8 lb

## 2023-12-17 DIAGNOSIS — Z0001 Encounter for general adult medical examination with abnormal findings: Secondary | ICD-10-CM

## 2023-12-17 DIAGNOSIS — F411 Generalized anxiety disorder: Secondary | ICD-10-CM | POA: Diagnosis not present

## 2023-12-17 DIAGNOSIS — Z136 Encounter for screening for cardiovascular disorders: Secondary | ICD-10-CM

## 2023-12-17 DIAGNOSIS — Z125 Encounter for screening for malignant neoplasm of prostate: Secondary | ICD-10-CM | POA: Diagnosis not present

## 2023-12-17 DIAGNOSIS — Z23 Encounter for immunization: Secondary | ICD-10-CM | POA: Diagnosis not present

## 2023-12-17 DIAGNOSIS — L608 Other nail disorders: Secondary | ICD-10-CM

## 2023-12-17 DIAGNOSIS — Z0000 Encounter for general adult medical examination without abnormal findings: Secondary | ICD-10-CM

## 2023-12-17 DIAGNOSIS — E785 Hyperlipidemia, unspecified: Secondary | ICD-10-CM | POA: Insufficient documentation

## 2023-12-17 LAB — LIPID PANEL
Cholesterol: 144 mg/dL (ref 0–200)
HDL: 36.3 mg/dL — ABNORMAL LOW (ref >39.00)
LDL Cholesterol: 77 mg/dL (ref 0–99)
NonHDL: 107.69
Total CHOL/HDL Ratio: 4
Triglycerides: 154 mg/dL — ABNORMAL HIGH (ref 0.0–149.0)
VLDL: 30.8 mg/dL (ref 0.0–40.0)

## 2023-12-17 LAB — PSA, MEDICARE: PSA: 1.66 ng/mL (ref 0.10–4.00)

## 2023-12-17 MED ORDER — SERTRALINE HCL 50 MG PO TABS: 75.0000 mg | ORAL_TABLET | Freq: Every day | ORAL | 1 refills | Status: DC

## 2023-12-17 NOTE — Patient Instructions (Addendum)
 Give us  2-3 business days to get the results of your labs back.   Keep the diet clean and stay active.  Please get me a copy of your advanced directive form at your convenience.   Please reach out to the neurology team/Dr. Georjean: [phone]   If you do not hear anything about your referral in the next 1-2 weeks, call our office and ask for an update.  Please consider counseling. Contact [phone] to schedule an appointment or inquire about cost/insurance coverage.  [facility] located at [address], [address], [place], [place].  Phone number = [phone].  Dr. Verdel Silk - Adult Psychiatry.    [facility] located at [address] [place], [place], [place]. Phone number = [phone].   [facility] located at [address], [place], [place].  Phone number = [phone].   [facility] located at [address] [place], [place], [place].  Phone number = [phone].  Coping skills Choose 5 that work for you: Take a deep breath Count to 20 Read a book Do a puzzle Meditate Bake Sing Knit Garden Pray Go outside Call a friend Listen to music Take a walk Color Send a note Take a bath Watch a movie Be alone in a quiet place Pet an animal Visit a friend Journal Exercise Stretch   Let us  know if you need anything.

## 2023-12-17 NOTE — Progress Notes (Signed)
 Chief Complaint  Patient presents with   Annual Exam    CPE    Well Male Thomas Hancock is here for a complete physical.  He is here with his wife. His last physical was >1 year ago.  Current diet: in general, a healthy diet.   Current exercise: walking Weight trend: stable Fatigue out of ordinary? No. Seat belt? Yes.   Advanced directive? No  Health maintenance Shingrix- Yes Colonoscopy- Yes Tetanus- Yes Hep C- Yes Pneumonia vaccine- Due AAA screening- Due  GAD-patient has a history of generalized anxiety.  He takes Zoloft  50 mg daily.  Reports compliance and no adverse effects.  He does have episodes of anxiety, particularly when he is not doing anything.  He had an evaluation from a psychologist for memory issues.  It could not be completed because he had too much anxiety from it.  He is not following with a therapist.  No homicidal or suicidal ideation.  He does pick at his nails and sometimes bites them.  He does not have a nail on his left middle digit.  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   Renal cell cancer, right 10/16/2019   Right renal mass    Silent micro-hemorrhage of brain    right parietal   Syncope and collapse 12/16/2020   Vitamin B12 deficiency      Past Surgical History:  Procedure Laterality Date   COLONOSCOPY     COLONOSCOPY WITH ESOPHAGOGASTRODUODENOSCOPY (EGD)  07/2019   NECK SURGERY     ROBOTIC ASSITED PARTIAL NEPHRECTOMY Right 10/10/2019   Procedure: XI ROBOTIC ASSITED PARTIAL NEPHRECTOMY;  Surgeon: Devere Lonni Righter, MD;  Location: [facility];  Service: Urology;  Laterality: Right;    Medications   Current Outpatient Medications on File Prior to Visit  Medication Sig Dispense Refill   acetaminophen  (TYLENOL ) 500 MG tablet Take 500 mg by mouth every 6 (six) hours as needed.     ASPIRIN 81 PO Take 1 tablet by mouth daily.     atorvastatin  (LIPITOR) 10 MG tablet TAKE 1 TABLET DAILY 90 tablet 3   Cyanocobalamin  (B-12 PO) Take 1 tablet by mouth daily. Unknown strength     hydroxyurea  (HYDREA ) 500 MG capsule TAKE 1 CAPSULE TWICE A DAY WITH MEALS TO MINIMIZE GASTROINTESTINAL SIDE EFFECTS 180 capsule 3   ibuprofen (ADVIL) 200 MG tablet Take 400 mg by mouth 2 (two) times daily as needed.     Multiple Vitamins-Minerals (MULTIVITAMIN ADULTS PO) Take by mouth daily.      Allergies No Known Allergies  Family History Family History  Problem Relation Age of Onset   Memory loss Brother    Memory loss Paternal Uncle    Colon cancer Neg Hx    Esophageal cancer Neg Hx    Rectal cancer Neg Hx    Stomach cancer Neg Hx     Review of Systems: Constitutional:  no fevers Eye:  no recent significant change in vision Ears:  No changes in hearing Nose/Mouth/Throat:  no complaints of nasal congestion, no sore throat Cardiovascular: no chest pain Respiratory:  No shortness of breath Gastrointestinal:  No change in bowel habits GU:  No frequency Integumentary: +lack  of nail over L hand, middle finger Neurologic:  no headaches Endocrine:  denies unexplained weight changes  Exam BP 122/74 (BP Location: Left Arm, Patient Position: Sitting)   Pulse 72   Temp 97.9 F (36.6 C) (Oral)   Resp 16   Ht 6' 1 (1.854 m)   Wt 200 lb 12.8 oz (91.1 kg)   SpO2 (!) 7%   BMI 26.49 kg/m  General:  well developed, well nourished, in no apparent distress Skin: See below, otherwise no significant moles, warts, or growths Head:  no masses, lesions, or tenderness Eyes:  pupils equal and round, sclera anicteric without injection Ears:  canals without lesions, TMs shiny without retraction, no obvious effusion,  no erythema Nose:  nares patent, mucosa normal Throat/Pharynx:  lips and gingiva without lesion; tongue and uvula midline; non-inflamed pharynx; no exudates or postnasal drainage Lungs:  clear to auscultation, breath sounds equal bilaterally, no respiratory distress Cardio:  regular rate and rhythm, no LE edema or bruits Rectal: Deferred GI: BS+, S, NT, ND, no masses or organomegaly Musculoskeletal:  symmetrical muscle groups noted without atrophy or deformity Neuro:  gait normal; deep tendon reflexes normal and symmetric Psych: Normal affect   Left hand  Assessment and Plan  Well adult exam  Screening for prostate cancer - Plan: PSA, Medicare ( [place] only)  Dyslipidemia - Plan: Lipid panel  Screening for AAA (abdominal aortic aneurysm) - Plan: [place]  [facility] MEDICARE SCREENING  Generalized anxiety disorder - Plan: sertraline  (ZOLOFT ) 50 MG tablet  Nail deformity - Plan: Ambulatory referral to Dermatology  Encounter for Prevnar pneumococcal vaccination - Plan: Pneumococcal conjugate vaccine 20-valent (Prevnar 20)   Well 68 y.o. male. Counseled on diet and exercise. Counseled on risks and benefits of prostate cancer screening.  Agreed to undergo testing. AAA: Ultrasound ordered PCV 20 today. Nail deformity: I think this is related to anxiety and biting nails/picking at skin.  Will refer to dermatology for thoroughness. Anxiety: Chronic, not controlled.  Increase Zoloft  from 50 mg daily to 75 mg daily.  Counseling information provided.  Anxiety coping techniques provided in AVS.  Follow-up in 6 weeks to recheck this. Advanced directive form provided today.  Other orders as above. The patient and his spouse voiced understanding and agreement to the plan.  Mabel [place], DO 12/17/23 10:31 AM

## 2024-01-04 ENCOUNTER — Telehealth (HOSPITAL_BASED_OUTPATIENT_CLINIC_OR_DEPARTMENT_OTHER): Payer: Self-pay

## 2024-02-27 ENCOUNTER — Telehealth (HOSPITAL_BASED_OUTPATIENT_CLINIC_OR_DEPARTMENT_OTHER): Payer: Self-pay

## 2024-03-28 ENCOUNTER — Ambulatory Visit (INDEPENDENT_AMBULATORY_CARE_PROVIDER_SITE_OTHER): Admitting: Family Medicine

## 2024-03-28 ENCOUNTER — Encounter: Payer: Self-pay | Admitting: Family Medicine

## 2024-03-28 VITALS — BP 118/76 | HR 86 | Temp 97.6°F | Resp 16 | Ht 73.0 in | Wt 199.0 lb

## 2024-03-28 DIAGNOSIS — S91309A Unspecified open wound, unspecified foot, initial encounter: Secondary | ICD-10-CM

## 2024-03-28 DIAGNOSIS — R7303 Prediabetes: Secondary | ICD-10-CM | POA: Diagnosis not present

## 2024-03-28 MED ORDER — MUPIROCIN 2 % EX OINT: 1.0000 | TOPICAL_OINTMENT | Freq: Two times a day (BID) | CUTANEOUS | 0 refills | Status: AC

## 2024-03-28 NOTE — Patient Instructions (Signed)
 Give us  2-3 business days to get the results of your labs back.   When you do wash it, use only soap and water . Do not vigorously scrub. Apply the antibiotic ointment twice daily. Keep the area clean and dry.   Things to look out for: increasing pain not relieved by acetaminophen , fevers, spreading redness, drainage of pus, or foul odor.  Let us  know if you need anything.

## 2024-03-28 NOTE — Progress Notes (Signed)
 Chief Complaint  Patient presents with   Wound Check    Wound Check    Thomas Hancock is a 68 y.o. male here for a skin complaint. Here w daughter who helps w hx.   Duration: 1 week Location: bottom of L heel Pruritic? No Painful? Yes Drainage? No New soaps/lotions/topicals/detergents? No Trauma? 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 +TTP without excessive warmth. No drainage, erythema, fluctuance, excoriation Psych: Age appropriate judgment and insight  Wound of foot - Plan: mupirocin ointment (BACTROBAN) 2 %  Prediabetes - Plan: HgB A1c  There is no evidence of infection.  Twice daily mupirocin/TAO for 10 to 14 days.  Keep the area clean and dry. Warning signs and symptoms verbalized and written down in AVS.  Check an A1c to follow-up on prediabetes. F/u prn/as originally scheduled. The patient and his daughter voiced understanding and agreement to the plan.  Mabel [place], DO 03/28/24 3:45 PM

## 2024-03-29 ENCOUNTER — Ambulatory Visit: Payer: Self-pay | Admitting: Family Medicine

## 2024-03-29 LAB — HEMOGLOBIN A1C
Hgb A1c MFr Bld: 5.7 % — ABNORMAL HIGH (ref <5.7)
Mean Plasma Glucose: 117 mg/dL
eAG (mmol/L): 6.5 mmol/L

## 2024-04-04 DIAGNOSIS — L03116 Cellulitis of left lower limb: Secondary | ICD-10-CM | POA: Diagnosis not present

## 2024-04-07 ENCOUNTER — Ambulatory Visit: Admitting: Family Medicine

## 2024-04-09 ENCOUNTER — Encounter: Payer: Self-pay | Admitting: Hematology & Oncology

## 2024-04-09 ENCOUNTER — Inpatient Hospital Stay: Attending: Hematology & Oncology

## 2024-04-09 ENCOUNTER — Inpatient Hospital Stay: Admitting: Hematology & Oncology

## 2024-04-09 VITALS — BP 104/80 | HR 93 | Temp 97.4°F | Resp 18 | Wt 197.1 lb

## 2024-04-09 DIAGNOSIS — D45 Polycythemia vera: Secondary | ICD-10-CM | POA: Insufficient documentation

## 2024-04-09 DIAGNOSIS — Z7982 Long term (current) use of aspirin: Secondary | ICD-10-CM | POA: Insufficient documentation

## 2024-04-09 DIAGNOSIS — Z85528 Personal history of other malignant neoplasm of kidney: Secondary | ICD-10-CM | POA: Diagnosis not present

## 2024-04-09 DIAGNOSIS — C641 Malignant neoplasm of right kidney, except renal pelvis: Secondary | ICD-10-CM | POA: Diagnosis not present

## 2024-04-09 LAB — CBC WITH DIFFERENTIAL (CANCER CENTER ONLY)
Abs Immature Granulocytes: 0.06 K/uL (ref 0.00–0.07)
Basophils Absolute: 0.1 K/uL (ref 0.0–0.1)
Basophils Relative: 1 %
Eosinophils Absolute: 0.1 K/uL (ref 0.0–0.5)
Eosinophils Relative: 1 %
HCT: 37.7 % — ABNORMAL LOW (ref 39.0–52.0)
Hemoglobin: 13.5 g/dL (ref 13.0–17.0)
Immature Granulocytes: 1 %
Lymphocytes Relative: 9 %
Lymphs Abs: 0.6 K/uL — ABNORMAL LOW (ref 0.7–4.0)
MCH: 39.6 pg — ABNORMAL HIGH (ref 26.0–34.0)
MCHC: 35.8 g/dL (ref 30.0–36.0)
MCV: 110.6 fL — ABNORMAL HIGH (ref 80.0–100.0)
Monocytes Absolute: 0.6 K/uL (ref 0.1–1.0)
Monocytes Relative: 9 %
Neutro Abs: 5.5 K/uL (ref 1.7–7.7)
Neutrophils Relative %: 79 %
Platelet Count: 224 K/uL (ref 150–400)
RBC: 3.41 MIL/uL — ABNORMAL LOW (ref 4.22–5.81)
RDW: 12.2 % (ref 11.5–15.5)
WBC Count: 6.8 K/uL (ref 4.0–10.5)
nRBC: 0 % (ref 0.0–0.2)

## 2024-04-09 LAB — CMP (CANCER CENTER ONLY)
ALT: 33 U/L (ref 0–44)
AST: 41 U/L (ref 15–41)
Albumin: 4.6 g/dL (ref 3.5–5.0)
Alkaline Phosphatase: 88 U/L (ref 38–126)
Anion gap: 15 (ref 5–15)
BUN: 18 mg/dL (ref 8–23)
CO2: 20 mmol/L — ABNORMAL LOW (ref 22–32)
Calcium: 9.3 mg/dL (ref 8.9–10.3)
Chloride: 102 mmol/L (ref 98–111)
Creatinine: 1.2 mg/dL (ref 0.61–1.24)
GFR, Estimated: >60 mL/min (ref >60)
Glucose, Bld: 170 mg/dL — ABNORMAL HIGH (ref 70–99)
Potassium: 4.4 mmol/L (ref 3.5–5.1)
Sodium: 137 mmol/L (ref 135–145)
Total Bilirubin: 0.7 mg/dL (ref 0.0–1.2)
Total Protein: 8.1 g/dL (ref 6.5–8.1)

## 2024-04-09 LAB — RETICULOCYTES
Immature Retic Fract: 15 % (ref 2.3–15.9)
RBC.: 3.41 MIL/uL — ABNORMAL LOW (ref 4.22–5.81)
Retic Count, Absolute: 51.2 K/uL (ref 19.0–186.0)
Retic Ct Pct: 1.5 % (ref 0.4–3.1)

## 2024-04-09 LAB — IRON AND IRON BINDING CAPACITY (CC-WL,HP ONLY)
Iron: 83 ug/dL (ref 45–182)
Saturation Ratios: 22 % (ref 17.9–39.5)
TIBC: 377 ug/dL (ref 250–450)
UIBC: 294 ug/dL

## 2024-04-09 LAB — FERRITIN: Ferritin: 63 ng/mL (ref 24–336)

## 2024-04-09 LAB — LACTATE DEHYDROGENASE: LDH: 192 U/L (ref 98–192)

## 2024-04-09 LAB — SAVE SMEAR(SSMR), FOR PROVIDER SLIDE REVIEW

## 2024-04-09 NOTE — Progress Notes (Signed)
 Hematology and Oncology Follow Up Visit  Thomas Hancock [id number] [birth year] 68 y.o. 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 twice daily on 03/28/2023 Phlebotomy as indicated to maintain ferritin < 100 and iron saturation < 50% Phlebotomy to keep hematocrit below 45% EC ASA 81 mg po q day   Interim History:  Thomas Hancock is here today for follow-up.  The main problem that we have right now is the fact that he has his necrotic appearing wound on the back of his right foot.  He says this is going on for a couple weeks.  He thinks he may have gotten bit by a spider.  When I look at this area, there is a necrotic area.  It is open.  Around it, he has a lot of erythema.  It is very tender.  Looks like he has some necrotic skin.  Will see about getting him to a Podiatrist.  I think this would certainly be reasonable.  To me, it looks like this may have to be opened up and may be debrided.  Outside of that, he is doing okay.  He has had no problems with respect to the polycythemia.  He has not had any phlebotomy for a while.  He also has hemochromatosis.  His iron studies have always been fine.  I know that he has been followed by Urology for his stage I renal cell carcinoma.  Currently, I would have to say that his performance status is probably Thomas Hancock.    Medications:  Allergies as of 04/09/2024   No Known Allergies      Medication List        Accurate as of April 09, 2024  Hancock:00 PM. If you have any questions, ask your nurse or doctor.          acetaminophen  500 MG tablet Commonly known as: TYLENOL  Take 500 mg by mouth every 6 (six) hours as needed.   ASPIRIN 81 PO Take Hancock tablet by mouth daily.   atorvastatin  10 MG  tablet Commonly known as: LIPITOR TAKE Hancock TABLET DAILY   B-12 PO Take Hancock tablet by mouth daily. Unknown strength   hydroxyurea  500 MG capsule Commonly known as: HYDREA  TAKE Hancock CAPSULE TWICE A DAY WITH MEALS TO MINIMIZE GASTROINTESTINAL SIDE EFFECTS   ibuprofen 200 MG tablet Commonly known as: ADVIL Take 400 mg by mouth 2 (two) times daily as needed.   MULTIVITAMIN ADULTS PO Take by mouth daily.   mupirocin ointment 2 % Commonly known as: BACTROBAN Apply Hancock Application topically 2 (two) times daily.   sertraline  50 MG tablet Commonly known as: ZOLOFT  Take Hancock.5 tablets (75 mg total) by mouth daily.   sulfamethoxazole-trimethoprim 800-160 MG tablet Commonly known as: BACTRIM DS Take Hancock tablet by mouth 2 (two) times daily.        Allergies: No Known Allergies  Past Medical History, Surgical history, Social history, and Family History were reviewed and updated.  Review of Systems: Review of Systems  Constitutional: Negative.   HENT: Negative.    Eyes: Negative.   Respiratory: Negative.    Cardiovascular: Negative.   Gastrointestinal:  Positive for abdominal pain.  Genitourinary: Negative.   Musculoskeletal: Negative.   Skin: Negative.   Neurological:  Negative.   Endo/Heme/Allergies: Negative.   Psychiatric/Behavioral: Negative.       Physical Exam:  weight is 197 lb Hancock.9 oz (89.4 kg). His oral temperature is 97.4 F (36.3 C) (abnormal). His blood pressure is 104/80 and his pulse is 93. His respiration is 18 and oxygen saturation is 100%.   Wt Readings from Last 3 Encounters:  04/09/24 197 lb Hancock.9 oz (89.4 kg)  03/28/24 199 lb (90.3 kg)  12/17/23 200 lb 12.8 oz (91.Hancock kg)    Physical Exam Vitals reviewed.  HENT:     Head: Normocephalic and atraumatic.  Eyes:     Pupils: Pupils are equal, round, and reactive to light.  Cardiovascular:     Rate and Rhythm: Normal rate and regular rhythm.     Heart sounds: Normal heart sounds.  Pulmonary:     Effort: Pulmonary  effort is normal.     Breath sounds: Normal breath sounds.  Abdominal:     General: Bowel sounds are normal.     Palpations: Abdomen is soft.     Comments: Abdominal exam shows healing laparoscopy scars.  I think he has 5 scars.  They are well-healed.  There is no erythema.  There is no exudate.  His abdomen is slightly distended.  He has slightly decreased bowel sounds.  There is no guarding.  He has no palpable abdominal mass.  Is no palpable liver or spleen tip.  Musculoskeletal:        General: No tenderness or deformity. Normal range of motion.     Cervical back: Normal range of motion.     Comments: In his right heel, he has an open wound.  There appears to be a lot of erythema about this.  He has some areas that almost look like they are necrotic.  It is quite tender to palpation.  Lymphadenopathy:     Cervical: No cervical adenopathy.  Skin:    General: Skin is warm and dry.     Findings: No erythema or rash.  Neurological:     Mental Status: He is alert and oriented to person, place, and time.  Psychiatric:        Behavior: Behavior normal.        Thought Content: Thought content normal.        Judgment: Judgment normal.      Lab Results  Component Value Date   WBC 6.8 04/09/2024   HGB 13.5 04/09/2024   HCT 37.7 (L) 04/09/2024   MCV 110.6 (H) 04/09/2024   PLT 224 04/09/2024   Lab Results  Component Value Date   FERRITIN 39 12/05/2023   IRON 113 12/05/2023   TIBC 361 12/05/2023   UIBC 248 12/05/2023   IRONPCTSAT 31 12/05/2023   Lab Results  Component Value Date   RETICCTPCT Hancock.5 04/09/2024   RBC 3.41 (L) 04/09/2024   RBC 3.41 (L) 04/09/2024   No results found for: KPAFRELGTCHN, LAMBDASER, KAPLAMBRATIO No results found for: IGGSERUM, IGA, IGMSERUM No results found for: STEPHANY CARLOTA BENSON MARKEL EARLA JOANNIE DOC VICK, SPEI   Chemistry      Component Value Date/Time   NA 137 04/09/2024 1147   K 4.4 04/09/2024  1147   CL 102 04/09/2024 1147   CO2 20 (L) 04/09/2024 1147   BUN 18 04/09/2024 1147   CREATININE Hancock.20 04/09/2024 1147      Component Value Date/Time   CALCIUM  9.3 04/09/2024 1147   ALKPHOS 88 04/09/2024 1147   AST 41 04/09/2024 1147  ALT 33 04/09/2024 1147   BILITOT 0.7 04/09/2024 1147       Impression and Plan: Thomas Hancock is a very pleasant 68 yo [place] gentleman with Polycythemia vera -  JAK 2 positive; 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  He does not need to be phlebotomized today.  I would imagine that his iron studies are also okay.  As such, I do not see a problem with respect to his hemochromatosis.  Again, Urology is following his renal cell carcinoma.  I think he sees him sometime in the Fall.  We will see about making an appointment for him to see Podiatry for this heel wound.  Again, I think this is his biggest problem by far.  Hopefully, this will not need to be debrided.  Somehow, it would not surprise me if it did.  For our point of view, we will plan to get him back after the Holiday season.  I think this would be reasonable.  Hopefully, we can move his appointments out a little bit longer.     Maude JONELLE Crease, MD 10/15/20251:00 PM

## 2024-04-10 ENCOUNTER — Telehealth: Payer: Self-pay | Admitting: *Deleted

## 2024-04-10 ENCOUNTER — Encounter: Payer: Self-pay | Admitting: Podiatry

## 2024-04-10 ENCOUNTER — Ambulatory Visit (INDEPENDENT_AMBULATORY_CARE_PROVIDER_SITE_OTHER): Admitting: Podiatry

## 2024-04-10 ENCOUNTER — Other Ambulatory Visit: Payer: Self-pay | Admitting: Podiatry

## 2024-04-10 ENCOUNTER — Ambulatory Visit: Payer: Self-pay | Admitting: Hematology & Oncology

## 2024-04-10 ENCOUNTER — Ambulatory Visit

## 2024-04-10 DIAGNOSIS — L0390 Cellulitis, unspecified: Secondary | ICD-10-CM

## 2024-04-10 DIAGNOSIS — L97429 Non-pressure chronic ulcer of left heel and midfoot with unspecified severity: Secondary | ICD-10-CM | POA: Diagnosis not present

## 2024-04-10 DIAGNOSIS — S91302A Unspecified open wound, left foot, initial encounter: Secondary | ICD-10-CM | POA: Diagnosis not present

## 2024-04-10 DIAGNOSIS — M7989 Other specified soft tissue disorders: Secondary | ICD-10-CM | POA: Diagnosis not present

## 2024-04-10 DIAGNOSIS — M7732 Calcaneal spur, left foot: Secondary | ICD-10-CM | POA: Diagnosis not present

## 2024-04-10 NOTE — Telephone Encounter (Signed)
 I left the patient a message asking him to come in a few minutes early prior to his appointment with us  because we need xrays of his foot.

## 2024-04-10 NOTE — Progress Notes (Signed)
  Subjective:  Patient ID: Thomas Hancock, male    DOB: 25-May-1956,   MRN: [id number]  Chief Complaint  Patient presents with   Foot Pain    He has a wound on his left heel.    68 y.o. male presents for concern of wound on his left heel that has been present for about three weeks and worsened in last two weeks. Relates very painful. He has been on bactrim and applying mupirocin to the spot. He denies diabetes. Currently on chemotherapy . They believe it possibly started as a spider bite.  Denies any other pedal complaints. Denies n/v/f/c.   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 CFT <3 seconds. Normal hair growth on digits. No edema.  Skin. No lacerations or abrasions bilateral feet. Left heel ulceration posterior heel with necrosis overlying and tenderness to palpation. No erythema edema or purulence noted. No probe to bone  Musculoskeletal: MMT 5/5 bilateral lower extremities in DF, PF, Inversion and Eversion. Deceased ROM in DF of ankle joint.  Neurological: Sensation intact to light touch.   Assessment:  No diagnosis found.   Plan:  Patient was evaluated and treated and all questions answered. Ulcer posterior left heel with necrosis of skin unstageable  Minimal debridement preformed.  -x-rays reviewed No osseous erosions noted.  -Dressed with betadine, DSD. -Off-loading with heel cushion pads advised  on where to purchase  -Continue bactrim and mupirocin.  -ABIS ordered to evaluated blood flow.  -Discussed glucose control and proper protein-rich diet.  -Discussed if any worsening redness, pain, fever or chills to call or may need to report to the emergency room. Patient expressed understanding.   Return in 2 weeks for recheck.   Asberry Failing, DPM

## 2024-04-11 ENCOUNTER — Other Ambulatory Visit: Payer: Self-pay | Admitting: Podiatry

## 2024-04-11 MED ORDER — TRAMADOL HCL 50 MG PO TABS: 50.0000 mg | ORAL_TABLET | Freq: Three times a day (TID) | ORAL | 0 refills | Status: AC | PRN

## 2024-04-21 ENCOUNTER — Encounter: Payer: Self-pay | Admitting: Podiatry

## 2024-04-22 ENCOUNTER — Encounter: Payer: Self-pay | Admitting: Podiatry

## 2024-04-22 MED ORDER — SULFAMETHOXAZOLE-TRIMETHOPRIM 800-160 MG PO TABS: 1.0000 | ORAL_TABLET | Freq: Two times a day (BID) | ORAL | 0 refills | Status: DC

## 2024-04-24 ENCOUNTER — Encounter: Payer: Self-pay | Admitting: Podiatry

## 2024-04-24 ENCOUNTER — Ambulatory Visit (INDEPENDENT_AMBULATORY_CARE_PROVIDER_SITE_OTHER): Admitting: Podiatry

## 2024-04-24 DIAGNOSIS — L97429 Non-pressure chronic ulcer of left heel and midfoot with unspecified severity: Secondary | ICD-10-CM

## 2024-04-24 MED ORDER — OXYCODONE-ACETAMINOPHEN 5-325 MG PO TABS: 1.0000 | ORAL_TABLET | ORAL | 0 refills | Status: AC | PRN

## 2024-04-24 NOTE — Progress Notes (Signed)
  Subjective:  Patient ID: Thomas Hancock, male    DOB: 18-Oct-1955,   MRN: [id number]  Chief Complaint  Patient presents with   Wound Check    L heel Ulcer  partially closed some drainage. Had redness started back on Bactrim 2 days ago.  A1c 5.7. 81 mg Asprin.     68 y.o. male presents for follow-up of left heel wound.  Relates very painful. He has been on bactrim and applying mupirocin to the spot. He denies diabetes. Currently on chemotherapy . They believe it possibly started as a spider bite.  Awaiting ABIs scheduled for 11/3 denies any other pedal complaints. Denies n/v/f/c.   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 CFT <3 seconds. Normal hair growth on digits. No edema.  Skin. No lacerations or abrasions bilateral feet. Left heel ulceration posterior heel with necrosis overlying and tenderness to palpation. No erythema edema or purulence noted.  Gross no probe to bone  Musculoskeletal: MMT 5/5 bilateral lower extremities in DF, PF, Inversion and Eversion. Deceased ROM in DF of ankle joint.  Neurological: Sensation intact to light touch.   Assessment:   1. Ulcer of left heel and midfoot, unspecified ulcer stage (HCC)      Plan:  Patient was evaluated and treated and all questions answered. Ulcer posterior left heel with necrosis of skin unstageable  Minimal debridement preformed.  -x-rays reviewed No  osseous erosions noted.  -Dressed with betadine, DSD. -Off-loading with heel cushion pads advised on where to purchase  -Continue bactrim and will switch to Betadine dressing changes daily. - Awaiting ABI -Discussed glucose control and proper protein-rich diet.  -Oxycodone  sent for pain.  -Discussed if any worsening redness, pain, fever or chills to call or may need to report to the emergency room. Patient expressed understanding.   Return in 1 week for recheck  Asberry Failing, DPM

## 2024-04-28 ENCOUNTER — Ambulatory Visit (HOSPITAL_COMMUNITY)
Admission: RE | Admit: 2024-04-28 | Discharge: 2024-04-28 | Disposition: A | Discharge status: Home / Self Care | Source: Ambulatory Visit | Attending: Podiatry | Admitting: Podiatry

## 2024-04-28 DIAGNOSIS — L97429 Non-pressure chronic ulcer of left heel and midfoot with unspecified severity: Secondary | ICD-10-CM | POA: Insufficient documentation

## 2024-04-28 LAB — VAS US ABI WITH/WO TBI
Left ABI: 1.14
Right ABI: 1.14

## 2024-04-29 ENCOUNTER — Telehealth: Payer: Self-pay | Admitting: Family Medicine

## 2024-04-29 NOTE — Telephone Encounter (Signed)
 Left message for asking if pt can move the AWV scheduled for 05/02/24 at 3 to 1:40. Asked that pt call us  back to let us  know if we can move appt.

## 2024-05-02 ENCOUNTER — Ambulatory Visit (INDEPENDENT_AMBULATORY_CARE_PROVIDER_SITE_OTHER): Admitting: Podiatry

## 2024-05-02 ENCOUNTER — Ambulatory Visit: Admitting: *Deleted

## 2024-05-02 ENCOUNTER — Encounter: Payer: Self-pay | Admitting: Podiatry

## 2024-05-02 ENCOUNTER — Telehealth: Payer: Self-pay | Admitting: *Deleted

## 2024-05-02 VITALS — BP 110/75 | HR 87 | Temp 97.9°F | Resp 16 | Ht 73.0 in

## 2024-05-02 VITALS — BP 150/91 | HR 74 | Temp 97.2°F

## 2024-05-02 DIAGNOSIS — L97429 Non-pressure chronic ulcer of left heel and midfoot with unspecified severity: Secondary | ICD-10-CM | POA: Diagnosis not present

## 2024-05-02 DIAGNOSIS — Z23 Encounter for immunization: Secondary | ICD-10-CM

## 2024-05-02 DIAGNOSIS — Z0000 Encounter for general adult medical examination without abnormal findings: Secondary | ICD-10-CM | POA: Diagnosis not present

## 2024-05-02 MED ORDER — SULFAMETHOXAZOLE-TRIMETHOPRIM 800-160 MG PO TABS: 1.0000 | ORAL_TABLET | Freq: Two times a day (BID) | ORAL | 0 refills | Status: AC

## 2024-05-02 MED ORDER — OXYCODONE-ACETAMINOPHEN 5-325 MG PO TABS: 1.0000 | ORAL_TABLET | ORAL | 0 refills | Status: AC | PRN

## 2024-05-02 NOTE — Progress Notes (Signed)
 Please attest this visit in the absence of patient primary care provider.    Subjective:   Thomas Hancock is a 68 y.o. male who presents for a Medicare Annual Wellness Visit.  Allergies (verified) Patient has no known allergies.   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   Renal cell cancer, right 10/16/2019   Right renal mass    Silent micro-hemorrhage of brain    right parietal   Syncope and collapse 12/16/2020   Vitamin B12 deficiency    Past Surgical History:  Procedure Laterality Date   COLONOSCOPY     COLONOSCOPY WITH ESOPHAGOGASTRODUODENOSCOPY (EGD)  07/2019   NECK SURGERY     ROBOTIC ASSITED PARTIAL NEPHRECTOMY Right 10/10/2019   Procedure: XI ROBOTIC ASSITED PARTIAL NEPHRECTOMY;  Surgeon: Devere Lonni Righter, MD;  Location: [facility];  Service: Urology;  Laterality: Right;   Family History  Problem Relation Age of Onset   Memory loss Brother    Memory loss Paternal Uncle    Colon cancer Neg Hx    Esophageal cancer Neg Hx    Rectal cancer Neg Hx    Stomach cancer Neg Hx    Social History   Occupational History   Occupation: Airline Pilot  Tobacco Use   Smoking status: Former    Types: Cigarettes    Start date: 1995    Quit date: 1972    Years since quitting: 53.8   Smokeless tobacco: Never   Tobacco comments:    quit over 20 years ago  Vaping Use   Vaping status: Never Used  Substance and Sexual Activity   Alcohol use: Never   Drug use: Never   Sexual activity: Not on file   Tobacco Counseling Counseling given: Not Answered Tobacco comments: quit over 20 years ago  SDOH Screenings   Food Insecurity: No  Food Insecurity (05/02/2024)  Housing: Low Risk  (05/02/2024)  Transportation Needs: No Transportation Needs (12/16/2023)  Utilities: Not At Risk (05/02/2024)  Depression (PHQ2-9): High Risk (05/02/2024)  Financial Resource Strain: Low Risk  (12/16/2023)  Physical Activity: Inactive (05/02/2024)  Social Connections: Moderately Isolated (05/02/2024)  Stress: No Stress Concern Present (05/02/2024)  Tobacco Use: Medium Risk (05/02/2024)   Depression Screen    05/02/2024    3:22 PM 03/28/2024    3:29 PM 04/11/2022    1:01 PM 10/29/2020    8:38 AM  PHQ 2/9 Scores  PHQ - 2 Score 4 6 0 1  PHQ- 9 Score 11 16  0  1      Data saved with a previous flowsheet row definition      Goals Addressed   None    Visit info / Clinical Intake: Medicare Wellness Visit Type:: Initial Annual Wellness Visit Interpreter Needed?: Yes Interpreter Name: Gunnar (daughter) Patient Declined Interpreter : No Patient signed [place] waiver: No Pre-visit prep was completed: yes AWV questionnaire completed by patient prior to visit?: no Living arrangements:: with family/others Patient's Overall Health Status Rating: good Typical amount of pain: some Does pain affect daily life?: (!) yes (recently increased due to sore on foot) Are you currently prescribed opioids?: (!) yes  Dietary Habits and Nutritional Risks How many meals  a day?: 3 Eats fruit and vegetables daily?: yes Most meals are obtained by: preparing own meals Diabetic:: no  Functional Status Activities of Daily Living (to include ambulation/medication): Independent Feeding: Independent Dressing/Grooming: Independent Bathing: Independent Toileting: Independent Transfer: Independent Ambulation: Independent Medication Administration: Independent Home Management: Independent Manage your own finances?: (!) no (spouse assists) Primary transportation is: family/friends Concerns about vision?: no *vision screening is required for [facility]* (not  established, list provided for [employer]) Concerns about hearing?: no  Fall Screening Falls in the past year?: 0 Number of falls in past year: 0 Was there an injury with Fall?: 0 Fall Risk Category Calculator: 0 Patient Fall Risk Level: Low Fall Risk  Fall Risk Patient at Risk for Falls Due to: Impaired balance/gait Fall risk Follow up: Falls evaluation completed  Home and Transportation Safety: All rugs have non-skid backing?: yes All stairs or steps have railings?: yes Grab bars in the bathtub or shower?: (!) no Have non-skid surface in bathtub or shower?: (!) no Good home lighting?: yes Regular seat belt use?: yes Hospital stays in the last year:: no  Cognitive Assessment Difficulty concentrating, remembering, or making decisions? : no Will 6CIT or Mini Cog be Completed: yes What year is it?: 4 points What month is it?: 3 points Give patient an address phrase to remember (5 components): [address], [employer] [place]  About what time is it?: 3 points Count backwards from 20 to 1: 4 points Say the months of the year in reverse: 4 points Repeat the address phrase from earlier: 10 points 6 CIT Score: 28 points  Advance Directives (For Healthcare) Does Patient Have a Medical Advance Directive?: No Would patient like information on creating a medical advance directive?: Yes (MAU/Ambulatory/Procedural Areas - Information given)  Reviewed/Updated  Reviewed/Updated: All        Objective:    Today's Vitals   05/02/24 1501  BP: 110/75  Pulse: 87  Resp: 16  Temp: 97.9 F (36.6 C)  TempSrc: Oral  SpO2: 100%  Height: 6' 1 (1.854 m)   Body mass index is 26.01 kg/m.  Current Medications (verified) Outpatient Encounter Medications as of 05/02/2024  Medication Sig   acetaminophen  (TYLENOL ) 500 MG tablet Take 500 mg by mouth every 6 (six) hours as needed.   ASPIRIN 81 PO Take 1 tablet by mouth daily.   atorvastatin  (LIPITOR) 10 MG tablet TAKE 1 TABLET DAILY    Cyanocobalamin  (B-12 PO) Take 1 tablet by mouth daily. Unknown strength   hydroxyurea  (HYDREA ) 500 MG capsule TAKE 1 CAPSULE TWICE A DAY WITH MEALS TO MINIMIZE GASTROINTESTINAL SIDE EFFECTS   ibuprofen (ADVIL) 200 MG tablet Take 400 mg by mouth 2 (two) times daily as needed.   Multiple Vitamins-Minerals (MULTIVITAMIN ADULTS PO) Take by mouth daily.   mupirocin ointment (BACTROBAN) 2 % Apply 1 Application topically 2 (two) times daily.   oxyCODONE -acetaminophen  (PERCOCET/ROXICET) 5-325 MG tablet Take 1 tablet by mouth every 4 (four) hours as needed for up to 5 days for severe pain (pain score 7-10).   sertraline  (ZOLOFT ) 50 MG tablet Take 1.5 tablets (75 mg total) by mouth daily.   sulfamethoxazole-trimethoprim (BACTRIM DS) 800-160 MG tablet Take 1 tablet by mouth 2 (two) times daily for 7 days.   No facility-administered encounter medications on file as of 05/02/2024.   Hearing/Vision screen No results found. Immunizations and Health Maintenance Health Maintenance  Topic Date Due   COVID-19 Vaccine (4 - 2025-26 season) 02/25/2024   Medicare Annual Wellness (AWV)  05/02/2025  Colonoscopy  11/30/2029   DTaP/Tdap/Td (2 - Td or Tdap) 10/30/2030   Pneumococcal Vaccine: 50+ Years  Completed   Influenza Vaccine  Completed   Hepatitis C Screening  Completed   Zoster Vaccines- Shingrix  Completed   Meningococcal B Vaccine  Aged Out        Assessment/Plan:  This is a routine wellness examination for Rykar.  Patient Care Team: Frann Mabel Mt, DO as PCP - General (Family Medicine) Joya Stabs, DPM as Consulting Physician (Podiatry) Timmy, Maude SAUNDERS, MD as Consulting Physician (Oncology) Charlanne Groom, MD as Consulting Physician (Gastroenterology)  I have personally reviewed and noted the following in the patient's chart:   Medical and social history Use of alcohol, tobacco or illicit drugs  Current medications and supplements including opioid prescriptions. Functional  ability and status Nutritional status Physical activity Advanced directives List of other physicians Hospitalizations, surgeries, and ER visits in previous 12 months Vitals Screenings to include cognitive, depression, and falls Referrals and appointments  Orders Placed This Encounter  Procedures   Flu vaccine HIGH DOSE PF(Fluzone Trivalent)   In addition, I have reviewed and discussed with patient certain preventive protocols, quality metrics, and best practice recommendations. A written personalized care plan for preventive services as well as general preventive health recommendations were provided to patient.   Lolita Libra, CMA   05/02/2024   Return in 1 year (on 05/02/2025).  After Visit Summary: (In Person-Printed) AVS printed and given to the patient  Nurse Notes: see phone note

## 2024-05-02 NOTE — Progress Notes (Signed)
 Subjective:  Patient ID: Thomas Hancock, male    DOB: [birth year],   MRN: [id number]  Chief Complaint  Patient presents with   Wound Check    It's a little bit good, he's in pain.    68 y.o. male presents for follow-up of left heel wound.  Relates very painful.  He relates getting more pain up his leg.  He has been is taking the Bactrim.  He denies diabetes. Currently on chemotherapy . They believe it possibly started as a spider bite. .denies any other pedal complaints. Denies n/v/f/c.   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 CFT <3 seconds. Normal hair growth on digits. No edema.  Skin. No lacerations or abrasions bilateral feet. Left heel ulceration posterior heel with necrosis overlying and tenderness to palpation.  Minimal erythema surrounding.  No purulence noted.  No probe to bone.  Underlying ulceration to fat layer noted. Musculoskeletal: MMT 5/5 bilateral lower extremities in DF, PF, Inversion and Eversion. Deceased ROM in DF of ankle joint.  Neurological: Sensation intact to light touch.   Summary: Right: Resting right ankle-brachial index is within normal range. The right toe-brachial index is normal.   Left: Resting left ankle-brachial index is within normal range. The left toe-brachial index is normal.  Assessment:   1. Ulcer of left heel and midfoot, unspecified ulcer stage (HCC)        Plan:  Patient was evaluated and treated and all questions answered. Ulcer posterior left heel with fat layer exposed  Debridment as below.  -x-rays reviewed No osseous erosions noted.  -Dressed with betadine, DSD. -Off-loading with heel cushion pads advised on where to purchase  -  Betadine dressing changes daily. -1 more week of Bactrim sent to pharmacy. - ABIs reviewed and are normal.  -Discussed glucose control and proper protein-rich diet.  -Oxycodone  sent for pain.  -Discussed if any worsening redness, pain, fever or chills to call or may need to report to the emergency room. Patient expressed understanding.     Procedure: Excisional Debridement of Wound Tool: Sharp #312 chisel blade/tissue nipper Type of Debridement: Sharp Excisional Frequency: Every two weeks until appropriately healed.  Dressing is to be changed daily/keeping the wound clean and dry Rationale: Removal of non-viable soft tissue from the wound to promote healing.  Anesthesia: none Pre-Debridement Wound Measurements: Overlying slough and necrosis  Post-Debridement Wound Measurements: 1 cm x 1.5 cm x 0.2 cm  Area devitalized tissue removed(nonviable tissue only): 1 cm x 1.5 cm.  Blood loss: Minimal (<10cc) Depth of Debridement: with fat layer exposed Description of tissue removed: Slough and Necrotic Technique: The wound and the surrounding skin were prepped and draped in usual aseptic fashion.  Aseptic technique was maintained throughout the procedure.  Using #312 blade/tissue nipper sharp debridement of necrotic/nonviable tissue was performed until healthy bleeding wound bed was achieved.  No underlying bone or tendon was exposed during debridement.  The wound was thoroughly irrigated with normal saline solution Wound Progress:  Current Wound Volume: Debridement was performed of the chronic nonhealing diabetic foot wound on posterior left heel .  Debridement removed 1 cm x 1.5 cm of the necrotic tissue  and subcutaneous tissue and  purulent drainage was not present. Presence/absence of tissue: Necrotic tissue/nonviable tissue present at the base of the wound.  Sharp debridement was performed to remove the necrotic tissue/nonviable tissue back to viable tissue.  No devitalized/nonviable tissue present postdebridement.  Wound appeared clean and clear of infection No material in the wound was present that was identified to be inhibiting healing. Dressing: Dry, sterile, compression dressing. Disposition: Patient tolerated procedure well. Patient to return in 2 weeks for follow-up or as listed above.  Return in about 2 weeks (around 05/16/2024) for wound check.   Return in 2 week for recheck  Asberry Failing, DPM

## 2024-05-02 NOTE — Telephone Encounter (Signed)
 Pt had AWV today.  He scored 28on the 6-cit, could not answer any of the 6-cit questions. He was referred to psychiatry last year for memory testing.  Family says they were told that pt was very anxious and that they needed to get his anxiety under control and return for testing. Daughter says they have not followed up with PCP regarding this and will wait until scheduled follow up in June unless anything changes. Pt had difficulty answering questions during the visit. Specifically could not tell what today's date was and had difficulty recalling his year of birth.

## 2024-05-02 NOTE — Telephone Encounter (Signed)
 Please recommend evaluation prior to June. Obviously he is not mandated to, but this seems more serious than something that can wait that long. Thx.

## 2024-05-02 NOTE — Patient Instructions (Addendum)
 [name],  Thank you for taking the time for your Medicare Wellness Visit. I appreciate your continued commitment to your health goals. Please review the care plan we discussed, and feel free to reach out if I can assist you further.  Please note that Annual Wellness Visits do not include a physical exam. Some assessments may be limited, especially if the visit was conducted virtually. If needed, we may recommend an in-person follow-up with your provider.  Ongoing Care Seeing your primary care provider every 3 to 6 months helps us  monitor your health and provide consistent, personalized care.   Annual Wellness Visit:  05/07/25 8:20am, in person Dr Frann:  12/19/24 3:30pm  Referrals If a referral was made during today's visit and you haven't received any updates within two weeks, please contact the referred provider directly to check on the status.  See List of Providers given to your today to schedule an annual eye exam  Recommended Screenings:  Health Maintenance  Topic Date Due   Medicare Annual Wellness Visit  Never done   COVID-19 Vaccine (4 - 2025-26 season) 02/25/2024   Colon Cancer Screening  11/30/2029   DTaP/Tdap/Td vaccine (2 - Td or Tdap) 10/30/2030   Pneumococcal Vaccine for age over 27  Completed   Flu Shot  Completed   Hepatitis C Screening  Completed   Zoster (Shingles) Vaccine  Completed   Meningitis B Vaccine  Aged Out       05/02/2024    3:06 PM  Advanced Directives  Does Patient Have a Medical Advance Directive? No  Would patient like information on creating a medical advance directive? Yes (MAU/Ambulatory/Procedural Areas - Information given)  Once completed and notarized, you may return a copy of your Advanced Directive(s) by either of the following: Bring a copy of your health care power of attorney and living will to the office to be added to your chart at your convenience. You can mail a copy to [facility] [address]  [place], [place] [address] or email to [email or web address]   Vision: Annual vision screenings are recommended for early detection of glaucoma, cataracts, and diabetic retinopathy. These exams can also reveal signs of chronic conditions such as diabetes and high blood pressure.  Dental: Annual dental screenings help detect early signs of oral cancer, gum disease, and other conditions linked to overall health, including heart disease and diabetes.  Please see the attached documents for additional preventive care recommendations.

## 2024-05-05 NOTE — Telephone Encounter (Signed)
 Left detailed message for pt's daughter to call back and schedule OV to discuss with PCP.  Ok for E2C2 to schedule when pt's family returns call.

## 2024-05-08 DIAGNOSIS — C641 Malignant neoplasm of right kidney, except renal pelvis: Secondary | ICD-10-CM | POA: Diagnosis not present

## 2024-05-13 NOTE — Telephone Encounter (Signed)
 Left detailed message for pt's spouse to call and schedule OV prior to originally scheduled visit in June.

## 2024-05-14 DIAGNOSIS — C641 Malignant neoplasm of right kidney, except renal pelvis: Secondary | ICD-10-CM | POA: Diagnosis not present

## 2024-05-14 DIAGNOSIS — I700 Atherosclerosis of aorta: Secondary | ICD-10-CM | POA: Diagnosis not present

## 2024-05-16 ENCOUNTER — Encounter: Payer: Self-pay | Admitting: Podiatry

## 2024-05-16 ENCOUNTER — Ambulatory Visit: Admitting: Podiatry

## 2024-05-16 VITALS — BP 161/111 | HR 83 | Temp 96.8°F

## 2024-05-16 DIAGNOSIS — L97421 Non-pressure chronic ulcer of left heel and midfoot limited to breakdown of skin: Secondary | ICD-10-CM

## 2024-05-16 NOTE — Progress Notes (Signed)
 Subjective:  Patient ID: Thomas Hancock, male    DOB: [birth year],   MRN: [id number]  Chief Complaint  Patient presents with   Wound Check    Wife stated, It's good but not good.  This one (medial heel) is a new one.    68 y.o. male presents for follow-up of left heel wound.  Relates very painful.  He relates getting more pain up his leg.  He has finished taking the Bactrim .  He denies diabetes. Currently on chemotherapy . They believe it possibly started as a spider bite. .denies any other pedal complaints. Denies n/v/f/c.   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 CFT <3 seconds. Normal hair growth on digits. No edema.  Skin. No lacerations or abrasions bilateral feet. Left heel ulceration posterior heel with necrosis overlying and tenderness to palpation.  Minimal erythema surrounding.  No purulence noted.  No probe to bone.  Underlying ulceration to fat layer noted. Musculoskeletal: MMT 5/5 bilateral lower extremities in DF, PF, Inversion and Eversion. Deceased ROM in DF of ankle joint.  Neurological: Sensation intact to light touch.   Summary: Right: Resting right ankle-brachial index is within normal range. The right toe-brachial index is normal.   Left: Resting left ankle-brachial index is within normal range. The left toe-brachial index is normal.  Assessment:   1. Ulcer of left heel and midfoot,  limited to breakdown of skin (HCC)        Plan:  Patient was evaluated and treated and all questions answered. Ulcer posterior left heel with fat layer exposed , small abrased rashy area noted to medial left heel stable.  Debridment as below.  -x-rays reviewed No osseous erosions noted.  -Dressed with betadine, DSD. -Off-loading with heel cushion pads advised on where to purchase  -  Betadine dressing changes daily. -No abx necessary today.  - ABIs reviewed and are normal.  -Discussed glucose control and proper protein-rich diet.  -Oxycodone  sent for pain.  -Discussed if any worsening redness, pain, fever or chills to call or may need to report to the emergency room. Patient expressed understanding.     Procedure: Excisional Debridement of Wound Tool: Sharp #312 chisel blade/tissue nipper Type of Debridement: Sharp Excisional Frequency: Every two weeks until appropriately healed.  Dressing is to be changed daily/keeping the wound clean and dry Rationale: Removal of non-viable soft tissue from the wound to promote healing.  Anesthesia: none Pre-Debridement Wound Measurements: Overlying slough and necrosis  Post-Debridement Wound Measurements: 0.5cm x 0.5 cm x 0.1 cm  Area devitalized tissue removed(nonviable tissue only): 0.1 cm x 0.5 cm.  Blood loss: Minimal (<10cc) Depth of Debridement: with fat layer exposed Description of tissue removed: Slough and Necrotic Technique: The wound and the surrounding skin were prepped and draped in usual aseptic fashion.  Aseptic technique was maintained throughout the procedure.  Using #312 blade/tissue nipper sharp debridement of necrotic/nonviable tissue was performed  until healthy bleeding wound bed was achieved.  No underlying bone or tendon was exposed during debridement.  The wound was thoroughly irrigated with normal saline solution Wound Progress:  Current Wound Volume: Debridement was performed of the chronic nonhealing diabetic foot  wound on posterior left heel .  Debridement removed 0.1 cm x 0.5 cm of the necrotic tissue and subcutaneous tissue and  purulent drainage was not present. Presence/absence of tissue: Necrotic tissue/nonviable tissue present at the base of the wound.  Sharp debridement was performed to remove the necrotic tissue/nonviable tissue back to viable tissue.  No devitalized/nonviable tissue present postdebridement.  Wound appeared clean and clear of infection No material in the wound was present that was identified to be inhibiting healing. Dressing: Dry, sterile, compression dressing. Disposition: Patient tolerated procedure well. Patient to return in 2 weeks for follow-up or as listed above.  Return in about 2 weeks (around 05/30/2024) for wound check.   Return in 2 week for recheck  Asberry Failing, DPM

## 2024-05-27 ENCOUNTER — Other Ambulatory Visit: Payer: Self-pay | Admitting: Family Medicine

## 2024-05-27 DIAGNOSIS — F411 Generalized anxiety disorder: Secondary | ICD-10-CM

## 2024-05-30 ENCOUNTER — Ambulatory Visit: Admitting: Podiatry

## 2024-05-30 ENCOUNTER — Ambulatory Visit: Admitting: Family Medicine

## 2024-05-30 ENCOUNTER — Encounter: Payer: Self-pay | Admitting: Podiatry

## 2024-05-30 ENCOUNTER — Encounter: Payer: Self-pay | Admitting: Family Medicine

## 2024-05-30 VITALS — BP 112/72 | HR 99 | Temp 98.0°F | Resp 16 | Ht 73.0 in | Wt 202.8 lb

## 2024-05-30 DIAGNOSIS — L84 Corns and callosities: Secondary | ICD-10-CM

## 2024-05-30 DIAGNOSIS — F0394 Unspecified dementia, unspecified severity, with anxiety: Secondary | ICD-10-CM | POA: Diagnosis not present

## 2024-05-30 MED ORDER — DONEPEZIL HCL 5 MG PO TBDP: 5.0000 mg | ORAL_TABLET | Freq: Every day | ORAL | 1 refills | Status: DC

## 2024-05-30 NOTE — Patient Instructions (Addendum)
 Stay active with word games, puzzles or reading/writing daily.   Give us  2-3 business days to get the results of your labs back.   If you do not hear anything about your referral in the next 1-2 weeks, call our office and ask for an update.  Aim to do some physical exertion for 150 minutes per week. This is typically divided into 5 days per week, 30 minutes per day. The activity should be enough to get your heart rate up. Anything is better than nothing if you have time constraints.  Send me a message in 1-2 weeks with an update. If things are well, we can start a low dose of Seroquel.   Let us  know if you need anything.

## 2024-05-30 NOTE — Progress Notes (Signed)
  Subjective:  Patient ID: Thomas Hancock, male    DOB: 08-19-1955,   MRN: [id number]  No chief complaint on file.   68 y.o. male presents for follow-up of left heel wound.  Relates doing well and pain improving and wound site a lot better .denies any other pedal complaints. Denies n/v/f/c.   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 CFT <3 seconds. Normal hair growth on digits. No edema.  Skin. No lacerations or abrasions bilateral feet. Left heel ulceration posterior heel healed.  No erythema.   No purulence noted.  No probe to bone.   Musculoskeletal: MMT 5/5 bilateral lower extremities in DF, PF, Inversion and Eversion. Deceased ROM in DF of ankle joint.  Neurological: Sensation intact to light touch.   Summary: Right: Resting right ankle-brachial index is within normal range. The right toe-brachial index is normal.   Left: Resting left ankle-brachial index is within normal range. The left toe-brachial index is normal.  Assessment:   1. Ulcer of left heel and midfoot, limited to breakdown of skin (HCC)        Plan:  Patient was evaluated and treated and all questions answered. Ulcer posterior left heel with fat layer exposed-healed , small abrased rashy area noted to medial left heel stable.  Debridment of hyperkeratosis as courtesy with wound essentially healed.  -x-rays reviewed No osseous erosions  noted.  -Dressed with betadine, DSD. -Off-loading with heel cushion pads advised on where to purchase  -No abx necessary today.  - ABIs reviewed and are normal.  -Discussed glucose control and proper protein-rich diet.  -Discussed if any worsening redness, pain, fever or chills to call or may need to report to the emergency room. Patient expressed understanding.     Return as needed per patient request.   Haidar Muse, DPM

## 2024-05-30 NOTE — Progress Notes (Signed)
 Chief Complaint  Patient presents with   Memory Loss    Memory Loss    Subjective: Patient is a 68 y.o. male here for memory issues.  He is here with his daughter helps with history.  Over the past several years, he has had worsening memory.  He no longer drives and it takes him a long time prepare food.  He does not manage his own finances anymore.  He is still able to toilet, bathe, and dress himself.  He no longer works either.  He saw neurology several years ago.  He is not taking any medication other than Zoloft .  We increased his dosage recently but it did not seem to help with his mood.  There is anxiety associated with this.  Formal evaluation with the neuropsychology team revealed mild cognitive impairment.  Things have steadily worsened since then.  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  Alert and oriented to person and he knew this is a doctor's office.  He also knows that it is 2025.  He did not know my name, the city/state we are in, the president, the day of the week, or the month. Psych: Limited judgment and insight, normal affect and mood  Assessment and Plan: Dementia  with anxiety, unspecified dementia severity, unspecified dementia type (HCC) - Plan: Ambulatory referral to Neurology, donepezil  (ARICEPT  ODT) 5 MG disintegrating tablet, TSH, B12  Chronic, not controlled.  He is having functional difficulties at this time progressing and for mild cognitive impairment to more dementia.  Will start Aricept  5 mg nightly and refer him back to the neurology team.  Check TSH and B12 for thoroughness.  Daughter will send a message in a couple weeks to let me know how he is doing.  If progressing well, I will add Seroquel 25 mg nightly.  Continue Zoloft  for now. The patient and his daughter voiced understanding and agreement to the plan.  Mabel [place], DO 05/30/24  3:36 PM

## 2024-05-31 LAB — TSH: TSH: 1.12 m[IU]/L (ref 0.40–4.50)

## 2024-05-31 LAB — VITAMIN B12: Vitamin B-12: 1749 pg/mL — ABNORMAL HIGH (ref 200–1100)

## 2024-06-01 ENCOUNTER — Ambulatory Visit: Payer: Self-pay | Admitting: Family Medicine

## 2024-06-09 DIAGNOSIS — C649 Malignant neoplasm of unspecified kidney, except renal pelvis: Secondary | ICD-10-CM | POA: Diagnosis not present

## 2024-07-04 ENCOUNTER — Encounter: Payer: Self-pay | Admitting: Family Medicine

## 2024-07-04 ENCOUNTER — Ambulatory Visit: Admitting: Family Medicine

## 2024-07-04 VITALS — BP 110/70 | HR 87 | Temp 98.0°F | Resp 16 | Ht 73.0 in | Wt 198.2 lb

## 2024-07-04 DIAGNOSIS — F0394 Unspecified dementia, unspecified severity, with anxiety: Secondary | ICD-10-CM | POA: Diagnosis not present

## 2024-07-04 MED ORDER — CITALOPRAM HYDROBROMIDE 20 MG PO TABS: 20.0000 mg | ORAL_TABLET | Freq: Every day | ORAL | 1 refills | Status: AC

## 2024-07-04 MED ORDER — DONEPEZIL HCL 10 MG PO TBDP: 10.0000 mg | ORAL_TABLET | Freq: Every day | ORAL | 1 refills | Status: AC

## 2024-07-04 NOTE — Patient Instructions (Signed)
 Aim to do some physical exertion for 150 minutes per week. This is typically divided into 5 days per week, 30 minutes per day. The activity should be enough to get your heart rate up. Anything is better than nothing if you have time constraints.  Send me a message in 2 weeks with an update of how we are doing on the new medication and dosage.  Let us  know if you need anything.

## 2024-07-04 NOTE — Progress Notes (Addendum)
 Chief Complaint  Patient presents with   Follow-up    Follow Up    Subjective: Patient is a 69 y.o. male here for f/u. Here w daughter who helps w hx.   Started on Aricept  5 mg/d. Compliant, no AE's. Did not notice much difference. Has appt w Neuro in a couple months. Mood is stable, but he is irritable. Taking Zoloft  75 mg/d. Did not notice a large difference when dosage was increased. Has not been on anything else. No depression.   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   Renal cell cancer, right 10/16/2019   Right renal mass    Silent micro-hemorrhage of brain    right parietal   Syncope and collapse 12/16/2020   Vitamin B12 deficiency     Objective: BP 110/70 (BP Location: Left Arm, Patient Position: Sitting)   Pulse 87   Temp 98 F (36.7 C) (Oral)   Resp 16   Ht 6' 1 (1.854 m)   Wt 198 lb 3.2 oz (89.9 kg)   SpO2 96%   BMI 26.15 kg/m  General: Awake, appears stated age Neuro: Alert to first name, aware this is some sort of medical facility; does not know time, does know president, does not know me Lungs: No accessory muscle use Psych: normal affect and mood  Assessment and Plan: Dementia with anxiety, unspecified dementia severity, unspecified dementia type (HCC) - Plan: donepezil  (ARICEPT  ODT) 10 MG disintegrating tablet, citalopram  (CELEXA ) 20 MG tablet  Chronic, not controlled. Increase Aricept  to 10 mg/d. Change Zoloft  to Celexa  20 mg/d. F/u in 1 mo. Send message in 2 weeks and we will add Memantine.  The patient and his daughter voiced understanding and agreement to the plan.  Mabel [place], DO 07/04/2024  4:06  PM

## 2024-07-04 NOTE — Addendum Note (Signed)
 Addended by: FRANN MABEL SQUIBB on: 07/04/2024 04:06 PM   Modules accepted: Level of Service

## 2024-07-09 ENCOUNTER — Other Ambulatory Visit: Payer: Self-pay

## 2024-07-09 ENCOUNTER — Inpatient Hospital Stay: Attending: Hematology & Oncology

## 2024-07-09 ENCOUNTER — Inpatient Hospital Stay (HOSPITAL_BASED_OUTPATIENT_CLINIC_OR_DEPARTMENT_OTHER): Admitting: Hematology & Oncology

## 2024-07-09 DIAGNOSIS — C641 Malignant neoplasm of right kidney, except renal pelvis: Secondary | ICD-10-CM

## 2024-07-09 DIAGNOSIS — D45 Polycythemia vera: Secondary | ICD-10-CM

## 2024-07-09 LAB — IRON AND IRON BINDING CAPACITY (CC-WL,HP ONLY)
Iron: 100 ug/dL (ref 45–182)
Saturation Ratios: 26 % (ref 17.9–39.5)
TIBC: 381 ug/dL (ref 250–450)
UIBC: 281 ug/dL

## 2024-07-09 LAB — RETICULOCYTES
Immature Retic Fract: 19.1 % — ABNORMAL HIGH (ref 2.3–15.9)
RBC.: 3.62 MIL/uL — ABNORMAL LOW (ref 4.22–5.81)
Retic Count, Absolute: 64.4 K/uL (ref 19.0–186.0)
Retic Ct Pct: 1.8 % (ref 0.4–3.1)

## 2024-07-09 LAB — CBC WITH DIFFERENTIAL (CANCER CENTER ONLY)
Abs Immature Granulocytes: 0.01 K/uL (ref 0.00–0.07)
Basophils Absolute: 0.1 K/uL (ref 0.0–0.1)
Basophils Relative: 2 %
Eosinophils Absolute: 0.1 K/uL (ref 0.0–0.5)
Eosinophils Relative: 2 %
HCT: 41.4 % (ref 39.0–52.0)
Hemoglobin: 14.5 g/dL (ref 13.0–17.0)
Immature Granulocytes: 0 %
Lymphocytes Relative: 21 %
Lymphs Abs: 0.8 K/uL (ref 0.7–4.0)
MCH: 39.3 pg — ABNORMAL HIGH (ref 26.0–34.0)
MCHC: 35 g/dL (ref 30.0–36.0)
MCV: 112.2 fL — ABNORMAL HIGH (ref 80.0–100.0)
Monocytes Absolute: 0.3 K/uL (ref 0.1–1.0)
Monocytes Relative: 9 %
Neutro Abs: 2.6 K/uL (ref 1.7–7.7)
Neutrophils Relative %: 66 %
Platelet Count: 202 K/uL (ref 150–400)
RBC: 3.69 MIL/uL — ABNORMAL LOW (ref 4.22–5.81)
RDW: 12.1 % (ref 11.5–15.5)
WBC Count: 3.9 K/uL — ABNORMAL LOW (ref 4.0–10.5)
nRBC: 0 % (ref 0.0–0.2)

## 2024-07-09 LAB — CMP (CANCER CENTER ONLY)
ALT: 23 U/L (ref 0–44)
AST: 26 U/L (ref 15–41)
Albumin: 4.8 g/dL (ref 3.5–5.0)
Alkaline Phosphatase: 93 U/L (ref 38–126)
Anion gap: 13 (ref 5–15)
BUN: 16 mg/dL (ref 8–23)
CO2: 26 mmol/L (ref 22–32)
Calcium: 9.6 mg/dL (ref 8.9–10.3)
Chloride: 103 mmol/L (ref 98–111)
Creatinine: 1 mg/dL (ref 0.61–1.24)
GFR, Estimated: >60 mL/min
Glucose, Bld: 168 mg/dL — ABNORMAL HIGH (ref 70–99)
Potassium: 4 mmol/L (ref 3.5–5.1)
Sodium: 142 mmol/L (ref 135–145)
Total Bilirubin: 0.7 mg/dL (ref 0.0–1.2)
Total Protein: 8.4 g/dL — ABNORMAL HIGH (ref 6.5–8.1)

## 2024-07-09 LAB — FERRITIN: Ferritin: 42 ng/mL (ref 24–336)

## 2024-07-09 NOTE — Progress Notes (Signed)
 " Hematology and Oncology Follow Up Visit  Thomas Hancock [id number] 03-29-1956 69 y.o. 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 daily-changed on 07/09/2024 Phlebotomy as indicated to maintain ferritin < 100 and iron saturation < 50% Phlebotomy to keep hematocrit below 45% EC ASA 81 mg po q day   Interim History:  Thomas Hancock is here today for follow-up.  He is doing a little bit better.  The last he had the infection on the back of the right foot.  He went to see Podiatry.  They did a wonderful job.  They cleaned out the wound.  He was on antibiotics.  This wound is healed up nicely.  He still having some pain with this.  This pain is affected his cognitive state, according to his wife.  He did have a nice Holiday season.  They spend a lot of time with their children and grandson.  He has had no bleeding.  He has had no diarrhea.  He has had no cough or shortness of breath.  He apparently had a CT scan at Urology and this was for his kidney cancer surveillance.  This looked good according to the wife.  When we last saw him back in October, his ferritin was 63 and iron saturation of 22%.  As such, he has had no issues with the hemochromatosis.  Overall, I would have to say that his performance status is probably ECOG 2.    Medications:  Allergies as of 07/09/2024   No Known Allergies      Medication List        Accurate as of July 09, 2024 12:44 PM. If you have any questions, ask your nurse or doctor.          acetaminophen  500 MG tablet Commonly known as: TYLENOL  Take 500 mg by mouth every 6 (six) hours as needed.   ASPIRIN 81 PO Take 1 tablet by mouth daily.   atorvastatin  10 MG tablet Commonly known as: LIPITOR TAKE 1 TABLET  DAILY   B-12 PO Take 1 tablet by mouth daily. Unknown strength   citalopram  20 MG tablet Commonly known as: CELEXA  Take 1 tablet (20 mg total) by mouth daily.   donepezil  10 MG disintegrating tablet Commonly known as: ARICEPT  ODT Take 1 tablet (10 mg total) by mouth at bedtime.   hydroxyurea  500 MG capsule Commonly known as: HYDREA  TAKE 1 CAPSULE TWICE A DAY WITH MEALS TO MINIMIZE GASTROINTESTINAL SIDE EFFECTS   MULTIVITAMIN ADULTS PO Take by mouth daily.   mupirocin  ointment 2 % Commonly known as: BACTROBAN  Apply 1 Application topically 2 (two) times daily.        Allergies: No Known Allergies  Past Medical History, Surgical history, Social history, and Family History were reviewed and updated.  Review of Systems: Review of Systems  Constitutional: Negative.   HENT: Negative.    Eyes: Negative.   Respiratory: Negative.    Cardiovascular: Negative.   Gastrointestinal:  Positive for abdominal pain.  Genitourinary: Negative.   Musculoskeletal: Negative.   Skin: Negative.   Neurological: Negative.   Endo/Heme/Allergies: Negative.   Psychiatric/Behavioral: Negative.       Physical Exam:  Vital signs are temperature of 98.  Pulse 87.  Blood pressure 110/70.  Weight is 198  pounds.  Wt Readings from Last 3 Encounters:  07/04/24 198 lb 3.2 oz (89.9 kg)  05/30/24 202 lb 12.8 oz (92 kg)  04/09/24 197 lb 1.9 oz (89.4 kg)    Physical Exam Vitals reviewed.  HENT:     Head: Normocephalic and atraumatic.  Eyes:     Pupils: Pupils are equal, round, and reactive to light.  Cardiovascular:     Rate and Rhythm: Normal rate and regular rhythm.     Heart sounds: Normal heart sounds.  Pulmonary:     Effort: Pulmonary effort is normal.     Breath sounds: Normal breath sounds.  Abdominal:     General: Bowel sounds are normal.     Palpations: Abdomen is soft.     Comments: Abdominal exam shows healing laparoscopy scars.  I think he has 5 scars.  They are well-healed.   There is no erythema.  There is no exudate.  His abdomen is slightly distended.  He has slightly decreased bowel sounds.  There is no guarding.  He has no palpable abdominal mass.  Is no palpable liver or spleen tip.  Musculoskeletal:        General: No tenderness or deformity. Normal range of motion.     Cervical back: Normal range of motion.     Comments: In his right heel, he has healed wound.  He has a little bit of excoriation.  I do not see any fluctuance.  There is no erythema.  There may be a little bit of tenderness to palpation.   Lymphadenopathy:     Cervical: No cervical adenopathy.  Skin:    General: Skin is warm and dry.     Findings: No erythema or rash.  Neurological:     Mental Status: He is alert and oriented to person, place, and time.  Psychiatric:        Behavior: Behavior normal.        Thought Content: Thought content normal.        Judgment: Judgment normal.      Lab Results  Component Value Date   WBC 3.9 (L) 07/09/2024   HGB 14.5 07/09/2024   HCT 41.4 07/09/2024   MCV 112.2 (H) 07/09/2024   PLT 202 07/09/2024   Lab Results  Component Value Date   FERRITIN 63 04/09/2024   IRON 83 04/09/2024   TIBC 377 04/09/2024   UIBC 294 04/09/2024   IRONPCTSAT 22 04/09/2024   Lab Results  Component Value Date   RETICCTPCT 1.8 07/09/2024   RBC 3.62 (L) 07/09/2024   RBC 3.69 (L) 07/09/2024   No results found for: KPAFRELGTCHN, LAMBDASER, KAPLAMBRATIO No results found for: IGGSERUM, IGA, IGMSERUM No results found for: STEPHANY RINGS, A1GS, MARKEL EARLA JOANNIE DOC VICK, SPEI   Chemistry      Component Value Date/Time   NA 142 07/09/2024 1154   K 4.0 07/09/2024 1154   CL 103 07/09/2024 1154   CO2 26 07/09/2024 1154   BUN 16 07/09/2024 1154   CREATININE 1.00 07/09/2024 1154      Component Value Date/Time   CALCIUM  9.6 07/09/2024 1154   ALKPHOS 93 07/09/2024 1154   AST 26 07/09/2024 1154   ALT 23 07/09/2024  1154   BILITOT 0.7 07/09/2024 1154       Impression and Plan: Thomas Hancock is a very pleasant 69 yo Pakistani gentleman with Polycythemia vera -  JAK 2 positive; 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  He does  not need to be phlebotomized today.  I would imagine that his iron studies are also okay.  As such, I do not see a problem with respect to his hemochromatosis.  Again, Urology is following his renal cell carcinoma.    We will plan to have him back to see us  in about 3 months.  I think this would be reasonable.  I am going to change his Hydrea  dose.  I would like for him to take 500 mg a day alternating with a 1000 mg a day.  His white cell count is a little bit on the low side.  I want to make sure that the white cells do not get any lower.    Maude JONELLE Crease, MD 1/14/202612:44 PM "

## 2024-07-21 ENCOUNTER — Encounter: Payer: Self-pay | Admitting: Family

## 2024-07-28 ENCOUNTER — Ambulatory Visit: Admitting: Physician Assistant

## 2024-08-08 ENCOUNTER — Ambulatory Visit: Admitting: Family Medicine

## 2024-08-14 ENCOUNTER — Ambulatory Visit: Admitting: Physician Assistant

## 2024-09-11 ENCOUNTER — Ambulatory Visit: Admitting: Diagnostic Neuroimaging

## 2024-09-24 ENCOUNTER — Inpatient Hospital Stay: Admitting: Hematology & Oncology

## 2024-09-24 ENCOUNTER — Inpatient Hospital Stay

## 2024-12-19 ENCOUNTER — Encounter: Admitting: Family Medicine

## 2025-05-07 ENCOUNTER — Ambulatory Visit
